# Patient Record
Sex: Male | Born: 1970 | Race: Black or African American | Hispanic: No | Marital: Single | Smoking: Never smoker
Health system: Southern US, Community
[De-identification: ages and names within clinical notes are randomized; demographics above are authoritative.]

## PROBLEM LIST (undated history)

## (undated) DIAGNOSIS — B1910 Unspecified viral hepatitis B without hepatic coma: Secondary | ICD-10-CM

## (undated) DIAGNOSIS — A539 Syphilis, unspecified: Secondary | ICD-10-CM

## (undated) DIAGNOSIS — E785 Hyperlipidemia, unspecified: Secondary | ICD-10-CM

## (undated) DIAGNOSIS — F329 Major depressive disorder, single episode, unspecified: Secondary | ICD-10-CM

## (undated) DIAGNOSIS — B20 Human immunodeficiency virus [HIV] disease: Secondary | ICD-10-CM

## (undated) DIAGNOSIS — F32A Depression, unspecified: Secondary | ICD-10-CM

## (undated) DIAGNOSIS — E119 Type 2 diabetes mellitus without complications: Secondary | ICD-10-CM

## (undated) DIAGNOSIS — I10 Essential (primary) hypertension: Secondary | ICD-10-CM

## (undated) DIAGNOSIS — Z21 Asymptomatic human immunodeficiency virus [HIV] infection status: Secondary | ICD-10-CM

## (undated) HISTORY — DX: Depression, unspecified: F32.A

## (undated) HISTORY — DX: Major depressive disorder, single episode, unspecified: F32.9

## (undated) HISTORY — DX: Human immunodeficiency virus (HIV) disease: B20

## (undated) HISTORY — DX: Unspecified viral hepatitis B without hepatic coma: B19.10

## (undated) HISTORY — DX: Essential (primary) hypertension: I10

## (undated) HISTORY — DX: Hyperlipidemia, unspecified: E78.5

## (undated) HISTORY — DX: Asymptomatic human immunodeficiency virus (hiv) infection status: Z21

## (undated) HISTORY — DX: Syphilis, unspecified: A53.9

---

## 1997-05-17 ENCOUNTER — Encounter (INDEPENDENT_AMBULATORY_CARE_PROVIDER_SITE_OTHER): Payer: Self-pay | Admitting: *Deleted

## 1997-05-17 LAB — CONVERTED CEMR LAB: CD4 Count: 520 microliters

## 1997-08-16 ENCOUNTER — Encounter: Admission: RE | Admit: 1997-08-16 | Discharge: 1997-08-16 | Payer: Self-pay | Admitting: Internal Medicine

## 1997-10-25 ENCOUNTER — Encounter: Admission: RE | Admit: 1997-10-25 | Discharge: 1997-10-25 | Payer: Self-pay | Admitting: Hematology and Oncology

## 1998-02-01 ENCOUNTER — Encounter: Admission: RE | Admit: 1998-02-01 | Discharge: 1998-02-01 | Payer: Self-pay | Admitting: Internal Medicine

## 1998-08-16 DIAGNOSIS — E1065 Type 1 diabetes mellitus with hyperglycemia: Secondary | ICD-10-CM

## 1998-08-16 DIAGNOSIS — E108 Type 1 diabetes mellitus with unspecified complications: Secondary | ICD-10-CM

## 1998-08-23 ENCOUNTER — Ambulatory Visit (HOSPITAL_COMMUNITY): Admission: RE | Admit: 1998-08-23 | Discharge: 1998-08-23 | Payer: Self-pay | Admitting: Internal Medicine

## 1998-08-23 ENCOUNTER — Encounter: Admission: RE | Admit: 1998-08-23 | Discharge: 1998-08-23 | Payer: Self-pay | Admitting: Internal Medicine

## 1998-10-01 ENCOUNTER — Emergency Department (HOSPITAL_COMMUNITY): Admission: EM | Admit: 1998-10-01 | Discharge: 1998-10-01 | Payer: Self-pay | Admitting: Emergency Medicine

## 2000-01-16 ENCOUNTER — Ambulatory Visit (HOSPITAL_COMMUNITY): Admission: RE | Admit: 2000-01-16 | Discharge: 2000-01-16 | Payer: Self-pay | Admitting: Hematology and Oncology

## 2000-01-16 ENCOUNTER — Encounter: Admission: RE | Admit: 2000-01-16 | Discharge: 2000-01-16 | Payer: Self-pay

## 2000-01-30 ENCOUNTER — Encounter: Admission: RE | Admit: 2000-01-30 | Discharge: 2000-01-30 | Payer: Self-pay | Admitting: Hematology and Oncology

## 2000-03-05 ENCOUNTER — Encounter: Admission: RE | Admit: 2000-03-05 | Discharge: 2000-03-05 | Payer: Self-pay | Admitting: Internal Medicine

## 2000-05-18 ENCOUNTER — Encounter: Admission: RE | Admit: 2000-05-18 | Discharge: 2000-05-18 | Payer: Self-pay | Admitting: Internal Medicine

## 2000-05-18 ENCOUNTER — Ambulatory Visit (HOSPITAL_COMMUNITY): Admission: RE | Admit: 2000-05-18 | Discharge: 2000-05-18 | Payer: Self-pay | Admitting: Internal Medicine

## 2000-10-30 ENCOUNTER — Encounter: Payer: Self-pay | Admitting: Infectious Diseases

## 2000-10-30 ENCOUNTER — Encounter: Admission: RE | Admit: 2000-10-30 | Discharge: 2000-10-30 | Payer: Self-pay | Admitting: Internal Medicine

## 2000-10-30 ENCOUNTER — Ambulatory Visit (HOSPITAL_COMMUNITY): Admission: RE | Admit: 2000-10-30 | Discharge: 2000-10-30 | Payer: Self-pay | Admitting: Internal Medicine

## 2001-03-31 ENCOUNTER — Encounter: Admission: RE | Admit: 2001-03-31 | Discharge: 2001-03-31 | Payer: Self-pay

## 2001-03-31 ENCOUNTER — Ambulatory Visit (HOSPITAL_COMMUNITY): Admission: RE | Admit: 2001-03-31 | Discharge: 2001-03-31 | Payer: Self-pay

## 2001-03-31 ENCOUNTER — Encounter: Payer: Self-pay | Admitting: Infectious Diseases

## 2001-12-03 ENCOUNTER — Encounter: Admission: RE | Admit: 2001-12-03 | Discharge: 2001-12-03 | Payer: Self-pay | Admitting: Internal Medicine

## 2001-12-15 ENCOUNTER — Encounter: Admission: RE | Admit: 2001-12-15 | Discharge: 2001-12-15 | Payer: Self-pay | Admitting: Internal Medicine

## 2001-12-15 ENCOUNTER — Ambulatory Visit (HOSPITAL_COMMUNITY): Admission: RE | Admit: 2001-12-15 | Discharge: 2001-12-15 | Payer: Self-pay | Admitting: Internal Medicine

## 2002-05-03 ENCOUNTER — Encounter: Admission: RE | Admit: 2002-05-03 | Discharge: 2002-05-03 | Payer: Self-pay | Admitting: Internal Medicine

## 2002-05-09 ENCOUNTER — Ambulatory Visit (HOSPITAL_COMMUNITY): Admission: RE | Admit: 2002-05-09 | Discharge: 2002-05-09 | Payer: Self-pay | Admitting: *Deleted

## 2002-05-09 ENCOUNTER — Encounter: Admission: RE | Admit: 2002-05-09 | Discharge: 2002-05-09 | Payer: Self-pay | Admitting: Internal Medicine

## 2002-05-09 ENCOUNTER — Encounter: Payer: Self-pay | Admitting: *Deleted

## 2002-06-28 ENCOUNTER — Encounter: Admission: RE | Admit: 2002-06-28 | Discharge: 2002-06-28 | Payer: Self-pay | Admitting: Internal Medicine

## 2002-07-04 ENCOUNTER — Emergency Department (HOSPITAL_COMMUNITY): Admission: EM | Admit: 2002-07-04 | Discharge: 2002-07-04 | Payer: Self-pay | Admitting: Emergency Medicine

## 2002-07-12 ENCOUNTER — Encounter: Admission: RE | Admit: 2002-07-12 | Discharge: 2002-07-12 | Payer: Self-pay | Admitting: Internal Medicine

## 2002-07-15 ENCOUNTER — Encounter: Payer: Self-pay | Admitting: Internal Medicine

## 2002-07-15 ENCOUNTER — Ambulatory Visit (HOSPITAL_COMMUNITY): Admission: RE | Admit: 2002-07-15 | Discharge: 2002-07-15 | Payer: Self-pay | Admitting: Internal Medicine

## 2002-07-19 ENCOUNTER — Encounter: Admission: RE | Admit: 2002-07-19 | Discharge: 2002-07-19 | Payer: Self-pay | Admitting: Internal Medicine

## 2002-07-19 ENCOUNTER — Encounter: Admission: RE | Admit: 2002-07-19 | Discharge: 2002-10-17 | Payer: Self-pay | Admitting: *Deleted

## 2002-08-03 ENCOUNTER — Encounter: Admission: RE | Admit: 2002-08-03 | Discharge: 2002-08-03 | Payer: Self-pay | Admitting: Internal Medicine

## 2002-09-07 ENCOUNTER — Encounter: Admission: RE | Admit: 2002-09-07 | Discharge: 2002-09-07 | Payer: Self-pay | Admitting: Infectious Diseases

## 2002-09-07 ENCOUNTER — Encounter: Payer: Self-pay | Admitting: Infectious Diseases

## 2002-10-05 ENCOUNTER — Ambulatory Visit (HOSPITAL_COMMUNITY): Admission: RE | Admit: 2002-10-05 | Discharge: 2002-10-05 | Payer: Self-pay | Admitting: *Deleted

## 2002-10-05 ENCOUNTER — Encounter (INDEPENDENT_AMBULATORY_CARE_PROVIDER_SITE_OTHER): Payer: Self-pay | Admitting: *Deleted

## 2003-01-04 ENCOUNTER — Encounter: Admission: RE | Admit: 2003-01-04 | Discharge: 2003-01-04 | Payer: Self-pay | Admitting: Infectious Diseases

## 2003-01-04 ENCOUNTER — Ambulatory Visit (HOSPITAL_COMMUNITY): Admission: RE | Admit: 2003-01-04 | Discharge: 2003-01-04 | Payer: Self-pay | Admitting: Infectious Diseases

## 2003-01-04 ENCOUNTER — Encounter: Payer: Self-pay | Admitting: Infectious Diseases

## 2003-03-03 ENCOUNTER — Encounter: Admission: RE | Admit: 2003-03-03 | Discharge: 2003-03-03 | Payer: Self-pay | Admitting: Internal Medicine

## 2003-06-07 ENCOUNTER — Encounter: Admission: RE | Admit: 2003-06-07 | Discharge: 2003-06-07 | Payer: Self-pay | Admitting: Internal Medicine

## 2003-07-17 ENCOUNTER — Encounter: Admission: RE | Admit: 2003-07-17 | Discharge: 2003-07-17 | Payer: Self-pay | Admitting: Infectious Diseases

## 2003-07-17 ENCOUNTER — Ambulatory Visit (HOSPITAL_COMMUNITY): Admission: RE | Admit: 2003-07-17 | Discharge: 2003-07-17 | Payer: Self-pay | Admitting: Infectious Diseases

## 2004-02-05 ENCOUNTER — Ambulatory Visit: Payer: Self-pay | Admitting: Infectious Diseases

## 2004-02-05 ENCOUNTER — Ambulatory Visit (HOSPITAL_COMMUNITY): Admission: RE | Admit: 2004-02-05 | Discharge: 2004-02-05 | Payer: Self-pay | Admitting: Infectious Diseases

## 2004-04-08 ENCOUNTER — Encounter (INDEPENDENT_AMBULATORY_CARE_PROVIDER_SITE_OTHER): Payer: Self-pay | Admitting: *Deleted

## 2004-04-08 LAB — CONVERTED CEMR LAB: HIV 1 RNA Quant: 399 copies/mL

## 2004-05-09 ENCOUNTER — Ambulatory Visit (HOSPITAL_COMMUNITY): Admission: RE | Admit: 2004-05-09 | Discharge: 2004-05-09 | Payer: Self-pay | Admitting: Internal Medicine

## 2004-05-09 ENCOUNTER — Encounter: Payer: Self-pay | Admitting: Infectious Diseases

## 2004-05-09 ENCOUNTER — Ambulatory Visit: Payer: Self-pay | Admitting: Internal Medicine

## 2004-09-15 ENCOUNTER — Emergency Department (HOSPITAL_COMMUNITY): Admission: EM | Admit: 2004-09-15 | Discharge: 2004-09-15 | Payer: Self-pay | Admitting: Emergency Medicine

## 2005-03-03 ENCOUNTER — Emergency Department (HOSPITAL_COMMUNITY): Admission: EM | Admit: 2005-03-03 | Discharge: 2005-03-04 | Payer: Self-pay | Admitting: Emergency Medicine

## 2005-10-06 ENCOUNTER — Encounter (INDEPENDENT_AMBULATORY_CARE_PROVIDER_SITE_OTHER): Payer: Self-pay | Admitting: *Deleted

## 2005-10-06 ENCOUNTER — Ambulatory Visit: Payer: Self-pay | Admitting: Infectious Diseases

## 2005-10-06 ENCOUNTER — Encounter: Admission: RE | Admit: 2005-10-06 | Discharge: 2005-10-06 | Payer: Self-pay | Admitting: Infectious Diseases

## 2005-10-06 LAB — CONVERTED CEMR LAB
CD4 Count: 500 microliters
HIV 1 RNA Quant: 399 copies/mL

## 2006-02-23 ENCOUNTER — Ambulatory Visit: Payer: Self-pay | Admitting: Infectious Diseases

## 2006-02-23 ENCOUNTER — Encounter (INDEPENDENT_AMBULATORY_CARE_PROVIDER_SITE_OTHER): Payer: Self-pay | Admitting: *Deleted

## 2006-02-23 ENCOUNTER — Encounter: Admission: RE | Admit: 2006-02-23 | Discharge: 2006-02-23 | Payer: Self-pay | Admitting: Infectious Diseases

## 2006-02-23 LAB — CONVERTED CEMR LAB
CD4 Count: 650 microliters
HIV 1 RNA Quant: 49 copies/mL

## 2006-02-24 DIAGNOSIS — Z8619 Personal history of other infectious and parasitic diseases: Secondary | ICD-10-CM

## 2006-02-24 DIAGNOSIS — I10 Essential (primary) hypertension: Secondary | ICD-10-CM | POA: Insufficient documentation

## 2006-02-24 DIAGNOSIS — E785 Hyperlipidemia, unspecified: Secondary | ICD-10-CM | POA: Insufficient documentation

## 2006-02-24 DIAGNOSIS — B20 Human immunodeficiency virus [HIV] disease: Secondary | ICD-10-CM | POA: Insufficient documentation

## 2006-05-11 ENCOUNTER — Encounter (INDEPENDENT_AMBULATORY_CARE_PROVIDER_SITE_OTHER): Payer: Self-pay | Admitting: *Deleted

## 2006-05-11 LAB — CONVERTED CEMR LAB

## 2006-05-24 ENCOUNTER — Encounter (INDEPENDENT_AMBULATORY_CARE_PROVIDER_SITE_OTHER): Payer: Self-pay | Admitting: *Deleted

## 2007-01-04 ENCOUNTER — Telehealth: Payer: Self-pay | Admitting: Infectious Diseases

## 2007-03-16 ENCOUNTER — Telehealth: Payer: Self-pay | Admitting: Infectious Diseases

## 2007-04-01 ENCOUNTER — Encounter: Admission: RE | Admit: 2007-04-01 | Discharge: 2007-04-01 | Payer: Self-pay | Admitting: Infectious Diseases

## 2007-04-01 ENCOUNTER — Ambulatory Visit: Payer: Self-pay | Admitting: Infectious Diseases

## 2007-04-01 LAB — CONVERTED CEMR LAB
AST: 49 units/L — ABNORMAL HIGH (ref 0–37)
BUN: 10 mg/dL (ref 6–23)
Basophils Absolute: 0 10*3/uL (ref 0.0–0.1)
Basophils Relative: 0 % (ref 0–1)
Calcium: 9 mg/dL (ref 8.4–10.5)
Chloride: 100 meq/L (ref 96–112)
Cholesterol: 162 mg/dL (ref 0–200)
Creatinine, Ser: 1.08 mg/dL (ref 0.40–1.50)
Eosinophils Relative: 2 % (ref 0–5)
HCT: 42.1 % (ref 39.0–52.0)
HDL: 37 mg/dL — ABNORMAL LOW (ref >39)
Hemoglobin: 13.3 g/dL (ref 13.0–17.0)
MCHC: 31.6 g/dL (ref 30.0–36.0)
MCV: 77.2 fL — ABNORMAL LOW (ref 78.0–100.0)
Monocytes Absolute: 0.4 10*3/uL (ref 0.1–1.0)
Monocytes Relative: 8 % (ref 3–12)
Neutro Abs: 2.3 10*3/uL (ref 1.7–7.7)
RBC: 5.45 M/uL (ref 4.22–5.81)
RDW: 14.4 % (ref 11.5–15.5)
Total Bilirubin: 0.3 mg/dL (ref 0.3–1.2)
Total CHOL/HDL Ratio: 4.4
VLDL: 78 mg/dL — ABNORMAL HIGH (ref 0–40)

## 2007-04-05 ENCOUNTER — Telehealth: Payer: Self-pay | Admitting: Infectious Diseases

## 2007-04-07 ENCOUNTER — Ambulatory Visit: Payer: Self-pay | Admitting: Internal Medicine

## 2007-04-07 DIAGNOSIS — B009 Herpesviral infection, unspecified: Secondary | ICD-10-CM | POA: Insufficient documentation

## 2007-05-03 ENCOUNTER — Encounter: Payer: Self-pay | Admitting: Infectious Diseases

## 2007-05-06 ENCOUNTER — Telehealth: Payer: Self-pay | Admitting: Internal Medicine

## 2007-07-20 ENCOUNTER — Telehealth: Payer: Self-pay | Admitting: Infectious Diseases

## 2007-09-24 ENCOUNTER — Encounter: Admission: RE | Admit: 2007-09-24 | Discharge: 2007-09-24 | Payer: Self-pay | Admitting: Infectious Diseases

## 2007-09-24 ENCOUNTER — Ambulatory Visit: Payer: Self-pay | Admitting: Infectious Diseases

## 2007-09-24 LAB — CONVERTED CEMR LAB
Albumin: 4.4 g/dL (ref 3.5–5.2)
Alkaline Phosphatase: 81 units/L (ref 39–117)
BUN: 12 mg/dL (ref 6–23)
Basophils Relative: 0 % (ref 0–1)
Creatinine, Ser: 0.83 mg/dL (ref 0.40–1.50)
Eosinophils Absolute: 0.1 10*3/uL (ref 0.0–0.7)
Eosinophils Relative: 2 % (ref 0–5)
Glucose, Bld: 265 mg/dL — ABNORMAL HIGH (ref 70–99)
HCT: 42.4 % (ref 39.0–52.0)
HIV-1 RNA Quant, Log: <1.7 (ref <1.70)
Lymphs Abs: 2.1 10*3/uL (ref 0.7–4.0)
MCHC: 31.8 g/dL (ref 30.0–36.0)
MCV: 77.2 fL — ABNORMAL LOW (ref 78.0–100.0)
Monocytes Absolute: 0.4 10*3/uL (ref 0.1–1.0)
Monocytes Relative: 8 % (ref 3–12)
RBC: 5.49 M/uL (ref 4.22–5.81)
Total Bilirubin: 0.4 mg/dL (ref 0.3–1.2)
WBC: 4.7 10*3/uL (ref 4.0–10.5)

## 2007-09-27 ENCOUNTER — Ambulatory Visit: Payer: Self-pay | Admitting: Infectious Diseases

## 2007-12-07 ENCOUNTER — Telehealth: Payer: Self-pay | Admitting: Infectious Diseases

## 2007-12-08 ENCOUNTER — Telehealth: Payer: Self-pay | Admitting: Infectious Diseases

## 2007-12-08 ENCOUNTER — Encounter: Payer: Self-pay | Admitting: Infectious Diseases

## 2008-02-09 ENCOUNTER — Ambulatory Visit: Payer: Self-pay | Admitting: Infectious Diseases

## 2008-02-09 LAB — CONVERTED CEMR LAB
Albumin: 4.3 g/dL (ref 3.5–5.2)
CO2: 23 meq/L (ref 19–32)
Cholesterol: 129 mg/dL (ref 0–200)
Eosinophils Relative: 1 % (ref 0–5)
Glucose, Bld: 227 mg/dL — ABNORMAL HIGH (ref 70–99)
HCT: 44.2 % (ref 39.0–52.0)
Hemoglobin: 14.2 g/dL (ref 13.0–17.0)
Lymphocytes Relative: 24 % (ref 12–46)
Lymphs Abs: 1.6 10*3/uL (ref 0.7–4.0)
Monocytes Relative: 5 % (ref 3–12)
Platelets: 198 10*3/uL (ref 150–400)
RBC: 5.57 M/uL (ref 4.22–5.81)
Sodium: 137 meq/L (ref 135–145)
Total Bilirubin: 0.5 mg/dL (ref 0.3–1.2)
Total Protein: 7.4 g/dL (ref 6.0–8.3)
Triglycerides: 74 mg/dL (ref <150)
VLDL: 15 mg/dL (ref 0–40)
WBC: 6.6 10*3/uL (ref 4.0–10.5)

## 2008-03-22 ENCOUNTER — Ambulatory Visit: Payer: Self-pay | Admitting: Infectious Diseases

## 2008-03-22 LAB — CONVERTED CEMR LAB
Bilirubin Urine: NEGATIVE
Blood Glucose, Fingerstick: 513
Blood in Urine, dipstick: NEGATIVE
Glucose, Urine, Semiquant: >=1000
Hgb A1c MFr Bld: 11.3 %
Protein, U semiquant: NEGATIVE

## 2008-06-14 ENCOUNTER — Telehealth: Payer: Self-pay | Admitting: Infectious Diseases

## 2008-08-29 ENCOUNTER — Ambulatory Visit: Payer: Self-pay | Admitting: Infectious Diseases

## 2008-08-29 ENCOUNTER — Encounter: Payer: Self-pay | Admitting: Internal Medicine

## 2008-08-29 LAB — CONVERTED CEMR LAB
ALT: 118 units/L — ABNORMAL HIGH (ref 0–53)
AST: 83 units/L — ABNORMAL HIGH (ref 0–37)
Albumin: 4.1 g/dL (ref 3.5–5.2)
Basophils Absolute: 0 10*3/uL (ref 0.0–0.1)
Calcium: 9.1 mg/dL (ref 8.4–10.5)
Chloride: 105 meq/L (ref 96–112)
HIV 1 RNA Quant: <48 copies/mL (ref <48)
HIV-1 RNA Quant, Log: <1.68 (ref <1.68)
Lymphocytes Relative: 39 % (ref 12–46)
Lymphs Abs: 1.7 10*3/uL (ref 0.7–4.0)
Monocytes Absolute: 0.2 10*3/uL (ref 0.1–1.0)
Neutro Abs: 2.2 10*3/uL (ref 1.7–7.7)
Potassium: 4 meq/L (ref 3.5–5.3)
RBC: 5.3 M/uL (ref 4.22–5.81)
RDW: 14.6 % (ref 11.5–15.5)

## 2008-09-12 ENCOUNTER — Ambulatory Visit: Payer: Self-pay | Admitting: Infectious Diseases

## 2008-10-12 ENCOUNTER — Ambulatory Visit: Payer: Self-pay | Admitting: Internal Medicine

## 2008-10-12 LAB — CONVERTED CEMR LAB: Hgb A1c MFr Bld: 10.1 %

## 2008-11-03 ENCOUNTER — Ambulatory Visit: Payer: Self-pay | Admitting: Internal Medicine

## 2008-11-03 LAB — CONVERTED CEMR LAB

## 2009-01-23 ENCOUNTER — Telehealth: Payer: Self-pay | Admitting: *Deleted

## 2009-01-29 ENCOUNTER — Ambulatory Visit: Payer: Self-pay | Admitting: Infectious Diseases

## 2009-01-29 LAB — CONVERTED CEMR LAB
Alkaline Phosphatase: 98 units/L (ref 39–117)
BUN: 8 mg/dL (ref 6–23)
Basophils Absolute: 0 10*3/uL (ref 0.0–0.1)
Basophils Relative: 0 % (ref 0–1)
Creatinine, Ser: 0.93 mg/dL (ref 0.40–1.50)
Eosinophils Relative: 2 % (ref 0–5)
Glucose, Bld: 329 mg/dL — ABNORMAL HIGH (ref 70–99)
HCT: 43.7 % (ref 39.0–52.0)
HIV 1 RNA Quant: <48 copies/mL (ref <48)
HIV-1 RNA Quant, Log: <1.68 (ref <1.68)
Hemoglobin: 14 g/dL (ref 13.0–17.0)
MCHC: 32 g/dL (ref 30.0–36.0)
Monocytes Absolute: 0.4 10*3/uL (ref 0.1–1.0)
Platelets: 198 10*3/uL (ref 150–400)
RDW: 14.4 % (ref 11.5–15.5)
Total Bilirubin: 0.4 mg/dL (ref 0.3–1.2)

## 2009-01-31 ENCOUNTER — Telehealth: Payer: Self-pay | Admitting: Infectious Diseases

## 2009-01-31 ENCOUNTER — Telehealth (INDEPENDENT_AMBULATORY_CARE_PROVIDER_SITE_OTHER): Payer: Self-pay | Admitting: *Deleted

## 2009-04-18 ENCOUNTER — Ambulatory Visit: Payer: Self-pay | Admitting: Internal Medicine

## 2009-04-18 ENCOUNTER — Telehealth: Payer: Self-pay | Admitting: *Deleted

## 2009-04-18 DIAGNOSIS — R209 Unspecified disturbances of skin sensation: Secondary | ICD-10-CM

## 2009-04-18 LAB — CONVERTED CEMR LAB
Albumin: 4.1 g/dL (ref 3.5–5.2)
Alkaline Phosphatase: 77 units/L (ref 39–117)
BUN: 10 mg/dL (ref 6–23)
Blood Glucose, Fingerstick: 302
Glucose, Bld: 391 mg/dL — ABNORMAL HIGH (ref 70–99)
Hgb A1c MFr Bld: 13.1 %
Microalb, Ur: 2.3 mg/dL — ABNORMAL HIGH (ref 0.00–1.89)
Potassium: 4.1 meq/L (ref 3.5–5.3)

## 2009-05-21 ENCOUNTER — Telehealth (INDEPENDENT_AMBULATORY_CARE_PROVIDER_SITE_OTHER): Payer: Self-pay | Admitting: *Deleted

## 2009-06-19 ENCOUNTER — Encounter: Payer: Self-pay | Admitting: Infectious Diseases

## 2009-06-23 ENCOUNTER — Encounter: Payer: Self-pay | Admitting: Internal Medicine

## 2009-07-04 ENCOUNTER — Encounter: Payer: Self-pay | Admitting: Internal Medicine

## 2009-07-04 ENCOUNTER — Ambulatory Visit: Payer: Self-pay | Admitting: Infectious Diseases

## 2009-07-04 LAB — CONVERTED CEMR LAB
ALT: 93 units/L — ABNORMAL HIGH (ref 0–53)
AST: 68 units/L — ABNORMAL HIGH (ref 0–37)
Albumin: 4.7 g/dL (ref 3.5–5.2)
BUN: 11 mg/dL (ref 6–23)
Basophils Absolute: 0 10*3/uL (ref 0.0–0.1)
Basophils Relative: 1 % (ref 0–1)
Calcium: 9.6 mg/dL (ref 8.4–10.5)
Chloride: 101 meq/L (ref 96–112)
Eosinophils Absolute: 0.1 10*3/uL (ref 0.0–0.7)
HIV-1 RNA Quant, Log: <1.68 (ref <1.68)
Hgb A1c MFr Bld: 13.5 % — ABNORMAL HIGH (ref <5.7)
MCHC: 31.9 g/dL (ref 30.0–36.0)
MCV: 80.3 fL (ref 78.0–100.0)
Neutrophils Relative %: 60 % (ref 43–77)
Platelets: 189 10*3/uL (ref 150–400)
Potassium: 4 meq/L (ref 3.5–5.3)
WBC: 5.3 10*3/uL (ref 4.0–10.5)

## 2009-12-28 ENCOUNTER — Encounter: Payer: Self-pay | Admitting: Infectious Diseases

## 2009-12-31 ENCOUNTER — Encounter: Payer: Self-pay | Admitting: Internal Medicine

## 2009-12-31 ENCOUNTER — Ambulatory Visit: Payer: Self-pay | Admitting: Infectious Diseases

## 2009-12-31 LAB — CONVERTED CEMR LAB: HIV 1 RNA Quant: 21 copies/mL (ref <20)

## 2010-01-08 ENCOUNTER — Telehealth: Payer: Self-pay | Admitting: *Deleted

## 2010-01-08 LAB — CONVERTED CEMR LAB
ALT: 30 units/L (ref 0–53)
Albumin: 4.6 g/dL (ref 3.5–5.2)
Alkaline Phosphatase: 67 units/L (ref 39–117)
Basophils Absolute: 0 10*3/uL (ref 0.0–0.1)
CO2: 23 meq/L (ref 19–32)
Eosinophils Absolute: 0.1 10*3/uL (ref 0.0–0.7)
Glucose, Bld: 365 mg/dL — ABNORMAL HIGH (ref 70–99)
Lymphocytes Relative: 39 % (ref 12–46)
Lymphs Abs: 2.7 10*3/uL (ref 0.7–4.0)
Neutrophils Relative %: 53 % (ref 43–77)
Platelets: 200 10*3/uL (ref 150–400)
Potassium: 3.8 meq/L (ref 3.5–5.3)
Sodium: 138 meq/L (ref 135–145)
Total Protein: 7.4 g/dL (ref 6.0–8.3)
WBC: 6.9 10*3/uL (ref 4.0–10.5)

## 2010-01-16 ENCOUNTER — Ambulatory Visit: Payer: Self-pay | Admitting: Infectious Diseases

## 2010-01-16 LAB — HM DIABETES FOOT EXAM

## 2010-03-05 ENCOUNTER — Telehealth: Payer: Self-pay | Admitting: Infectious Diseases

## 2010-04-11 ENCOUNTER — Encounter (INDEPENDENT_AMBULATORY_CARE_PROVIDER_SITE_OTHER): Payer: Self-pay | Admitting: *Deleted

## 2010-04-14 LAB — CONVERTED CEMR LAB
ALT: 25 units/L (ref 0–53)
AST: 20 units/L (ref 0–37)
Albumin: 4.8 g/dL (ref 3.5–5.2)
Alkaline Phosphatase: 53 units/L (ref 39–117)
BUN: 11 mg/dL (ref 6–23)
CO2: 24 meq/L (ref 19–32)
Calcium: 9.2 mg/dL (ref 8.4–10.5)
Chloride: 104 meq/L (ref 96–112)
Creatinine, Ser: 0.93 mg/dL (ref 0.40–1.50)
HCT: 42.2 % (ref 41.0–49.0)
HIV 1 RNA Quant: <50 copies/mL (ref <50)
HIV-1 RNA Quant, Log: <1.7 (ref <1.70)
Hemoglobin: 13.5 g/dL — ABNORMAL LOW (ref 13.9–16.8)
MCHC: 32 g/dL — ABNORMAL LOW (ref 33.1–35.4)
MCV: 77.4 fL — ABNORMAL LOW (ref 78.8–100.0)
Platelets: 324 10*3/uL (ref 152–374)
Potassium: 3.7 meq/L (ref 3.5–5.3)
RBC: 5.45 M/uL (ref 4.20–5.50)
RDW: 14.5 % (ref 11.5–15.3)
Total Protein: 8.2 g/dL (ref 6.0–8.3)
WBC: 6.9 10*3/uL (ref 3.7–10.0)

## 2010-04-16 NOTE — Letter (Signed)
Summary: [facility]  [facility]   Imported By: Margie Billet 07/02/2009 14:41:46  _____________________________________________________________________  External Attachment:    Type:   Image     Comment:   External Document

## 2010-04-16 NOTE — Assessment & Plan Note (Signed)
Summary: 9month f/u [mkj]   Primary Provider:  [name]  CC:  follow-up visit.  History of Present Illness: 40 y/o HIV+  M  with DM, HTN and HLD. CD4 820 and VL 21 (Oct 17,2011). on atripla. HgAIC 13.4%. Did not take any of his meds yet today. BP elavated now. FSGs have been 160s, states he has not been checking them as often as he should. Has otherwise been feeling well.   Preventive Screening-Counseling & Management  Alcohol-Tobacco     Alcohol drinks/day: 0     Smoking Status: never  Caffeine-Diet-Exercise     Caffeine use/day: sodas     Does Patient Exercise: yes     Type of exercise: push ups and sit ups     Exercise (avg: min/session): 30-60     Times/week: <3  Hep-HIV-STD-Contraception     HIV Risk: no risk noted     HIV Risk Counseling: not indicated-no HIV risk noted  Safety-Violence-Falls     Seat Belt Use: yes  Comments: declined condoms      Sexual History:  n/a.        Drug Use:  never.     Updated Prior Medication List: LIPITOR 10 MG TABS (ATORVASTATIN CALCIUM) take one tablet at bedtime LISINOPRIL 40 MG TABS (LISINOPRIL) take one tablet daily GLIPIZIDE 10 MG TABS (GLIPIZIDE) take one tablet daily ACTOS 15 MG TABS (PIOGLITAZONE HCL) Take 1 tablet by mouth once a day ATRIPLA 600-200-300 MG TABS (EFAVIRENZ-EMTRICITAB-TENOFOVIR) Take 1 tablet by mouth once a day NEURONTIN 100 MG CAPS (GABAPENTIN) Take 1 tablet by mouth three times a day METFORMIN HCL 500 MG TABS (METFORMIN HCL) Take 1 tablet once daily for one week. Increase it to 1 tablet twice a day if you are not having diarrhea or belly pain  Current Allergies (reviewed today): No known allergies  Past History:  Past medical, surgical, family and social histories (including risk factors) reviewed, and no changes noted (except as noted below).  Past Medical History: Reviewed history from 03/22/2008 and no changes required. Diabetes mellitus, type II 2006 Hepatitis B, hx of HIV  disease Hyperlipidemia Hypertension Syphillis  Family History: Reviewed history from 03/22/2008 and no changes required. Family History Diabetes 1st degree relative Family History Kidney disease grandfather with MI  Social History: Reviewed history from 03/22/2008 and no changes required. Single Never Smoked Alcohol use-no Drug use-no Sexual History:  n/a Drug Use:  never  Review of Systems       has only been taking metformin once daily due to GI upset. Has not had ophtho eval this year. has gaine 10#. doing exercise at home. walks alot at work.   Vital Signs:  Patient profile:   40 year old male Height:      72 inches (182.88 cm) Weight:      223 pounds (101.36 kg) BMI:     30.35 Temp:     98.0 degrees F (36.67 degrees C) 0 Pulse rate:   75 / minute BP sitting:   149 / 102  (left arm) Cuff size:   large  Vitals Entered By: Jennet Maduro RN (January 16, 2010 3:18 PM) CC: follow-up visit Is Patient Diabetic? Yes Did you bring your meter with you today? not checked this am Pain Assessment Patient in pain? no      Nutritional Status BMI of 25 - 29 = overweight Nutritional Status Detail appetite "good"  Have you ever been in a relationship where you felt threatened, hurt or  afraid?No   Does patient need assistance? Functional Status Self care Ambulation Normal Comments no missed doses   Physical Exam  General:  well-developed, well-nourished, and well-hydrated.   Eyes:  pupils equal, pupils round, and pupils reactive to light.   Mouth:  pharynx pink and moist and no exudates.   Neck:  no masses.   Lungs:  normal respiratory effort and normal breath sounds.   Heart:  normal rate, regular rhythm, and no murmur.   Abdomen:  soft, non-tender, and normal bowel sounds.   Pulses:  R and L dorsalis pedis normal.   Neurologic:  sensation intact to light touch.    Diabetes Management Exam:    Foot Exam (with socks and/or shoes not present):        Sensory-Pinprick/Light touch:          Left medial foot (L-4): normal          Left dorsal foot (L-5): normal          Left lateral foot (S-1): normal          Right medial foot (L-4): normal          Right dorsal foot (L-5): normal          Right lateral foot (S-1): normal       Inspection:          Left foot: normal          Right foot: normal        Medication Adherence: 01/16/2010   Adherence to medications reviewed with patient. Counseling to provide adequate adherence provided   Prevention For Positives: 01/16/2010   Safe sex practices discussed with patient. Condoms offered.                             Impression & Recommendations:  Problem # 1:  HIV INFECTION (ICD-042)  he is doing well with regards to his HV. he is offered condoms (refuses). Gets flu shot today. will see him back in 4-5 months.   Orders: Ophthalmology Referral (Ophthalmology) Est. Patient Level IV (99214)Future Orders: T-CD4SP ([facility]) (CD4SP) ... 04/16/2010 T-HIV Viral Load [phone]) ... 04/16/2010 T-Comprehensive Metabolic Panel [id number]) ... 04/16/2010 T-CBC w/Diff ([id number]) ... 04/16/2010 T-RPR (Syphilis) [phone]) ... 04/16/2010 T-Lipid Profile [phone]) ... 04/16/2010  Problem # 2:  HYPERTENSION (ICD-401.9) encouraged him to take his meds.  His updated medication list for this problem includes:    Lisinopril 40 Mg Tabs (Lisinopril) .Marland Kitchen... Take one tablet daily  Problem # 3:  DIABETES MELLITUS, TYPE II (ICD-250.00)  will refer him for eye exam today. he is aware that he needs to do a better job controlling his DM. will get him back into IM clinic. He asks about a PSA today- I suggested we defer this til he is 49 or 59 and explained that even then the data is unclear.  His updated medication list for this problem includes:    Lisinopril 40 Mg Tabs (Lisinopril) .Marland Kitchen... Take one tablet daily    Glipizide 10 Mg Tabs (Glipizide) .Marland Kitchen... Take one tablet daily    Actos 15  Mg Tabs (Pioglitazone hcl) .Marland Kitchen... Take 1 tablet by mouth once a day    Metformin Hcl 500 Mg Tabs (Metformin hcl) .Marland Kitchen... Take 1 tablet once daily for one week. increase it to 1 tablet twice a day if you are not having diarrhea or belly pain  Orders: Ophthalmology Referral (Ophthalmology)  Other Orders: Influenza Vaccine  NON MCR (62952)         Medication Adherence: 01/16/2010   Adherence to medications reviewed with patient. Counseling to provide adequate adherence provided    Prevention For Positives: 01/16/2010   Safe sex practices discussed with patient. Condoms offered.                            Process Orders Check Orders Results:     [facility]: ABN not required for this insurance Tests Sent for requisitioning (January 16, 2010 4:12 PM):     04/16/2010: [facility] -- T-HIV Viral Load [phone] (signed)     04/16/2010: [facility] -- T-Comprehensive Metabolic Panel [[id number]] (signed)     04/16/2010: [facility] -- T-CBC w/Diff [[id number]] (signed)     04/16/2010: [facility] -- T-RPR (Syphilis) [phone] (signed)     04/16/2010: [facility] -- T-Lipid Profile [phone] (signed)     Influenza Vaccine    Vaccine Type: Fluvax Non-MCR    Site: left deltoid    Mfr: novartis    Dose: 0.5 ml    Route: IM    Given by: Jennet Maduro RN    Exp. Date: 06/16/2010    Lot #: [id number]  Flu Vaccine Consent Questions    Do you have a history of severe allergic reactions to this vaccine? no    Any prior history of allergic reactions to egg and/or gelatin? no    Do you have a sensitivity to the preservative Thimersol? no    Do you have a past history of Guillan-Barre Syndrome? no    Do you currently have an acute febrile illness? no    Have you ever had a severe reaction to latex? no    Vaccine information given and explained to patient? yes

## 2010-04-16 NOTE — Assessment & Plan Note (Signed)
Summary: foot tingling/gg   Vital Signs:  Patient profile:   40 year old male Height:      72 inches Weight:      218.0 pounds (99.09 kg) Temp:     98.6 degrees F (37.00 degrees C) oral Pulse rate:   95 / minute BP sitting:   141 / 83  (right arm) Cuff size:   regular  Vitals Entered By: Krystal Eaton Duncan Dull) (April 18, 2009 3:35 PM) CC: tingling in left foot started 04/16/09 Is Patient Diabetic? Yes Did you bring your meter with you today? No Pain Assessment Patient in pain? no      Type: heaviness Nutritional Status BMI of 25 - 29 = overweight CBG Result 302  Does patient need assistance? Functional Status Self care Ambulation Normal   Diabetic Foot Exam Foot Inspection Is there a history of a foot ulcer?              No Is there a foot ulcer now?              No Can the patient see the bottom of their feet?          Yes Are the shoes appropriate in style and fit?          Yes Is there swelling or an abnormal foot shape?          No Are the toenails long?                No Are the toenails thick?                No Is there heavy callous build-up?              No  Diabetic Foot Care Education Patient educated on appropriate care of diabetic feet.   High Risk Feet? No   10-g (5.07) Semmes-Weinstein Monofilament Test Performed by: Toney Rakes          Right Foot          Left Foot Site 1         normal         normal Site 4         normal         normal Site 5         normal         normal Site 6         normal         normal  Impression      normal         normal   Primary Care Provider:  [name]  CC:  tingling in left foot started 04/16/09.  History of Present Illness: 40 y/o HIV positive man with DM, HTN and HLD comes to the clinic because he has been feeling pins and needle sensations in his feel since 4-5 days.  Felt in small part of 1 feet. No numbness. No pain. No change in color. No injury. Started gradually. Stable in severity  now. No problems in hands or other feet.   Current Medications (verified): 1)  Lipitor 10 Mg Tabs (Atorvastatin Calcium) .... Take One Tablet At Bedtime 2)  Lisinopril 40 Mg Tabs (Lisinopril) .... Take One Tablet Daily 3)  Glipizide 10 Mg Tabs (Glipizide) .... Take One Tablet Daily 4)  Actos 15 Mg Tabs (Pioglitazone Hcl) .... Take 1 Tablet By Mouth Once A Day 5)  Atripla 600-200-300 Mg Tabs (Efavirenz-Emtricitab-Tenofovir) .Marland KitchenMarland KitchenMarland Kitchen  Take 1 Tablet By Mouth At Bedtime 6)  Atripla 600-200-300 Mg Tabs (Efavirenz-Emtricitab-Tenofovir) .... Take 1 Tablet By Mouth Once A Day 7)  Atripla 600-200-300 Mg Tabs (Efavirenz-Emtricitab-Tenofovir) .... Take 1 Tablet By Mouth Once A Day  Allergies (verified): No Known Drug Allergies  Past History:  Past Medical History: Last updated: 03/22/2008 Diabetes mellitus, type II 2006 Hepatitis B, hx of HIV disease Hyperlipidemia Hypertension Syphillis  Family History: Last updated: 03/22/2008 Family History Diabetes 1st degree relative Family History Kidney disease grandfather with MI  Social History: Last updated: 03/22/2008 Single Never Smoked Alcohol use-no Drug use-no  Risk Factors: Alcohol Use: 0 (01/29/2009) Caffeine Use: 0 (01/29/2009) Exercise: yes (01/29/2009)  Risk Factors: Smoking Status: never (01/29/2009)  Review of Systems  The patient denies anorexia, fever, weight loss, weight gain, vision loss, decreased hearing, hoarseness, chest pain, syncope, dyspnea on exertion, peripheral edema, prolonged cough, headaches, hemoptysis, abdominal pain, melena, hematochezia, severe indigestion/heartburn, hematuria, incontinence, genital sores, muscle weakness, suspicious skin lesions, transient blindness, difficulty walking, depression, unusual weight change, abnormal bleeding, enlarged lymph nodes, angioedema, and testicular masses.    Physical Exam  General:  alert, well-developed, well-nourished, and well-hydrated.   Mouth:  good  dentition and no dental plaque.   Neck:  supple and full ROM.   Lungs:  normal breath sounds, no crackles, and no wheezes.   Heart:  normal rate, regular rhythm, no murmur, and no JVD.   Abdomen:  soft, non-tender, normal bowel sounds, no distention, and no masses.   Msk:  normal ROM, no joint tenderness, no joint swelling, and no joint warmth.   Pulses:  R radial normal, R popliteal normal, R posterior tibial normal, R dorsalis pedis normal, L radial normal, L femoral normal, L popliteal normal, L posterior tibial normal, and L dorsalis pedis normal.   Extremities:  no edema Neurologic:  no numbness or loss of sensatio. motor strength good. non focal neuro exam  Diabetes Management Exam:    Foot Exam (with socks and/or shoes not present):       Sensory-Monofilament:          Left foot: normal          Right foot: normal   Impression & Recommendations:  Problem # 1:  PARESTHESIA (ICD-782.0) his presentation looks like a paresthesia due to uncontrolled diabetes. Normal pulsations and normal motor/sensory exam makes any comlicated diagnosis less likely. Will try neurontin for now.   Problem # 2:  DIABETES MELLITUS, TYPE II (ICD-250.00) Poor control .PAtient complinat with medication. Will need insulin soon it seems. Was not on metformin yet. Will start metformin first. Will also refer to Jamison Neighbor. May need to lantus to control blood sugar better. Opthalmology and DM clinic referral.   His updated medication list for this problem includes:    Lisinopril 40 Mg Tabs (Lisinopril) .Marland Kitchen... Take one tablet daily    Glipizide 10 Mg Tabs (Glipizide) .Marland Kitchen... Take one tablet daily    Actos 15 Mg Tabs (Pioglitazone hcl) .Marland Kitchen... Take 1 tablet by mouth once a day    Metformin Hcl 500 Mg Tabs (Metformin hcl) .Marland Kitchen... Take 1 tablet by mouth once a day for a week. than increase it to  1 tablet by mouth two times a day if you are not having diarrheaor belly pain  Orders: T- Capillary Blood Glucose  (98119) T-Hgb A1C (in-house) (14782NF) T-Comprehensive Metabolic Panel ([id number]) T-Hgb A1C (in-house) (46962XB) T-Urine Microalbumin w/creat. ratio [phone]) Ophthalmology Referral (Ophthalmology) Diabetic Clinic Referral (Diabetic)  Labs Reviewed: Creat:  0.93 (01/29/2009)    Reviewed HgBA1c results: 13.1 (04/18/2009)  10.1 (10/12/2008)  Problem # 3:  HIV INFECTION (ICD-042) CD4 470 in 01/2009. Mx by Dr. Ninetta Lights. No opportunisitc infections. Compliant with atripla.   Problem # 4:  HYPERTENSION (ICD-401.9) A little above the goal. May add HCTZ if persists at this range. No intervention at this time.  His updated medication list for this problem includes:    Lisinopril 40 Mg Tabs (Lisinopril) .Marland Kitchen... Take one tablet daily  BP today: 141/83 Prior BP: 138/90 (01/29/2009)  Labs Reviewed: K+: 4.1 (01/29/2009) Creat: : 0.93 (01/29/2009)   Chol: 129 (02/09/2008)   HDL: 54 (02/09/2008)   LDL: 60 (02/09/2008)   TG: 74 (02/09/2008)  Problem # 5:  HYPERLIPIDEMIA (ICD-272.4) Patient is not fasting. Not checking lipids today. Will check LFT.   His updated medication list for this problem includes:    Lipitor 10 Mg Tabs (Atorvastatin calcium) .Marland Kitchen... Take one tablet at bedtime  Labs Reviewed: SGOT: 144 (01/29/2009)   SGPT: 177 (01/29/2009)   HDL:54 (02/09/2008), 37 (04/01/2007)  LDL:60 (02/09/2008), 47 (04/01/2007)  Chol:129 (02/09/2008), 162 (04/01/2007)  Trig:74 (02/09/2008), 390 (04/01/2007)  Complete Medication List: 1)  Lipitor 10 Mg Tabs (Atorvastatin calcium) .... Take one tablet at bedtime 2)  Lisinopril 40 Mg Tabs (Lisinopril) .... Take one tablet daily 3)  Glipizide 10 Mg Tabs (Glipizide) .... Take one tablet daily 4)  Actos 15 Mg Tabs (Pioglitazone hcl) .... Take 1 tablet by mouth once a day 5)  Atripla 600-200-300 Mg Tabs (Efavirenz-emtricitab-tenofovir) .... Take 1 tablet by mouth at bedtime 6)  Atripla 600-200-300 Mg Tabs (Efavirenz-emtricitab-tenofovir) .... Take  1 tablet by mouth once a day 7)  Atripla 600-200-300 Mg Tabs (Efavirenz-emtricitab-tenofovir) .... Take 1 tablet by mouth once a day 8)  Neurontin 100 Mg Caps (Gabapentin) .... Take 1 tablet by mouth three times a day 9)  Metformin Hcl 500 Mg Tabs (Metformin hcl) .... Take 1 tablet by mouth once a day for a week. than increase it to  1 tablet by mouth two times a day if you are not having diarrheaor belly pain  Patient Instructions: 1)  Please schedule a follow-up appointment in 3 months. Also shcedule an appointment with Jamison Neighbor. Come fasting at next appointment.  Prescriptions: METFORMIN HCL 500 MG TABS (METFORMIN HCL) Take 1 tablet by mouth once a day for a week. Than increase it to  1 tablet by mouth two times a day if you are not having diarrheaor belly pain  #60 x 3   Entered and Authorized by:   Bethel Born MD   Signed by:   Bethel Born MD on 04/18/2009   Method used:   Print then Give to Patient   RxID:   [id number] NEURONTIN 100 MG CAPS (GABAPENTIN) Take 1 tablet by mouth three times a day  #90 x 3   Entered and Authorized by:   Bethel Born MD   Signed by:   Bethel Born MD on 04/18/2009   Method used:   Print then Give to Patient   RxID:   [id number]   Prevention & Chronic Care Immunizations   Influenza vaccine: Historical  (01/12/2009)   Influenza vaccine due: 11/15/2009    Tetanus booster: Not documented   Td booster deferral: Deferred  (10/12/2008)   Tetanus booster due: 10/01/2017    Pneumococcal vaccine: Pneumovax  (01/29/2009)   Pneumococcal vaccine due: 01/29/2014  Other Screening   Smoking status: never  (01/29/2009)  Diabetes Mellitus   HgbA1C: 13.1  (  04/18/2009)    Eye exam: Not documented   Diabetic eye exam action/deferral: Ophthalmology referral  (04/18/2009)    Foot exam: yes  (04/18/2009)   Foot exam action/deferral: Do today   High risk foot: No  (04/18/2009)   Foot care education: Done  (04/18/2009)   Foot exam due:  09/12/2009    Urine microalbumin/creatinine ratio: Not documented   Urine microalbumin action/deferral: Ordered    Diabetes flowsheet reviewed?: Yes   Progress toward A1C goal: Deteriorated  Lipids   Total Cholesterol: 129  (02/09/2008)   Lipid panel action/deferral: Deferred   LDL: 60  (02/09/2008)   LDL Direct: Not documented   HDL: 54  (02/09/2008)   Triglycerides: 74  (02/09/2008)    SGOT (AST): 144  (01/29/2009)   SGPT (ALT): 177  (01/29/2009) CMP ordered    Alkaline phosphatase: 98  (01/29/2009)   Total bilirubin: 0.4  (01/29/2009)    Lipid flowsheet reviewed?: Yes   Progress toward LDL goal: At goal  Hypertension   Last Blood Pressure: 141 / 83  (04/18/2009)   Serum creatinine: 0.93  (01/29/2009)   Serum potassium 4.1  (01/29/2009) CMP ordered     Hypertension flowsheet reviewed?: Yes   Progress toward BP goal: Deteriorated  Self-Management Support :   Personal Goals (by the next clinic visit) :     Personal A1C goal: 7  (10/12/2008)     Personal blood pressure goal: 130/80  (10/12/2008)     Personal LDL goal: 100  (04/18/2009)    Patient will work on the following items until the next clinic visit to reach self-care goals:     Medications and monitoring: take my medicines every day, check my blood sugar  (04/18/2009)     Eating: drink diet soda or water instead of juice or soda, eat more vegetables, eat foods that are low in salt, eat baked foods instead of fried foods  (04/18/2009)     Activity: take a 30 minute walk every day  (04/18/2009)     Other: bring meter to visits  (04/18/2009)    Diabetes self-management support: Written self-care plan, Referred for medical nutrition therapy  (04/18/2009)   Diabetes care plan printed   Last diabetes self-management training by diabetes educator: 11/03/2008   Referred for diabetes self-mgmt training.    Hypertension self-management support: Written self-care plan, Referred for medical nutrition therapy   (04/18/2009)   Hypertension self-care plan printed.    Lipid self-management support: Written self-care plan, Referred for medical nutrition therapy  (04/18/2009)   Lipid self-care plan printed.   Nursing Instructions: Refer for screening diabetic eye exam (see order) Diabetic foot exam today Refer for medical nutrition therapy (see order)   Process Orders Check Orders Results:     [facility]: ABN not required for this insurance Tests Sent for requisitioning (April 18, 2009 9:43 PM):     04/18/2009: [facility] -- T-Comprehensive Metabolic Panel [[id number]] (signed)     04/18/2009: [facility] -- T-Urine Microalbumin w/creat. 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

## 2010-04-16 NOTE — Letter (Signed)
Summary: [facility]  [facility]   Imported By: Margie Billet 06/28/2009 14:22:04  _____________________________________________________________________  External Attachment:    Type:   Image     Comment:   External Document

## 2010-04-16 NOTE — Miscellaneous (Signed)
Summary: Lab Orders   Clinical Lists Changes  Orders: Added new Test order of T-Hgb A1C (in-house) [phone]) - Signed Added new Test order of T-CBC w/Diff ([id number]) - Signed Added new Test order of T-CD4SP [facility]) (CD4SP) - Signed Added new Test order of T-Comprehensive Metabolic Panel [phone]) - Signed Added new Test order of T-HIV Viral Load [phone]) - Signed     Process Orders Check Orders Results:     [facility]: ABN not required for this insurance Order queued for requisitioning for [facility]: December 28, 2009 10:03 AM  Tests Sent for requisitioning (December 28, 2009 10:03 AM):     12/31/2009: [facility] -- T-CBC w/Diff [[id number]] (signed)     12/31/2009: [facility] -- T-Comprehensive Metabolic Panel [[id number]] (signed)     12/31/2009: [facility] -- T-HIV Viral Load [phone] (signed)

## 2010-04-16 NOTE — Assessment & Plan Note (Signed)
Summary: 5 MONTH F/U VS   Primary Provider:  [name]  CC:  5 month follow up.  History of Present Illness: 40 y/o HIV+  M  with DM, HTN and HLD. CD4 470 and VL <48 (Nov 2010), AST 137 and ALT 152 (Feb 2011). on atripla. no probs with atripla. no missed or skipped.   Has not been following FSGs. Ran out of strips and has not been able to to get refilled. Neuropathy has improved. has run out of metformin.   Preventive Screening-Counseling & Management  Alcohol-Tobacco     Alcohol drinks/day: 0     Smoking Status: never  Caffeine-Diet-Exercise     Caffeine use/day: sodas     Does Patient Exercise: yes     Type of exercise: push ups and sit ups     Exercise (avg: min/session): 30-60     Times/week: <3  Safety-Violence-Falls     Seat Belt Use: yes   Updated Prior Medication List: LIPITOR 10 MG TABS (ATORVASTATIN CALCIUM) take one tablet at bedtime LISINOPRIL 40 MG TABS (LISINOPRIL) take one tablet daily GLIPIZIDE 10 MG TABS (GLIPIZIDE) take one tablet daily ACTOS 15 MG TABS (PIOGLITAZONE HCL) Take 1 tablet by mouth once a day ATRIPLA 600-200-300 MG TABS (EFAVIRENZ-EMTRICITAB-TENOFOVIR) Take 1 tablet by mouth once a day NEURONTIN 100 MG CAPS (GABAPENTIN) Take 1 tablet by mouth three times a day METFORMIN HCL 500 MG TABS (METFORMIN HCL) Take 1 tablet by mouth once a day for a week. Than increase it to  1 tablet by mouth two times a day if you are not having diarrheaor belly pain  Current Allergies (reviewed today): No known allergies  Allergies (verified): No Known Drug Allergies   Review of Systems       5# wt loss. c/o polyuria. has been seen by Ophtho and given "good" exam. offered condoms and he states "check me for everything sexual"  Vital Signs:  Patient profile:   40 year old male Height:      72 inches (182.88 cm) Weight:      213.8 pounds (97.18 kg) BMI:     29.10 Temp:     98.2 degrees F (36.78 degrees C) oral Pulse rate:   85 / minute BP sitting:    122 / 88  (left arm)  Vitals Entered By: Baxter Hire) (July 04, 2009 10:20 AM) CC: 5 month follow up Pain Assessment Patient in pain? no      Nutritional Status BMI of 25 - 29 = overweight Nutritional Status Detail appetite is pretty good per patient  Have you ever been in a relationship where you felt threatened, hurt or afraid?No   Does patient need assistance? Functional Status Self care Ambulation Normal        Medication Adherence: 07/04/2009   Adherence to medications reviewed with patient. Counseling to provide adequate adherence provided   Prevention For Positives: 07/04/2009   Safe sex practices discussed with patient. Condoms offered.                             Physical Exam  General:  well-developed, well-nourished, and well-hydrated.   Eyes:  pupils equal, pupils round, and pupils reactive to light.   Mouth:  pharynx pink and moist and no exudates.   Neck:  no masses.   Lungs:  normal respiratory effort and normal breath sounds.   Heart:  normal rate, regular rhythm, and no murmur.  Abdomen:  soft, non-tender, and normal bowel sounds.     Impression & Recommendations:  Problem # 1:  HIV INFECTION (ICD-042)  will check labs today, give condoms. doing well from HIV perspective, his DM seems to be his biggest problem. return to clinic 4-5 months.   Orders: T-CD4SP [facility]) (CD4SP) T-HIV Viral Load [phone]) T-Comprehensive Metabolic Panel [id number]) T-Lipid Profile [phone]) T-CBC w/Diff [id number]) T-RPR (Syphilis) [phone]) Est. Patient Level IV ([address]323) T-GC Probe, urine [phone]) T-Chlamydia  Probe, urine [phone])  Problem # 2:  DIABETES MELLITUS, TYPE II (ICD-250.00)  will refill his metformin, have him f/u with IM. needs testing material, would like an AIC today His updated medication list for this problem includes:    Lisinopril 40 Mg Tabs (Lisinopril) .Marland Kitchen... Take one tablet daily    Glipizide 10  Mg Tabs (Glipizide) .Marland Kitchen... Take one tablet daily    Actos 15 Mg Tabs (Pioglitazone hcl) .Marland Kitchen... Take 1 tablet by mouth once a day    Metformin Hcl 500 Mg Tabs (Metformin hcl) .Marland Kitchen... Take 1 tablet by mouth once a day for a week. than increase it to  1 tablet by mouth two times a day if you are not having diarrheaor belly pain  Orders: T- Hemoglobin A1C ([id number])  Prescriptions: METFORMIN HCL 500 MG TABS (METFORMIN HCL) Take 1 tablet by mouth once a day for a week. Than increase it to  1 tablet by mouth two times a day if you are not having diarrheaor belly pain  #60 x 3   Entered and Authorized by:   Johny Sax MD   Signed by:   Johny Sax MD on 07/04/2009   Method used:   Print then Give to Patient   RxID:   [id number]  Process Orders Check Orders Results:     [facility]: ABN not required for this insurance Tests Sent for requisitioning (July 04, 2009 10:50 AM):     07/04/2009: [facility] -- T-HIV Viral Load [phone] (signed)     07/04/2009: [facility] -- T-Comprehensive Metabolic Panel [[id number]] (signed)     07/04/2009: [facility] -- T-Lipid Profile [phone] (signed)     07/04/2009: [facility] -- T-CBC w/Diff [[id number]] (signed)     07/04/2009: [facility] -- T-RPR (Syphilis) [phone] (signed)     07/04/2009: [facility] -- T-GC Probe, urine [phone] (signed)     07/04/2009: [facility] -- T-Chlamydia  Probe, urine [phone] (signed)     07/04/2009: [facility] -- T- Hemoglobin A1C [[id number]] (signed)

## 2010-04-16 NOTE — Progress Notes (Signed)
----   Converted from flag ---- ---- 01/08/2010 11:10 AM, Chinita Pester RN wrote: Thanks  ---- 01/08/2010 9:04 AM, Shon Hough wrote: Patient has been given an appointment with Dr. Threasa Beards for 01/30/2010 at 10:00am.  ---- 01/02/2010 5:30 PM, Chinita Pester RN wrote: [name] [name], [name] needs a Nov appt. w/Dr. Threasa Beards per his request. Thanks ------------------------------

## 2010-04-16 NOTE — Miscellaneous (Signed)
Summary: Appointment No Show  Appointment status changed to no show by [employer] on 06/19/2009 4:53 PM.  No Show Comments ---------------- lab [mkj]  Appointment Information ----------------------- Appt Type:  LAB NO DOCUMENT      Date:  Tuesday, June 19, 2009      Time:  10:00 AM for 30 min   Urgency:  Routine   Made By:  Pearson Grippe  To Visit:  [id number]    Reason:  lab [mkj]  Appt Comments ------------- -- 06/19/09 16:53: ([facility]) NO SHOW -- lab [mkj] -- 06/18/09 14:40: ([facility]) BOOKED -- Routine LAB NO DOCUMENT at 06/19/2009 10:00 AM for 30 min lab [mkj] -- 06/18/09 14:39: ([facility]) BOOKED -- Routine LAB NO DOCUMENT at 06/19/2009 10:00 AM for 30 min lab [mkj]

## 2010-04-16 NOTE — Progress Notes (Signed)
Summary: missed appointment for diabetes self management training/dmr  Phone Note Outgoing Call   Call placed by: Jamison Neighbor RD,CDE,  May 21, 2009 8:51 AM Summary of Call: called patient due to uncontrolled diabetes and missed appointment. left message for return call if rescheduling desired.

## 2010-04-16 NOTE — Progress Notes (Signed)
Summary: phone/gg  Phone Note Call from Patient   Caller: Patient Summary of Call: Pt called with c/o left foot has tingling feeling for few days.  Hx DM   Wants to be seen. appointment given for today. Initial call taken by: Merrie Roof RN,  April 18, 2009 11:18 AM

## 2010-04-16 NOTE — Progress Notes (Signed)
----   Converted from flag ---- ---- 01/08/2010 11:10 AM, Chinita Pester RN wrote: Thanks  ---- 01/08/2010 9:04 AM, Shon Hough wrote: Patient has been given an appointment with Dr. Threasa Beards for 01/30/2010 at 10:00am  ---- 01/04/2010 10:09 AM, Chinita Pester RN wrote: [name] [name], Mr. Lieurance needs an appt. w/Dr. Threasa Beards per MD request.  Thanks ------------------------------

## 2010-04-18 NOTE — Miscellaneous (Signed)
  Clinical Lists Changes  Observations: Added new observation of INCOMESOURCE: UNKNOWN (04/11/2010 12:05) Added new observation of HOUSEINCOME: 0  (04/11/2010 12:05)

## 2010-04-18 NOTE — Progress Notes (Signed)
Summary: Medication refill request Kaletra   Phone Note Call from Patient   Summary of Call: Pt states he has been 2 weeks w/o his Kaletra. Please send to pharmacy [facility] RN  March 05, 2010 10:25 AM   Follow-up for Phone Call        Medication was not listed on med list.  He is on Atripla and the list does not show Kaletra / Tomasita Morrow RN  March 05, 2010 10:27 AM   I will send medication to pharmacy based on  previous list of refills.  Tomasita Morrow RN  March 05, 2010 10:29 AM  Follow-up by: Tomasita Morrow RN,  March 05, 2010 10:29 AM    New/Updated Medications: KALETRA 200-50 MG TABS (LOPINAVIR-RITONAVIR) take two tablets twice daily Prescriptions: KALETRA 200-50 MG TABS (LOPINAVIR-RITONAVIR) take two tablets twice daily  #120 x 3   Entered and Authorized by:   Johny Sax MD   Signed by:   Johny Sax MD on 03/06/2010   Method used:   Electronically to        [facility]  [address] [phone]* (retail)       [address]       [place], [place]  [address]       Ph: [phone] or [phone]       Fax: [phone]   RxID:   [id number] KALETRA 200-50 MG TABS (LOPINAVIR-RITONAVIR) take two tablets twice daily  #120 x 3   Entered by:   Tomasita Morrow RN   Authorized by:   Johny Sax MD   Signed by:   Tomasita Morrow RN on 03/05/2010   Method used:   [facility]        [facility]  [address] [phone]* (retail)       [address]       [place], [place]  [address]       Ph: [phone] or [phone]       Fax: [phone]   RxID:   [id number]   Appended Document: Orders Update Per Dr Ninetta Lights, pt should not be taking the Kaletra.  His regimen is Atripla.   Pt has called several times to request Kaletra.  Multiple messages left for him to call our office. I will send refills for Atripla   Clinical Lists Changes

## 2010-05-29 LAB — T-HELPER CELL (CD4) - (RCID CLINIC ONLY): CD4 T Cell Abs: 820 uL (ref 400–2700)

## 2010-06-23 LAB — GLUCOSE, CAPILLARY: Glucose-Capillary: 306 mg/dL — ABNORMAL HIGH (ref 70–99)

## 2010-07-01 LAB — GLUCOSE, CAPILLARY: Glucose-Capillary: 411 mg/dL — ABNORMAL HIGH (ref 70–99)

## 2010-07-24 ENCOUNTER — Other Ambulatory Visit: Payer: Self-pay | Admitting: *Deleted

## 2010-07-24 DIAGNOSIS — E785 Hyperlipidemia, unspecified: Secondary | ICD-10-CM

## 2010-07-24 MED ORDER — ATORVASTATIN CALCIUM 10 MG PO TABS: 10.0000 mg | ORAL_TABLET | Freq: Every day | ORAL | Status: DC

## 2010-08-01 ENCOUNTER — Encounter: Payer: Self-pay | Admitting: Internal Medicine

## 2010-08-20 ENCOUNTER — Other Ambulatory Visit: Payer: Self-pay | Admitting: Infectious Diseases

## 2010-08-20 ENCOUNTER — Other Ambulatory Visit: Payer: Self-pay

## 2010-08-20 DIAGNOSIS — B20 Human immunodeficiency virus [HIV] disease: Secondary | ICD-10-CM

## 2010-08-23 ENCOUNTER — Other Ambulatory Visit: Payer: Self-pay | Admitting: Infectious Diseases

## 2010-08-23 ENCOUNTER — Other Ambulatory Visit (INDEPENDENT_AMBULATORY_CARE_PROVIDER_SITE_OTHER): Payer: BC Managed Care – PPO

## 2010-08-23 DIAGNOSIS — B20 Human immunodeficiency virus [HIV] disease: Secondary | ICD-10-CM

## 2010-08-23 LAB — LIPID PANEL
HDL: 52 mg/dL (ref >39)
Triglycerides: 75 mg/dL (ref <150)

## 2010-08-24 LAB — CBC WITH DIFFERENTIAL/PLATELET
Basophils Absolute: 0 10*3/uL (ref 0.0–0.1)
Basophils Relative: 0 % (ref 0–1)
Lymphocytes Relative: 20 % (ref 12–46)
MCHC: 31.5 g/dL (ref 30.0–36.0)
Neutro Abs: 4.9 10*3/uL (ref 1.7–7.7)
Platelets: 156 10*3/uL (ref 150–400)
RDW: 13.4 % (ref 11.5–15.5)
WBC: 6.8 10*3/uL (ref 4.0–10.5)

## 2010-08-24 LAB — URINALYSIS, ROUTINE W REFLEX MICROSCOPIC
Hgb urine dipstick: NEGATIVE
Leukocytes, UA: NEGATIVE
Nitrite: NEGATIVE
Protein, ur: NEGATIVE mg/dL
Urobilinogen, UA: 1 mg/dL (ref 0.0–1.0)
pH: 5.5 (ref 5.0–8.0)

## 2010-08-24 LAB — URINALYSIS, MICROSCOPIC ONLY
Casts: NONE SEEN
Crystals: NONE SEEN
RBC / HPF: NONE SEEN RBC/hpf (ref <3)
WBC, UA: NONE SEEN WBC/hpf (ref <3)

## 2010-08-24 LAB — COMPLETE METABOLIC PANEL WITH GFR
Albumin: 4 g/dL (ref 3.5–5.2)
BUN: 9 mg/dL (ref 6–23)
CO2: 24 mEq/L (ref 19–32)
Calcium: 8.9 mg/dL (ref 8.4–10.5)
Chloride: 101 mEq/L (ref 96–112)
GFR, Est African American: >60 mL/min (ref >60)
GFR, Est Non African American: >60 mL/min (ref >60)
Glucose, Bld: 402 mg/dL — ABNORMAL HIGH (ref 70–99)
Potassium: 4.1 mEq/L (ref 3.5–5.3)

## 2010-08-24 LAB — RPR

## 2010-08-26 ENCOUNTER — Other Ambulatory Visit: Payer: Self-pay | Admitting: *Deleted

## 2010-08-26 DIAGNOSIS — I10 Essential (primary) hypertension: Secondary | ICD-10-CM

## 2010-08-26 MED ORDER — LISINOPRIL 40 MG PO TABS: 40.0000 mg | ORAL_TABLET | Freq: Every day | ORAL | Status: DC

## 2010-09-04 ENCOUNTER — Ambulatory Visit (INDEPENDENT_AMBULATORY_CARE_PROVIDER_SITE_OTHER): Payer: BC Managed Care – PPO | Admitting: Infectious Diseases

## 2010-09-04 ENCOUNTER — Encounter: Payer: Self-pay | Admitting: Infectious Diseases

## 2010-09-04 ENCOUNTER — Telehealth: Payer: Self-pay | Admitting: *Deleted

## 2010-09-04 DIAGNOSIS — E119 Type 2 diabetes mellitus without complications: Secondary | ICD-10-CM

## 2010-09-04 DIAGNOSIS — Z23 Encounter for immunization: Secondary | ICD-10-CM

## 2010-09-04 DIAGNOSIS — B20 Human immunodeficiency virus [HIV] disease: Secondary | ICD-10-CM

## 2010-09-04 NOTE — Assessment & Plan Note (Signed)
Will have him seen in IM clinic for f/u. He is aware that he is currently not at goal. He has seen ophtho in last year.

## 2010-09-04 NOTE — Assessment & Plan Note (Addendum)
He is offered condoms (refused). He is given samples of ART to use to bridge him for periods when he will have a gap. He is otherwise doing well. Adherence reinforced with him. Will start Hep A vax. See him back in 4-5 months.

## 2010-09-04 NOTE — Telephone Encounter (Signed)
Patient called and notified he has an appointment [facility] for Friday 09/06/10 at 10:00 AM and will see Jamison Neighbor at 11:15 AM. Wendall Mola CMA

## 2010-09-04 NOTE — Progress Notes (Signed)
  Subjective:    Patient ID: [name] [name], male    DOB: [birth year], 40 y.o.   MRN: [id number]  HPI 40 y/o HIV+ M with DM, HTN and HLD. CD4 820 -> 370 and VL und (08-23-10). on atripla. No longer taking neurontin, does not have tingling in his feet. No problems taking art. May 1-2 doses/month due to co-pay issues. Does not check FSG at home. Can't afford lancets.    Review of Systems  Constitutional: Positive for unexpected weight change.  Eyes: Positive for visual disturbance.  Gastrointestinal: Negative for diarrhea and constipation.  Genitourinary: Positive for frequency.       Objective:   Physical Exam  Constitutional: He appears well-developed and well-nourished.  Eyes: EOM are normal. Pupils are equal, round, and reactive to light.  Neck: Neck supple.  Cardiovascular: Normal rate, regular rhythm and normal heart sounds.   Pulmonary/Chest: Effort normal and breath sounds normal.  Abdominal: Soft. Bowel sounds are normal. He exhibits no distension.  Neurological: No sensory deficit.  Skin:             Assessment & Plan:

## 2010-09-04 NOTE — Telephone Encounter (Signed)
Jacque at [facility] calls to say dr hatcher would like pt seen asap, diab out of control, states he does not check cbg due to him not being able to afford supplies even though he is insured, will be seen in clinic 6/22 at 1015 then by donnar. At 1115

## 2010-09-06 ENCOUNTER — Encounter: Payer: Self-pay | Admitting: Internal Medicine

## 2010-09-06 ENCOUNTER — Ambulatory Visit (INDEPENDENT_AMBULATORY_CARE_PROVIDER_SITE_OTHER): Payer: BC Managed Care – PPO | Admitting: Internal Medicine

## 2010-09-06 ENCOUNTER — Other Ambulatory Visit: Payer: Self-pay | Admitting: Dietician

## 2010-09-06 ENCOUNTER — Ambulatory Visit (INDEPENDENT_AMBULATORY_CARE_PROVIDER_SITE_OTHER): Payer: BC Managed Care – PPO | Admitting: Dietician

## 2010-09-06 ENCOUNTER — Encounter: Payer: Self-pay | Admitting: Dietician

## 2010-09-06 VITALS — BP 127/89 | HR 76 | Temp 97.0°F | Ht 72.0 in | Wt 203.4 lb

## 2010-09-06 DIAGNOSIS — E119 Type 2 diabetes mellitus without complications: Secondary | ICD-10-CM

## 2010-09-06 DIAGNOSIS — I10 Essential (primary) hypertension: Secondary | ICD-10-CM

## 2010-09-06 DIAGNOSIS — R209 Unspecified disturbances of skin sensation: Secondary | ICD-10-CM

## 2010-09-06 DIAGNOSIS — E785 Hyperlipidemia, unspecified: Secondary | ICD-10-CM

## 2010-09-06 LAB — GLUCOSE, CAPILLARY: Glucose-Capillary: 304 mg/dL — ABNORMAL HIGH (ref 70–99)

## 2010-09-06 LAB — POCT GLYCOSYLATED HEMOGLOBIN (HGB A1C): Hemoglobin A1C: 13.7

## 2010-09-06 MED ORDER — ONETOUCH DELICA LANCETS MISC: Status: DC

## 2010-09-06 MED ORDER — INSULIN PEN NEEDLE 31G X 5 MM MISC: Status: DC

## 2010-09-06 MED ORDER — INSULIN GLARGINE 100 UNIT/ML ~~LOC~~ SOLN: 10.0000 [IU] | Freq: Every day | SUBCUTANEOUS | Status: DC

## 2010-09-06 MED ORDER — GLUCOSE BLOOD VI STRP: ORAL_STRIP | Status: DC

## 2010-09-06 MED ORDER — METFORMIN HCL 1000 MG PO TABS: 1000.0000 mg | ORAL_TABLET | Freq: Two times a day (BID) | ORAL | Status: DC

## 2010-09-06 NOTE — Patient Instructions (Addendum)
1. Only take the medication listed. 2. Start Lantus 10 units daily at bedtime. Check your blood glucose level every morning before breakfast. If the blood glucose level is above 150 for 2 days increase the Lantus to 1 unit.  3. If you have any questions please feel free to call the clinic. [phone]

## 2010-09-06 NOTE — Assessment & Plan Note (Deleted)
Dx date 08/16/2003

## 2010-09-06 NOTE — Progress Notes (Signed)
Diabetes Self-Management Training (DSMT)  Follow-Up 1 Visit- seen about 1 year ago  09/06/2010 [name], identified by name and date of birth, is a 40 y.o. male with noted patient with Type 2 diabetes, but question Latent autoimmune . Year of diabetes diagnosis: 2005 Other persons present: no  ASSESSMENT Patient concerns are Glycemic control.  There were no vitals taken for this visit. There is no height or weight on file to calculate BMI. Lab Results  Component Value Date   LDLCALC 95 08/23/2010   Lab Results  Component Value Date   HGBA1C 13.4* 12/31/2009   Medication Nutrition Monitor: One touch  Labs reviewed.  DIABETES BUNDLE: A1C in past 6 months? Yes.  Less than 7%? No LDL in past year? Yes.  Less than 100 mg/dL? Yes Microalbumin ratio in past year? No. Patient taking ACE or ARB? Yes. Blood pressure less than 130/80? No.  Sent note to MD. Foot exam in last year? Yes. Eye exam in past year? Yes. Tobacco use? No. Pneumovax? Yes Flu vaccine? Yes Asprin? No  Family history of diabetes: Yes  Support systems: need to assess at next visit  Special needs: None, possibly feeling associated with diabetes- grandmother with amputations and mother on dialysis  Prior DM Education: Yes   Medications See Medications list.  Is interested in learning more and Needs skills/knowledge review  Patients belief/attitude about diabetes: Diabetes can be controlled.  Self foot exams daily: Yes  Diabetes Complications: None   Exercise Plan Doing will assess at next visit for .   Self-Monitoring Frequency of testing: stopped testing Breakfast: 2456 today on his new meter- he reports that he is fasting  Hyperglycemia: Yes Daily Hypoglycemia: No   Meal Planning Some knowledge and reassess at next visit   Assessment comments: patient taking 3 oral meds for his diabetes and has lost 47# unintentionally in the past three months. Verbalizes the desire to live a  quality life as motivating him to take care of his diabetes.      INDIVIDUAL DIABETES EDUCATION PLAN:  Diabetes disease state Medication Monitoring Acute complication: _______________________________________________________________________  Intervention TOPICS COVERED TODAY:  Diabetes disease state Definition of diabetes, type 1 and 2, and the diagnosis of diabetes. Factors that contribute to the development of diabetes. Explored patient's options for treatment of their diabetes. Medication  Taught/evaluated insulin injection, site rotation, insulin storage and needle disposal. Reviewed patients medication for diabetes, action, purpose, timing of dose and side effects. Monitoring  Taught/evaluated SMBG with onetouch ultramini meter. Purpose and frequency of SMBG. Acute complication  Discussed and identified patients' treatment of hyperglycemia. Goal setting  Helped patient develop diabetes management plan for lowering his a1c and risk to compications  PATIENTS GOALS/PLAN (copy and paste in patient instructions so patient receives a copy): 1.  Learning Objective:       Use of insulin pen and knowledge of what numbers are desired, how to self titrate insulin 2.  Behavioral Objective:         Medications: To improve blood glucose levels, I will take my medication as prescribed Most of the time 75% Monitoring: To identify blood glucose trends, I will test my blood glucose once daily day Never 0%  Personalized Follow-Up Plan for Ongoing Self Management Support:  Doctor's Office, family and CDE visits ______________________________________________________________________   Outcomes Expected outcomes:Demonstrated interest in learning.Expect positive changes in lifestyle.  Self-care Barriers: Coping skills  Education material provided: New one touch ultramini meter, 9 strips, Delica lancets, insulin pen instructions  Patient to contact team via Phone if problems or questions.  Time  in: 0930     Time out: 1030  Future DSMT - 2 wks   RILEY,DONNA

## 2010-09-08 NOTE — Assessment & Plan Note (Signed)
Cholesterol well controlled. Continue current regimen. Lab Results  Component Value Date   CHOL 162 08/23/2010   CHOL 128 07/04/2009   CHOL 129 02/09/2008   Lab Results  Component Value Date   HDL 52 08/23/2010   HDL 59 07/04/2009   HDL 54 81/19/1478   Lab Results  Component Value Date   LDLCALC 95 08/23/2010   LDLCALC 50 07/04/2009   LDLCALC 60 02/09/2008   Lab Results  Component Value Date   TRIG 75 08/23/2010   TRIG 97 07/04/2009   TRIG 74 02/09/2008   Lab Results  Component Value Date   CHOLHDL 3.1 08/23/2010   CHOLHDL 2.2 Ratio 07/04/2009   CHOLHDL 2.4 Ratio 02/09/2008   No results found for this basename: LDLDIRECT

## 2010-09-08 NOTE — Assessment & Plan Note (Signed)
Patient noted that his paresthesia has resolved. He had been taking gabapentin in the past but stopped since he was not experiencing any pain.

## 2010-09-08 NOTE — Progress Notes (Signed)
  Subjective:    Patient ID: [name] [name], male    DOB: 1970-03-25, 40 y.o.   MRN: [id number]  HPI This is with a possibly history is significant for HIV, diabetes mellitus, hypertension who presented to the clinic for diabetes control. Patient was referred to the clinic since blood sugars were uncontrolled and hemoglobin A1c was 13.6. Patient was currently on 3 oral medication but not on maximum dose. Patient noted that he was experiencing significant increase of thirst and urination. Further mentioned significant weight loss in the past couple of months unintentionally. He is very anxious since his mother has diabetes and is currently on hemodialysis and his grandmother had diabetes who had amputation due to complication. He noted he does not want to end up like this and would like to control his blood sugars.   Review of Systems  Constitutional: Positive for fatigue and unexpected weight change. Negative for fever and chills.  Eyes: Positive for visual disturbance.  Respiratory: Negative for cough and chest tightness.   Cardiovascular: Negative for chest pain, palpitations and leg swelling.  Gastrointestinal: Negative for nausea, abdominal pain, diarrhea, constipation, abdominal distention and rectal pain.  Genitourinary: Negative for difficulty urinating.  Musculoskeletal: Negative for arthralgias.  Neurological: Negative for dizziness, weakness and headaches.  Hematological: Negative for adenopathy.       Objective:   Physical Exam  Constitutional: He is oriented to person, place, and time. He appears well-developed.  HENT:  Head: Normocephalic.  Neck: Neck supple.  Cardiovascular: Normal rate, regular rhythm and normal heart sounds.   Pulmonary/Chest: Effort normal and breath sounds normal. No respiratory distress.  Abdominal: Soft. Bowel sounds are normal. He exhibits no distension. There is no tenderness.  Musculoskeletal: Normal range of motion.  Neurological: He is alert  and oriented to person, place, and time.  Skin: Skin is warm. No rash noted.          Assessment & Plan:

## 2010-09-08 NOTE — Assessment & Plan Note (Signed)
Patient met with [name] [name] discussed extensively about therapy management. I'll discontinue it at this point glipizide and Actos. I will increase metformin 1000 mg BID and start the patient on Lantus 10 units daily. I advised the patient to check his blood sugars daily before breakfast. If his blood sugars are about 150 for 2 days he needs to increase his Lantus dose to one unit every time his blood sugars is about 150. He was informed that he needs to bring in her meter at the next office visit which would be in 2 weeks. At that time reassess patient's Lantus dose and possible increase if needed. Patient had evaluation for diabetic retinopathy 2 months ago which did not show any acute process. He needs reevaluation in one year.

## 2010-09-08 NOTE — Assessment & Plan Note (Signed)
Blood pressure well controlled. Continue current regimen.  BP Readings from Last 3 Encounters:  09/06/10 127/89  09/04/10 148/92  01/16/10 149/102

## 2010-09-09 ENCOUNTER — Telehealth: Payer: Self-pay | Admitting: Dietician

## 2010-09-09 NOTE — Telephone Encounter (Signed)
Left message for patient to return call as needed.

## 2010-09-20 ENCOUNTER — Encounter: Payer: BC Managed Care – PPO | Admitting: Internal Medicine

## 2010-09-20 ENCOUNTER — Ambulatory Visit: Payer: BC Managed Care – PPO | Admitting: Internal Medicine

## 2010-09-30 ENCOUNTER — Other Ambulatory Visit: Payer: Self-pay | Admitting: *Deleted

## 2010-09-30 DIAGNOSIS — E119 Type 2 diabetes mellitus without complications: Secondary | ICD-10-CM

## 2010-09-30 DIAGNOSIS — B20 Human immunodeficiency virus [HIV] disease: Secondary | ICD-10-CM

## 2010-09-30 MED ORDER — METFORMIN HCL 1000 MG PO TABS: 1000.0000 mg | ORAL_TABLET | Freq: Two times a day (BID) | ORAL | Status: DC

## 2010-09-30 MED ORDER — LOPINAVIR-RITONAVIR 200-50 MG PO TABS: 2.0000 | ORAL_TABLET | Freq: Two times a day (BID) | ORAL | Status: DC

## 2010-10-10 ENCOUNTER — Other Ambulatory Visit: Payer: Self-pay | Admitting: *Deleted

## 2010-10-10 DIAGNOSIS — E119 Type 2 diabetes mellitus without complications: Secondary | ICD-10-CM

## 2010-10-10 MED ORDER — METFORMIN HCL 1000 MG PO TABS: 500.0000 mg | ORAL_TABLET | Freq: Two times a day (BID) | ORAL | Status: DC

## 2010-12-05 LAB — T-HELPER CELL (CD4) - (RCID CLINIC ONLY)
CD4 % Helper T Cell: 30 — ABNORMAL LOW
CD4 T Cell Abs: 470

## 2010-12-12 LAB — T-HELPER CELL (CD4) - (RCID CLINIC ONLY)
CD4 % Helper T Cell: 30 — ABNORMAL LOW
CD4 T Cell Abs: 600

## 2010-12-17 LAB — T-HELPER CELL (CD4) - (RCID CLINIC ONLY): CD4 % Helper T Cell: 30 — ABNORMAL LOW

## 2011-01-29 ENCOUNTER — Encounter: Payer: Self-pay | Admitting: Dietician

## 2011-02-13 ENCOUNTER — Encounter: Payer: Self-pay | Admitting: *Deleted

## 2011-04-03 ENCOUNTER — Other Ambulatory Visit: Payer: Self-pay | Admitting: Ophthalmology

## 2011-04-14 ENCOUNTER — Other Ambulatory Visit: Payer: Self-pay | Admitting: *Deleted

## 2011-04-14 DIAGNOSIS — B20 Human immunodeficiency virus [HIV] disease: Secondary | ICD-10-CM

## 2011-04-14 MED ORDER — EFAVIRENZ-EMTRICITAB-TENOFOVIR 600-200-300 MG PO TABS: 1.0000 | ORAL_TABLET | Freq: Every day | ORAL | Status: DC

## 2011-04-14 MED ORDER — LOPINAVIR-RITONAVIR 200-50 MG PO TABS: 2.0000 | ORAL_TABLET | Freq: Two times a day (BID) | ORAL | Status: DC

## 2011-04-14 NOTE — Telephone Encounter (Signed)
Pt needs f/u appts w/ Dr. Ninetta Lights and for lab work.

## 2011-05-05 ENCOUNTER — Ambulatory Visit (INDEPENDENT_AMBULATORY_CARE_PROVIDER_SITE_OTHER): Payer: BC Managed Care – PPO | Admitting: Internal Medicine

## 2011-05-05 ENCOUNTER — Encounter: Payer: Self-pay | Admitting: Internal Medicine

## 2011-05-05 VITALS — BP 141/94 | HR 83 | Temp 97.3°F | Ht 72.0 in | Wt 210.5 lb

## 2011-05-05 DIAGNOSIS — E119 Type 2 diabetes mellitus without complications: Secondary | ICD-10-CM

## 2011-05-05 DIAGNOSIS — E785 Hyperlipidemia, unspecified: Secondary | ICD-10-CM

## 2011-05-05 DIAGNOSIS — Z79899 Other long term (current) drug therapy: Secondary | ICD-10-CM

## 2011-05-05 DIAGNOSIS — B20 Human immunodeficiency virus [HIV] disease: Secondary | ICD-10-CM

## 2011-05-05 DIAGNOSIS — I10 Essential (primary) hypertension: Secondary | ICD-10-CM

## 2011-05-05 LAB — POCT GLYCOSYLATED HEMOGLOBIN (HGB A1C): Hemoglobin A1C: 10.4

## 2011-05-05 LAB — GLUCOSE, CAPILLARY: Glucose-Capillary: 267 mg/dL — ABNORMAL HIGH (ref 70–99)

## 2011-05-05 MED ORDER — INSULIN GLARGINE 100 UNIT/ML ~~LOC~~ SOLN: 20.0000 [IU] | Freq: Every day | SUBCUTANEOUS | Status: DC

## 2011-05-05 MED ORDER — LISINOPRIL-HYDROCHLOROTHIAZIDE 20-12.5 MG PO TABS: 1.0000 | ORAL_TABLET | Freq: Every day | ORAL | Status: DC

## 2011-05-05 MED ORDER — ATORVASTATIN CALCIUM 10 MG PO TABS: 10.0000 mg | ORAL_TABLET | Freq: Every day | ORAL | Status: DC

## 2011-05-05 NOTE — Patient Instructions (Signed)
1. I restarted your Lipitor for your high blood cholesterol and increased the dose of your Lantus to 20 Units daily. I changed your blood medication to Prinzide. Please stop your previous Lisinopril from now. 2. Please take all medications as prescribed.  3. If you have worsening of your symptoms or new symptoms arise, please call the clinic [phone]), or go to the ER immediately if symptoms are severe.

## 2011-05-05 NOTE — Progress Notes (Signed)
Subjective:   Patient ID: [name] [name] male   DOB: 03/29/1970 41 y.o.   MRN: [id number]  HPI: [name] [name] is a 41 y.o. with PMH of HIV, diabetes mellitus, hypertension who presented to the clinic for a follow up visit.   His medications were adjusted on 09/06/11 due to poorly controlled DM-II. He is currently using 15 U of Lantus and 1000 mg of metformin Bid daily. He feels good today without any new complaints. He reports that he does not have polyuria and polydipsia. He measures his blood glucose 2 or 3 times a week. He did no bring in his Log meter record today. But he said that his CBG is around 190 to 200 most of the time. He run off  His Zocor 3 months ago and has not taken his Zocor since then. He is taking his HIV medications regularly.   Denies fever, chills, fatigue, headaches,  cough, chest pain, SOB,  abdominal pain,diarrhea, constipation, dysuria, urgency, frequency, hematuria, joint pain or leg swelling.    Past Medical History  Diagnosis Date  . Diabetes mellitus     type II 2006  . Hepatitis B infection     hx of hepatitis infection  . HIV infection   . Hyperlipidemia   . Hypertension   . Syphilis    Current Outpatient Prescriptions  Medication Sig Dispense Refill  . atorvastatin (LIPITOR) 10 MG tablet Take 1 tablet (10 mg total) by mouth daily.  30 tablet  5  . efavirenz-emtrictabine-tenofovir (ATRIPLA) 600-200-300 MG per tablet Take 1 tablet by mouth daily.  30 tablet  2  . glucose blood (ONE TOUCH ULTRA TEST) test strip Use to check blood sugar up to 3 times daily Dx code: 250.00   100 each  12  . insulin glargine (LANTUS SOLOSTAR) 100 UNIT/ML injection Inject 10 Units into the skin at bedtime.  10 mL  3  . Insulin Pen Needle (B-D UF III MINI PEN NEEDLES) 31G X 5 MM MISC Use to inject lantus insulin once daily  100 each  5  . lisinopril (PRINIVIL,ZESTRIL) 40 MG tablet TAKE 1 TABLET BY MOUTH EVERY DAY  90 tablet  1  . lopinavir-ritonavir (KALETRA)  200-50 MG per tablet Take 2 tablets by mouth 2 (two) times daily.  120 tablet  3  . metFORMIN (GLUCOPHAGE) 1000 MG tablet Take 0.5 tablets (500 mg total) by mouth 2 (two) times daily.  62 tablet  3  . ONETOUCH DELICA LANCETS MISC Use to check blood sugar up to 3 times daily Dx code: 250.00  100 each  12   Family History  Problem Relation Age of Onset  . Kidney disease Mother   . Diabetes Mother   . Heart disease Mother   . Kidney disease Father   . Diabetes Maternal Aunt   . Diabetes Maternal Uncle   . Coronary artery disease Maternal Grandfather   . Diabetes Maternal Grandmother    History   Social History  . Marital Status: Single    Spouse Name: N/A    Number of Children: N/A  . Years of Education: N/A   Social History Main Topics  . Smoking status: Never Smoker   . Smokeless tobacco: None  . Alcohol Use: No  . Drug Use: No  . Sexually Active: No     pt. declined condoms   Other Topics Concern  . None   Social History Narrative   Single   General: no fevers, chills, no changes in  body weight, no changes in appetite Skin: no rash HEENT: no blurry vision, hearing changes or sore throat Pulm: no dyspnea, coughing, wheezing CV: no chest pain, palpitations, shortness of breath Abd: no nausea/vomiting, abdominal pain, diarrhea/constipation GU: no dysuria, hematuria, polyuria Ext: no arthralgias, myalgias Neuro: no weakness, numbness, or tingling   Objective:  Physical Exam: Filed Vitals:   05/05/11 0851  BP: 150/100  Pulse: 80  Temp: 97.3 F (36.3 C)  TempSrc: Oral  Height: 6' (1.829 m)  Weight: 210 lb 8 oz (95.482 kg)   General: resting in bed, not in acute distress HEENT: PERRL, EOMI, no scleral icterus Cardiac: S1/S2, RRR, No murmurs, gallops or rubs Pulm: Good air movement bilaterally, Clear to auscultation bilaterally, No rales, wheezing, rhonchi or rubs. Abd: Soft,  nondistended, nontender, no rebound pain, no organomegaly, BS present Ext: No  rashes or edema, 2+DP/PT pulse bilaterally Neuro: alert and oriented X3, cranial nerves II-XII grossly intact, muscle strength 5/5 in all extremeties,  sensation to light touch intact.    Assessment & Plan:   #. DM-II: his A1c is 10.4 today. He measures his blood glucose 2 or 3 times a week. He did no bring in his Log meter record today. But he said that his CBG is around 190 to 200 most of the time. Will increase his Lantus dose to 20 U daily. Will check his Microalbumin/cre ratio today.  # HTN: his blood pressure is not at goal. His bp is 150/100 when he came in. The repeated Bp is 141/94. Will change his Lisinopril to Prinzide from now.  #. HIV: his CD4 was 370 and VL 95 on 08/23/10. He is followed up regularly by Dr. Ninetta Lights. He is taking all his medications regularly. Will continue current regimen and follow up.  # HLD:  His LD was 95 on 08/23/10. He run off  His Zocor 3 months ago and has not taken his Zocor since then. Will restart his Lipitor and check his response in 6 weeks.  # HM: He had flu shot in 11/12 in [employer] where he is working. His eye exam, pneumococcal vaccine and A1c are updated.   [name] [name]

## 2011-05-06 NOTE — Progress Notes (Signed)
agree

## 2011-05-08 ENCOUNTER — Other Ambulatory Visit: Payer: Self-pay | Admitting: Internal Medicine

## 2011-06-10 ENCOUNTER — Encounter: Payer: BC Managed Care – PPO | Admitting: Internal Medicine

## 2011-08-18 ENCOUNTER — Other Ambulatory Visit: Payer: Self-pay | Admitting: Licensed Clinical Social Worker

## 2011-08-18 DIAGNOSIS — B20 Human immunodeficiency virus [HIV] disease: Secondary | ICD-10-CM

## 2011-08-18 MED ORDER — EFAVIRENZ-EMTRICITAB-TENOFOVIR 600-200-300 MG PO TABS: 1.0000 | ORAL_TABLET | Freq: Every day | ORAL | Status: DC

## 2011-09-15 ENCOUNTER — Other Ambulatory Visit: Payer: Self-pay | Admitting: *Deleted

## 2011-09-15 DIAGNOSIS — B20 Human immunodeficiency virus [HIV] disease: Secondary | ICD-10-CM

## 2011-09-15 MED ORDER — EFAVIRENZ-EMTRICITAB-TENOFOVIR 600-200-300 MG PO TABS: 1.0000 | ORAL_TABLET | Freq: Every day | ORAL | Status: DC

## 2011-09-24 ENCOUNTER — Other Ambulatory Visit: Payer: BC Managed Care – PPO

## 2011-10-01 ENCOUNTER — Encounter: Payer: Self-pay | Admitting: Internal Medicine

## 2011-10-01 ENCOUNTER — Ambulatory Visit (INDEPENDENT_AMBULATORY_CARE_PROVIDER_SITE_OTHER): Payer: BC Managed Care – PPO | Admitting: Internal Medicine

## 2011-10-01 ENCOUNTER — Other Ambulatory Visit: Payer: Self-pay | Admitting: Infectious Diseases

## 2011-10-01 ENCOUNTER — Other Ambulatory Visit (INDEPENDENT_AMBULATORY_CARE_PROVIDER_SITE_OTHER): Payer: BC Managed Care – PPO

## 2011-10-01 ENCOUNTER — Ambulatory Visit (INDEPENDENT_AMBULATORY_CARE_PROVIDER_SITE_OTHER): Payer: BC Managed Care – PPO | Admitting: Dietician

## 2011-10-01 VITALS — BP 140/99 | HR 80 | Temp 96.8°F | Ht 72.0 in | Wt 200.2 lb

## 2011-10-01 DIAGNOSIS — E785 Hyperlipidemia, unspecified: Secondary | ICD-10-CM

## 2011-10-01 DIAGNOSIS — Z113 Encounter for screening for infections with a predominantly sexual mode of transmission: Secondary | ICD-10-CM

## 2011-10-01 DIAGNOSIS — Z0000 Encounter for general adult medical examination without abnormal findings: Secondary | ICD-10-CM

## 2011-10-01 DIAGNOSIS — Z79899 Other long term (current) drug therapy: Secondary | ICD-10-CM

## 2011-10-01 DIAGNOSIS — B20 Human immunodeficiency virus [HIV] disease: Secondary | ICD-10-CM

## 2011-10-01 DIAGNOSIS — E119 Type 2 diabetes mellitus without complications: Secondary | ICD-10-CM

## 2011-10-01 DIAGNOSIS — I10 Essential (primary) hypertension: Secondary | ICD-10-CM

## 2011-10-01 LAB — CBC WITH DIFFERENTIAL/PLATELET
Basophils Absolute: 0 10*3/uL (ref 0.0–0.1)
Basophils Relative: 0 % (ref 0–1)
Lymphocytes Relative: 47 % — ABNORMAL HIGH (ref 12–46)
MCHC: 31.6 g/dL (ref 30.0–36.0)
Monocytes Absolute: 0.4 10*3/uL (ref 0.1–1.0)
Neutro Abs: 2.5 10*3/uL (ref 1.7–7.7)
Neutrophils Relative %: 46 % (ref 43–77)
Platelets: 163 10*3/uL (ref 150–400)
RDW: 14.4 % (ref 11.5–15.5)
WBC: 5.5 10*3/uL (ref 4.0–10.5)

## 2011-10-01 LAB — COMPREHENSIVE METABOLIC PANEL
ALT: 230 U/L — ABNORMAL HIGH (ref 0–53)
AST: 190 U/L — ABNORMAL HIGH (ref 0–37)
Albumin: 4.1 g/dL (ref 3.5–5.2)
Alkaline Phosphatase: 120 U/L — ABNORMAL HIGH (ref 39–117)
BUN: 12 mg/dL (ref 6–23)
Chloride: 99 mEq/L (ref 96–112)
Potassium: 4.5 mEq/L (ref 3.5–5.3)

## 2011-10-01 LAB — LIPID PANEL
HDL: 46 mg/dL (ref >39)
LDL Cholesterol: 33 mg/dL (ref 0–99)
Total CHOL/HDL Ratio: 2.2 Ratio
Triglycerides: 122 mg/dL (ref <150)
VLDL: 24 mg/dL (ref 0–40)

## 2011-10-01 LAB — GLUCOSE, CAPILLARY

## 2011-10-01 MED ORDER — METFORMIN HCL 1000 MG PO TABS: 1000.0000 mg | ORAL_TABLET | Freq: Two times a day (BID) | ORAL | Status: DC

## 2011-10-01 MED ORDER — INSULIN GLARGINE 100 UNIT/ML ~~LOC~~ SOLN: 30.0000 [IU] | Freq: Every day | SUBCUTANEOUS | Status: DC

## 2011-10-01 MED ORDER — AMLODIPINE BESYLATE 5 MG PO TABS: 5.0000 mg | ORAL_TABLET | Freq: Every day | ORAL | Status: DC

## 2011-10-01 NOTE — Patient Instructions (Addendum)
1. I increased to your Lantus dose from 20 units to 30 units daily today. Please take 30 units Lantus daily from now. I also increased your metformin dose from 500 mg twice a day to 1000 mg twice a day. Please take 1000 mg of metformin twice a day from now. Please measure your blood sugar at least for 5 days before your next visit.  2. I added a new prescription for your high blood pressure today. Please take 5 mg of amlodipine daily from now. 3. If you have worsening of your symptoms or new symptoms arise, please call the clinic ([phone]), or go to the ER immediately if symptoms are severe.

## 2011-10-01 NOTE — Progress Notes (Signed)
Diabetes Self-Management Training (DSMT)  Follow-Up 2 Visit- seen about 1 year ago  10/01/2011 [name], identified by name and date of birth, is a 41 y.o. male with noted patient with Type 2 diabetes. Year of diabetes diagnosis: 2005  ASSESSMENT Patient concerns are Glycemic control.  There were no vitals taken for this visit. There is no height or weight on file to calculate BMI. Lab Results  Component Value Date   LDLCALC 95 08/23/2010   Lab Results  Component Value Date   HGBA1C >14.0 10/01/2011   Labs reviewed.  Family history of diabetes: Yes- mother is on dialysis and Intensive insulin therapy has problem with hypoglycemia Support systems:friends Special needs: None, possibly feeling associated with diabetes- grandmother with amputations and mother on dialysis Prior DM Education: Yes Patients belief/attitude about diabetes: Very frustrated that no matter what he does, Diabetes can be controlled. Self foot exams daily: Yes Diabetes Complications: None    Medications See Medications list.  Is interested in learning more and Needs skills/knowledge review   Exercise Plan Doing -walks track at school in evenings with friends.   Self-Monitoring Monitor: One touch-reports he has plenty of supplies Frequency of testing: stopped testing Breakfast: unknown per patient, but never < 200-300 Hyperglycemia: Yes Daily Hypoglycemia: No   Meal Planning Some knowledge and reassess at next visit   Assessment comments: patient reports that taking injections was an adjustment that he has been able to handle. He wants to self manage his diabetes and verbalizes frustration that despite taking his medications, exercising and eating healthier and lower carbs that he has been unable to lower his blood sugar to his desired target of 100s. [name] [name] the desire to live a quality life as motivating him to take care of his diabetes.      INDIVIDUAL DIABETES EDUCATION PLAN:    Diabetes disease state Medication Nutrition Monitoring Acute complication: _______________________________________________________________________  Intervention TOPICS COVERED TODAY:  Diabetes disease state: type 1 vs. Type 2, similarities and differences. Medication  Reviewed patients medication for diabetes, action, purpose, timing of dose and side effects. Nutrition: introduced carb countiing /consistency, concept of insulin needing to match carbs whether endogenous or exogenous Monitoring : reviewed purpose of self monitoring along with frequency of SMBG. Acute complication  Discussed and identified what can increase and lower blood sugar. Goal setting  Helped patient develop diabetes management plan for lowering his a1c and risk to compications  PATIENTS GOALS/PLAN (copy and paste in patient instructions so patient receives a copy): 1.  Learning Objective:        Knowledge of how to self titrate insulin 2.  Behavioral Objective:         Medications: To improve blood glucose levels, I will take my medication as prescribed Most of the time 100% Monitoring: To identify blood glucose trends, I will test my blood glucose once daily day Never 0%  Personalized Follow-Up Plan for Ongoing Self Management Support:  Doctor's Office, family and CDE visits ______________________________________________________________________   Outcomes Expected outcomes:Demonstrated interest in learning.Expect positive changes in lifestyle. Self-care Barriers: Coping skills, adjustment to having diabetes Education material provided: Patient to contact team via Phone if problems or questions. Time in: 0950     Time out: 1050 Future DSMT - 4 wks   [name] [name]   Discussed self titration plan with Dr. Clyde Lundborg who agrees to plan.

## 2011-10-01 NOTE — Progress Notes (Signed)
Patient ID: [name] [name], male   DOB: [birth year], 41 y.o.   MRN: [id number]  Subjective:   Patient ID: [name] [name] male   DOB: 05-01-1970 41 y.o.   MRN: [id number]  HPI: [name] is a 41 y.o. with a past medical history as outlined below, who presents for a regular checkup.  He reports that he is taking Lantus 20 units daily. He occasionally measues his blood sugar which has been between 250-300 in the morning at most of the times. He has some symptoms for hyperglycemia, including polydipsia and polyuria. He does not have dysuria or burning sensation on urination.  Regarding his HIV, he has been followed up by Dr. Ninetta [name]. His CD4 was 370 and viral load was less than 20 on 09/02/11. He has an appointment at 10/08/11 with Dr. Ninetta [name].  Regarding his hyperlipidemia, he is currently taking Lipitor 10 mg daily.  His last LDL was 95 at 08/23/10. His ALT and AST were normal at the same time. He does not have any side effects, such as muscle pain.  Regarding his hypertension, his currently taking Prinzide. His blood pressure is 144/95 when he came in to the clinic. Repeated blood pressure is 140/99.     Past Medical History  Diagnosis Date  . Diabetes mellitus     type II 2006  . Hepatitis B infection     hx of hepatitis infection  . HIV infection   . Hyperlipidemia   . Hypertension   . Syphilis    Current Outpatient Prescriptions  Medication Sig Dispense Refill  . lopinavir-ritonavir (KALETRA) 200-50 MG per tablet Take 2 tablets by mouth 2 (two) times daily.      Marland Kitchen atorvastatin (LIPITOR) 10 MG tablet Take 1 tablet (10 mg total) by mouth daily.  30 tablet  5  . efavirenz-emtrictabine-tenofovir (ATRIPLA) 600-200-300 MG per tablet Take 1 tablet by mouth daily.  30 tablet  0  . glucose blood (ONE TOUCH ULTRA TEST) test strip Use to check blood sugar up to 3 times daily Dx code: 250.00   100 each  12  . insulin glargine (LANTUS SOLOSTAR) 100 UNIT/ML injection Inject 20  Units into the skin at bedtime.  10 mL  3  . Insulin Pen Needle (B-D UF III MINI PEN NEEDLES) 31G X 5 MM MISC Use to inject lantus insulin once daily  100 each  5  . lisinopril-hydrochlorothiazide (PRINZIDE) 20-12.5 MG per tablet Take 1 tablet by mouth daily.  30 tablet  11  . metFORMIN (GLUCOPHAGE) 1000 MG tablet Take 0.5 tablets (500 mg total) by mouth 2 (two) times daily.  62 tablet  3  . ONETOUCH DELICA LANCETS MISC Use to check blood sugar up to 3 times daily Dx code: 250.00  100 each  12   Family History  Problem Relation Age of Onset  . Kidney disease Mother   . Diabetes Mother   . Heart disease Mother   . Kidney disease Father   . Diabetes Maternal Aunt   . Diabetes Maternal Uncle   . Coronary artery disease Maternal Grandfather   . Diabetes Maternal Grandmother    History   Social History  . Marital Status: Single    Spouse Name: N/A    Number of Children: N/A  . Years of Education: N/A   Social History Main Topics  . Smoking status: Never Smoker   . Smokeless tobacco: None  . Alcohol Use: No  . Drug Use: No  . Sexually Active:  No     pt. declined condoms   Other Topics Concern  . None   Social History Narrative   Single   Review of Systems: General: no fevers, chills, no changes in body weight, no changes in appetite Skin: no rash HEENT: no blurry vision, hearing changes or sore throat Pulm: no dyspnea, coughing, wheezing CV: no chest pain, palpitations, shortness of breath Abd: no nausea/vomiting, abdominal pain, diarrhea/constipation GU: no dysuria, hematuria, has polyuria Ext: no arthralgias, myalgias Neuro: no weakness, numbness, or tingling   Objective:  Physical Exam: Filed Vitals:   10/01/11 0906  BP: 144/95  Pulse: 78  Temp: 96.8 F (36 C)  TempSrc: Oral  Height: 6' (1.829 m)  Weight: 200 lb 3.2 oz (90.81 kg)   General: resting in bed, not in acute distress HEENT: PERRL, EOMI, no scleral icterus Cardiac: S1/S2, RRR, No murmurs,  gallops or rubs Pulm: Good air movement bilaterally, Clear to auscultation bilaterally, No rales, wheezing, rhonchi or rubs. Abd: Soft,  nondistended, nontender, no rebound pain, no organomegaly, BS present Ext: No rashes or edema, 2+DP/PT pulse bilaterally Musculoskeletal: No joint deformities, erythema, or stiffness, ROM full and no nontender Skin: no rashes. No skin bruise. Neuro: alert and oriented X3, cranial nerves II-XII grossly intact, muscle strength 5/5 in all extremeties,  sensation to light touch intact.  Psych.: patient is not psychotic, no suicidal or hemocidal ideation.   Assessment & Plan:

## 2011-10-01 NOTE — Assessment & Plan Note (Signed)
Patient would like to postpone eye examination today.

## 2011-10-01 NOTE — Patient Instructions (Addendum)
Try to spread carbs (starches, fruits)  out by eating 4-6 servings at each meal and about 1-2 serving at snacks.   Increase insulin to 30 for 1 week, then if blood sugar before breakfast is > 150 mg/dl. Begin increasing the Lantus 1-2 units each day until morning blood sugar is < 150 mg/dl or it's time to return to office in 1 month.   Please bring worksheet back with your showing dates, blood sugar and how much Lantus you are taking.   Lupita [name] [phone]

## 2011-10-01 NOTE — Assessment & Plan Note (Signed)
He is currently taking Lipitor 10 mg daily.  His last LDL was 95 at 08/23/10. His ALT and AST were normal at the same time. He does not have any side effects, such as muscle pain. Will check his lipid panel today.

## 2011-10-01 NOTE — Assessment & Plan Note (Signed)
His blood pressure is still elevated. His blood pressure is 144/95. We'll add low dose of amlodipine, 5 mg daily.

## 2011-10-01 NOTE — Assessment & Plan Note (Signed)
His diabetes is very poorly controlled. He's currently taking Lantus 20 units daily. He occasionally measures his blood sugar which has been between 250-300 in the morning at most of the times. He has some symptoms for hyperglycemia, including polydipsia and polyuria. His A1c is hight than 14.0 today.  - Will increase his Lantus to 30 units daily. Patient was asked to measure his blood sugar at least once a day in the morning and bring back his meter in next visit. Will readjust his insulin dosage in next visit. - Will increase his metformin dose from 500 mg to 1000 mg twice a day. - Our diabetic educator, Lupita [name] educated the patient to increase his Lantus dose by 1 to 2 units if his blood sugar is higher than 150 in AM.

## 2011-10-02 ENCOUNTER — Telehealth: Payer: Self-pay | Admitting: Dietician

## 2011-10-02 LAB — URINALYSIS, ROUTINE W REFLEX MICROSCOPIC
Bilirubin Urine: NEGATIVE
Hgb urine dipstick: NEGATIVE
Ketones, ur: NEGATIVE mg/dL
Specific Gravity, Urine: 1.037 — ABNORMAL HIGH (ref 1.005–1.030)
Urobilinogen, UA: 0.2 mg/dL (ref 0.0–1.0)
pH: 5.5 (ref 5.0–8.0)

## 2011-10-02 LAB — URINALYSIS, MICROSCOPIC ONLY
Crystals: NONE SEEN
Squamous Epithelial / LPF: NONE SEEN

## 2011-10-02 LAB — T-HELPER CELL (CD4) - (RCID CLINIC ONLY): CD4 % Helper T Cell: 33 % (ref 33–55)

## 2011-10-02 NOTE — Telephone Encounter (Signed)
Tried calling patient to tell him that Dr. Clyde Lundborg said it was fine for him to self increase his lantus insulin to goal of fasting blood sugar < 150 mg/dl.   Also want to discuss concern about him not taking lantus as prescribed. Per [facility] his refill history indicates ~300 days of insulin dose of 20 units/day refilled over past year.

## 2011-10-03 LAB — HIV-1 RNA QUANT-NO REFLEX-BLD
HIV 1 RNA Quant: <20 copies/mL (ref <20)
HIV-1 RNA Quant, Log: <1.3 {Log} (ref <1.30)

## 2011-10-06 NOTE — Telephone Encounter (Signed)
Have not heard back from patient. Will mail letter

## 2011-10-08 ENCOUNTER — Ambulatory Visit: Payer: BC Managed Care – PPO | Admitting: Infectious Diseases

## 2011-10-08 ENCOUNTER — Telehealth: Payer: Self-pay | Admitting: *Deleted

## 2011-10-08 NOTE — Telephone Encounter (Signed)
Message left to call [facility] to make a new MD app.

## 2011-10-31 ENCOUNTER — Other Ambulatory Visit: Payer: Self-pay | Admitting: Internal Medicine

## 2011-11-03 ENCOUNTER — Other Ambulatory Visit: Payer: Self-pay | Admitting: Licensed Clinical Social Worker

## 2011-11-03 DIAGNOSIS — B20 Human immunodeficiency virus [HIV] disease: Secondary | ICD-10-CM

## 2011-11-03 DIAGNOSIS — E119 Type 2 diabetes mellitus without complications: Secondary | ICD-10-CM

## 2011-11-03 MED ORDER — METFORMIN HCL 1000 MG PO TABS: 1000.0000 mg | ORAL_TABLET | Freq: Two times a day (BID) | ORAL | Status: DC

## 2011-11-03 MED ORDER — EFAVIRENZ-EMTRICITAB-TENOFOVIR 600-200-300 MG PO TABS: 1.0000 | ORAL_TABLET | Freq: Every day | ORAL | Status: DC

## 2011-11-04 ENCOUNTER — Encounter: Payer: BC Managed Care – PPO | Admitting: Internal Medicine

## 2011-11-04 ENCOUNTER — Other Ambulatory Visit: Payer: Self-pay | Admitting: Licensed Clinical Social Worker

## 2011-11-04 DIAGNOSIS — B20 Human immunodeficiency virus [HIV] disease: Secondary | ICD-10-CM

## 2011-11-04 MED ORDER — EFAVIRENZ-EMTRICITAB-TENOFOVIR 600-200-300 MG PO TABS: 1.0000 | ORAL_TABLET | Freq: Every day | ORAL | Status: DC

## 2011-11-05 ENCOUNTER — Other Ambulatory Visit: Payer: Self-pay | Admitting: *Deleted

## 2011-11-05 DIAGNOSIS — E119 Type 2 diabetes mellitus without complications: Secondary | ICD-10-CM

## 2011-11-05 MED ORDER — METFORMIN HCL 1000 MG PO TABS: 1000.0000 mg | ORAL_TABLET | Freq: Two times a day (BID) | ORAL | Status: DC

## 2011-12-29 ENCOUNTER — Other Ambulatory Visit: Payer: Self-pay | Admitting: Internal Medicine

## 2011-12-29 DIAGNOSIS — E119 Type 2 diabetes mellitus without complications: Secondary | ICD-10-CM

## 2012-03-30 ENCOUNTER — Encounter: Payer: Self-pay | Admitting: Internal Medicine

## 2012-06-23 ENCOUNTER — Encounter: Payer: Self-pay | Admitting: *Deleted

## 2012-06-26 ENCOUNTER — Other Ambulatory Visit: Payer: Self-pay | Admitting: Internal Medicine

## 2012-06-26 DIAGNOSIS — I10 Essential (primary) hypertension: Secondary | ICD-10-CM

## 2012-07-17 ENCOUNTER — Other Ambulatory Visit: Payer: Self-pay | Admitting: Internal Medicine

## 2012-07-17 DIAGNOSIS — E785 Hyperlipidemia, unspecified: Secondary | ICD-10-CM

## 2012-07-20 ENCOUNTER — Encounter: Payer: Self-pay | Admitting: Internal Medicine

## 2012-09-23 ENCOUNTER — Other Ambulatory Visit: Payer: Self-pay

## 2012-10-11 ENCOUNTER — Other Ambulatory Visit: Payer: Self-pay | Admitting: Internal Medicine

## 2012-11-19 ENCOUNTER — Encounter: Payer: Self-pay | Admitting: Internal Medicine

## 2012-12-10 ENCOUNTER — Other Ambulatory Visit: Payer: Self-pay | Admitting: Internal Medicine

## 2013-01-02 ENCOUNTER — Other Ambulatory Visit: Payer: Self-pay | Admitting: Internal Medicine

## 2013-01-02 DIAGNOSIS — E119 Type 2 diabetes mellitus without complications: Secondary | ICD-10-CM

## 2013-02-27 ENCOUNTER — Other Ambulatory Visit: Payer: Self-pay | Admitting: Internal Medicine

## 2013-02-27 DIAGNOSIS — E119 Type 2 diabetes mellitus without complications: Secondary | ICD-10-CM

## 2013-04-19 ENCOUNTER — Encounter (INDEPENDENT_AMBULATORY_CARE_PROVIDER_SITE_OTHER): Payer: BC Managed Care – PPO | Admitting: Ophthalmology

## 2013-04-19 ENCOUNTER — Encounter: Payer: Self-pay | Admitting: Internal Medicine

## 2013-04-19 ENCOUNTER — Ambulatory Visit (INDEPENDENT_AMBULATORY_CARE_PROVIDER_SITE_OTHER): Payer: BC Managed Care – PPO | Admitting: Internal Medicine

## 2013-04-19 VITALS — BP 169/98 | HR 89 | Temp 98.5°F | Ht 72.0 in | Wt 193.6 lb

## 2013-04-19 DIAGNOSIS — E11319 Type 2 diabetes mellitus with unspecified diabetic retinopathy without macular edema: Secondary | ICD-10-CM

## 2013-04-19 DIAGNOSIS — H35039 Hypertensive retinopathy, unspecified eye: Secondary | ICD-10-CM

## 2013-04-19 DIAGNOSIS — H2510 Age-related nuclear cataract, unspecified eye: Secondary | ICD-10-CM

## 2013-04-19 DIAGNOSIS — I10 Essential (primary) hypertension: Secondary | ICD-10-CM

## 2013-04-19 DIAGNOSIS — Z23 Encounter for immunization: Secondary | ICD-10-CM

## 2013-04-19 DIAGNOSIS — Z0000 Encounter for general adult medical examination without abnormal findings: Secondary | ICD-10-CM

## 2013-04-19 DIAGNOSIS — H43819 Vitreous degeneration, unspecified eye: Secondary | ICD-10-CM

## 2013-04-19 DIAGNOSIS — E785 Hyperlipidemia, unspecified: Secondary | ICD-10-CM

## 2013-04-19 DIAGNOSIS — E11311 Type 2 diabetes mellitus with unspecified diabetic retinopathy with macular edema: Secondary | ICD-10-CM

## 2013-04-19 DIAGNOSIS — E1165 Type 2 diabetes mellitus with hyperglycemia: Secondary | ICD-10-CM

## 2013-04-19 DIAGNOSIS — B20 Human immunodeficiency virus [HIV] disease: Secondary | ICD-10-CM

## 2013-04-19 DIAGNOSIS — E1139 Type 2 diabetes mellitus with other diabetic ophthalmic complication: Secondary | ICD-10-CM

## 2013-04-19 DIAGNOSIS — E119 Type 2 diabetes mellitus without complications: Secondary | ICD-10-CM

## 2013-04-19 LAB — COMPLETE METABOLIC PANEL WITH GFR
ALK PHOS: 54 U/L (ref 39–117)
ALT: 59 U/L — AB (ref 0–53)
AST: 46 U/L — ABNORMAL HIGH (ref 0–37)
Albumin: 3.2 g/dL — ABNORMAL LOW (ref 3.5–5.2)
BILIRUBIN TOTAL: 0.3 mg/dL (ref 0.2–1.2)
BUN: 7 mg/dL (ref 6–23)
CO2: 31 meq/L (ref 19–32)
CREATININE: 0.76 mg/dL (ref 0.50–1.35)
Calcium: 8.5 mg/dL (ref 8.4–10.5)
Chloride: 104 mEq/L (ref 96–112)
Glucose, Bld: 155 mg/dL — ABNORMAL HIGH (ref 70–99)
Potassium: 3.8 mEq/L (ref 3.5–5.3)
Sodium: 137 mEq/L (ref 135–145)
Total Protein: 8 g/dL (ref 6.0–8.3)

## 2013-04-19 LAB — LIPID PANEL
CHOL/HDL RATIO: 3.4 ratio
CHOLESTEROL: 120 mg/dL (ref 0–200)
HDL: 35 mg/dL — ABNORMAL LOW (ref >39)
LDL Cholesterol: 67 mg/dL (ref 0–99)
TRIGLYCERIDES: 89 mg/dL (ref <150)
VLDL: 18 mg/dL (ref 0–40)

## 2013-04-19 LAB — HEMOGLOBIN A1C
Hgb A1c MFr Bld: 9 % — ABNORMAL HIGH (ref <5.7)
Mean Plasma Glucose: 212 mg/dL — ABNORMAL HIGH (ref <117)

## 2013-04-19 LAB — GLUCOSE, CAPILLARY: GLUCOSE-CAPILLARY: 175 mg/dL — AB (ref 70–99)

## 2013-04-19 NOTE — Assessment & Plan Note (Signed)
HIV use to be well controlled. He lost followup with Dr. [name]. He stopped taking his HIV medications since 10/2011. -will check his CD4 and VL today. -He is to go back to see Dr. [name].

## 2013-04-19 NOTE — Assessment & Plan Note (Signed)
LDL was 33 on 10/01/11 when he was on lipitor. He stopped taking Lipitor since 10/2011. -will check his lipid profile.

## 2013-04-19 NOTE — Assessment & Plan Note (Signed)
-  will give flu shot today -will address other HM issue in next visit in 2 weeks.

## 2013-04-19 NOTE — Assessment & Plan Note (Signed)
    Lab Results  Component Value Date   HGBA1C >14.0 10/01/2011   HGBA1C 10.4 05/05/2011   HGBA1C 13.7 09/06/2010     Assessment: Diabetes control: poor control (HgbA1C >9%) Progress toward A1C goal:  unable to assess Comments:   Plan: Medications:  continue current medications Home glucose monitoring: Frequency:   Timing:   Instruction/counseling given: reminded to bring blood glucose meter & log to each visit Educational resources provided:   Self management tools provided:   Other plans: Patient's previous A1c was >14 on 10/01/11. He reports that he is taking metformin and Lantus. Not very sure whether he has been doing so. Will check A1C and continue current regimen (metformin 1000 mg twice a day and Lantus 30 units daily). followup in 2 weeks .

## 2013-04-19 NOTE — Progress Notes (Signed)
Patient ID: [name] [name], male   DOB: 11/25/1970, 43 y.o.   MRN: [id number] Subjective:   Patient ID: [name] [name] male   DOB: 1970-04-14 43 y.o.   MRN: [id number]  CC:   Follow up visit.  HPI:  [name] is a 43 y.o. man with past medical history as outlined below, who presents for a followup visit today  Patient was last seen in clinic on 10/01/11. He did not show up in the followup appoitment until today.  1. DM-II: The patient had very poorly controled DM-II with A1C >14 on 10/01/11. Patient reports that he has been taking Lantus 30 units at bedtime daily, and metformin 1000 mg twice a day. He does not have symptoms for hyper- or hypoglycemia. He reports that he started having blurry vision since 02/27/13. He does not have chest pain, shortness of breath, weakness, numbness or decreased sensation in his extremities. He saw an ophthalmologist,  Dr. Rodman Key in this AM, he was told to have "retinal edema". He was started with Besivance (Besifloxacine) eye drop today. He has a follow up appointment with Dr. Marquis Lunch 05/17/13.  2. HIV: He used to follow up with Dr. [name]. Last seen was on 08/2011. He states that he stopped taking his HIV medications (Atripla and Kaletra) since 10/2011 because that the HIV medication mess up his stomach. Today he does not have fever, chills, sore throat, difficult in swallowing or swollen glands. His last CD4 was 800 and VL<20 on 10/01/11.   3.  HLD:  LDL was 10/01/11 when he was on Lipitor 10 mg daily. He stopped taking Lipitor since 10/2011.  4. HTN: Patient reports that he is taking amlodipine 5 mg daily. Today blood pressure is 169/98 mmHg. He does not have chest pain, shortness of breath, or leg edema.   ROS:  Denies fever, chills, headaches, cough, chest pain, SOB,  abdominal pain, diarrhea, constipation, dysuria, urgency, frequency, hematuria, joint pain or leg swelling. Has blurry vision bilaterally. No eye pain or discharge.   Past Medical  History  Diagnosis Date  . Diabetes mellitus     type II 2006  . Hepatitis B infection     hx of hepatitis infection  . HIV infection   . Hyperlipidemia   . Hypertension   . Syphilis    Current Outpatient Prescriptions  Medication Sig Dispense Refill  . amLODipine (NORVASC) 5 MG tablet TAKE 1 TABLET BY MOUTH EVERY DAY  30 tablet  3  . Insulin Pen Needle (B-D UF III MINI PEN NEEDLES) 31G X 5 MM MISC Use to inject lantus insulin once daily  100 each  5  . LANTUS SOLOSTAR 100 UNIT/ML SOPN INJECT 30 UNITS SUB-Q AT BEDTIME  3 mL  15  . metFORMIN (GLUCOPHAGE) 1000 MG tablet Take 1 tablet (1,000 mg total) by mouth 2 (two) times daily.  62 tablet  11  . atorvastatin (LIPITOR) 10 MG tablet TAKE 1 TABLET BY MOUTH EVERY DAY  30 tablet  5  . B-D UF III MINI PEN NEEDLES 31G X 5 MM MISC USE TO INJECT LANTUS ONCE A DAY  100 each  11  . efavirenz-emtricitabine-tenofovir (ATRIPLA) 600-200-300 MG per tablet Take 1 tablet by mouth daily.  30 tablet  1  . glucose blood (ONE TOUCH ULTRA TEST) test strip Use to check blood sugar up to 3 times daily Dx code: 250.00   100 each  12  . insulin glargine (LANTUS SOLOSTAR) 100 UNIT/ML injection Inject 30 Units into  the skin at bedtime.  10 mL  3  . lisinopril-hydrochlorothiazide (PRINZIDE) 20-12.5 MG per tablet Take 1 tablet by mouth daily.  30 tablet  11  . lopinavir-ritonavir (KALETRA) 200-50 MG per tablet Take 2 tablets by mouth 2 (two) times daily.      Glory Rosebush DELICA LANCETS MISC Use to check blood sugar up to 3 times daily Dx code: 250.00  100 each  12   No current facility-administered medications for this visit.   Family History  Problem Relation Age of Onset  . Kidney disease Mother   . Diabetes Mother   . Heart disease Mother   . Kidney disease Father   . Diabetes Maternal Aunt   . Diabetes Maternal Uncle   . Coronary artery disease Maternal Grandfather   . Diabetes Maternal Grandmother    History   Social History  . Marital Status:  Single    Spouse Name: N/A    Number of Children: N/A  . Years of Education: N/A   Social History Main Topics  . Smoking status: Never Smoker   . Smokeless tobacco: None  . Alcohol Use: No  . Drug Use: No  . Sexual Activity: No     Comment: pt. declined condoms   Other Topics Concern  . None   Social History Narrative   Single    Review of Systems: Full 14-point review of systems otherwise negative. See HPI.   Objective:  Physical Exam: Filed Vitals:   04/19/13 1609  BP: 169/98  Pulse: 89  Temp: 98.5 F (36.9 C)  TempSrc: Oral  Height: 6' (1.829 m)  Weight: 193 lb 9.6 oz (87.816 kg)  SpO2: 100%   Constitutional: Vital signs reviewed.  Patient is a well-developed and well-nourished, in no acute distress and cooperative with exam.   HEENT:  Head: Normocephalic and atraumatic Mouth: no erythema or exudates, MMM Eyes: PERRL, EOMI, conjunctivae normal, No scleral icterus.  Neck: Supple, Trachea midline normal ROM, No JVD  Cardiovascular: RRR, S1 normal, S2 normal, no MRG, pulses symmetric and intact bilaterally Pulmonary/Chest: CTAB, no wheezes, rales, or rhonchi Abdominal: Soft. Non-tender, non-distended, bowel sounds are normal, no masses, organomegaly, or guarding present.  GU: no CVA tenderness Musculoskeletal: No joint deformities, erythema, or stiffness, ROM full and non-tender Extremities: No leg edema Hematology: no cervical, inginal, or axillary adenopathy.  Neurological: A&O x3, Strength is normal and symmetric bilaterally, cranial nerve II-XII are grossly intact, no focal motor deficit, sensory intact to light touch bilaterally.  Skin: Warm, dry and intact. No rash, cyanosis, or clubbing.  Psychiatric: Normal mood and affect. No suicidal or homicidal ideation.  Assessment & Plan:

## 2013-04-19 NOTE — Assessment & Plan Note (Signed)
BP Readings from Last 3 Encounters:  04/19/13 169/98  10/01/11 140/99  05/05/11 141/94    Lab Results  Component Value Date   NA 134* 10/01/2011   K 4.5 10/01/2011   CREATININE 0.93 10/01/2011    Assessment: Blood pressure control: moderately elevated Progress toward BP goal:  deteriorated Comments:   Plan: Medications:  continue current medications Educational resources provided:   Self management tools provided:   Other plans: Patient is currently taking amlodipine 5 mg daily. His blood pressure is elevated at 169/98. Since he lost followup since 10/2011, will get his basic lab tests today. Patient should be benefited from adding an ACE inhibitor given his poorly controled DM-II. Will make medication adjustment in next visit in 2 weeks.

## 2013-04-19 NOTE — Patient Instructions (Addendum)
1. Please come back in 2 weeks when we will have all your lab tests done, so we can make next plan for you.   2. If you have worsening of your symptoms or new symptoms arise, please call the clinic [phone]), or go to the ER immediately if symptoms are severe.  Please bring in all your medication bottles with you in next visit.

## 2013-04-20 ENCOUNTER — Telehealth: Payer: Self-pay | Admitting: Internal Medicine

## 2013-04-20 LAB — MICROALBUMIN / CREATININE URINE RATIO
CREATININE, URINE: 87.1 mg/dL
Microalb Creat Ratio: 246.2 mg/g — ABNORMAL HIGH (ref 0.0–30.0)
Microalb, Ur: 21.44 mg/dL — ABNORMAL HIGH (ref 0.00–1.89)

## 2013-04-20 LAB — T-HELPER CELLS (CD4) COUNT (NOT AT ARMC)
CD4 T CELL HELPER: 12 % — AB (ref 33–55)
CD4 T Cell Abs: 140 /uL — ABNORMAL LOW (ref 400–2700)

## 2013-04-20 NOTE — Progress Notes (Signed)
Case discussed with Dr. Blaine Hamper at the time of the visit.  We reviewed the resident's history and exam and pertinent patient test results.  I agree with the assessment, diagnosis, and plan of care documented in the resident's note.

## 2013-04-20 NOTE — Telephone Encounter (Signed)
Called Dr. Tempie Hoist office.  Office is faxing office records.

## 2013-04-21 ENCOUNTER — Telehealth: Payer: Self-pay | Admitting: *Deleted

## 2013-04-21 LAB — HIV-1 RNA QUANT-NO REFLEX-BLD
HIV 1 RNA Quant: 85328 copies/mL — ABNORMAL HIGH (ref <20)
HIV-1 RNA QUANT, LOG: 4.93 {Log} — AB (ref <1.30)

## 2013-04-21 NOTE — Telephone Encounter (Signed)
Patient called for appt to get back in care and scheduled for 05/11/13. He has been off his HIV meds since 09/2011 stating it was giving him headaches, but his glucose had not been in control. His glucose is good now and wanted to know if he should restart his HIV meds. Patient advised to wait until his appointment with Dr. [name]. He had his labs done at PCP 04/19/13. Myrtis Hopping

## 2013-05-05 ENCOUNTER — Other Ambulatory Visit: Payer: Self-pay | Admitting: Internal Medicine

## 2013-05-05 DIAGNOSIS — I10 Essential (primary) hypertension: Secondary | ICD-10-CM

## 2013-05-11 ENCOUNTER — Other Ambulatory Visit: Payer: Self-pay | Admitting: Internal Medicine

## 2013-05-11 ENCOUNTER — Other Ambulatory Visit: Payer: Self-pay | Admitting: Infectious Diseases

## 2013-05-11 ENCOUNTER — Ambulatory Visit (INDEPENDENT_AMBULATORY_CARE_PROVIDER_SITE_OTHER): Payer: BC Managed Care – PPO | Admitting: Internal Medicine

## 2013-05-11 ENCOUNTER — Ambulatory Visit (INDEPENDENT_AMBULATORY_CARE_PROVIDER_SITE_OTHER): Payer: BC Managed Care – PPO | Admitting: Infectious Diseases

## 2013-05-11 ENCOUNTER — Encounter: Payer: Self-pay | Admitting: Internal Medicine

## 2013-05-11 ENCOUNTER — Encounter: Payer: Self-pay | Admitting: Infectious Diseases

## 2013-05-11 ENCOUNTER — Other Ambulatory Visit: Payer: Self-pay | Admitting: Licensed Clinical Social Worker

## 2013-05-11 VITALS — BP 185/100 | HR 86 | Temp 98.4°F | Ht 72.0 in | Wt 192.0 lb

## 2013-05-11 VITALS — BP 164/96 | HR 79 | Temp 98.0°F | Ht 72.0 in | Wt 192.1 lb

## 2013-05-11 DIAGNOSIS — E785 Hyperlipidemia, unspecified: Secondary | ICD-10-CM

## 2013-05-11 DIAGNOSIS — Z113 Encounter for screening for infections with a predominantly sexual mode of transmission: Secondary | ICD-10-CM

## 2013-05-11 DIAGNOSIS — E119 Type 2 diabetes mellitus without complications: Secondary | ICD-10-CM

## 2013-05-11 DIAGNOSIS — B20 Human immunodeficiency virus [HIV] disease: Secondary | ICD-10-CM

## 2013-05-11 DIAGNOSIS — I10 Essential (primary) hypertension: Secondary | ICD-10-CM

## 2013-05-11 LAB — GLUCOSE, CAPILLARY: Glucose-Capillary: 217 mg/dL — ABNORMAL HIGH (ref 70–99)

## 2013-05-11 LAB — RPR: RPR: REACTIVE — AB

## 2013-05-11 LAB — RPR TITER: RPR Titer: 1:512 {titer} — AB

## 2013-05-11 MED ORDER — METFORMIN HCL 1000 MG PO TABS: 1000.0000 mg | ORAL_TABLET | Freq: Two times a day (BID) | ORAL | Status: DC

## 2013-05-11 MED ORDER — SULFAMETHOXAZOLE-TMP DS 800-160 MG PO TABS: 1.0000 | ORAL_TABLET | ORAL | Status: DC

## 2013-05-11 MED ORDER — LISINOPRIL 10 MG PO TABS: 10.0000 mg | ORAL_TABLET | Freq: Every day | ORAL | Status: DC

## 2013-05-11 NOTE — Assessment & Plan Note (Addendum)
Will check his HLA and genotype. If these are (-) will change him to triumeq. Some concern about increased CV risk with ABC but is better choice than stribild (may increase his Cr and he is already at risk for renal disease). Will start him on bactrim til we can be assured this CD4 has improved. He will call back in 2 weeks for results and will change meds then.  Stress to pt importance of taking ART and that he may be seen here anytime, and we can help him with med assistance.  Spoke with lab and there is no hx of genotypes.... Not sure why he was on KLT/Atripla.Marland Kitchen...will get his paper chart as well.

## 2013-05-11 NOTE — Patient Instructions (Addendum)
1. Please switch your amlodipine to Lisinopril 10 mg daily (stop taking amlodipine) 2. Please take Metformin 1000 mg in twice a day. Please increase your lantus to 42 units daily.  3. Please measure your blood sugar level ideally 3 times a day, if it is too difficult to do it, please measure your blood sugar level once a day, and 3 times a day for at least 3 days before coming back for next visit. 4. If you have worsening of your symptoms or new symptoms arise, please call the clinic [phone]), or go to the ER immediately if symptoms are severe.  Please bring in all your medication bottles with you in next visit.

## 2013-05-11 NOTE — Assessment & Plan Note (Signed)
Appears to have improved.

## 2013-05-11 NOTE — Assessment & Plan Note (Addendum)
BP Readings from Last 3 Encounters:  05/11/13 164/96  05/11/13 185/100  04/19/13 169/98    Lab Results  Component Value Date   NA 137 04/19/2013   K 3.8 04/19/2013   CREATININE 0.76 04/19/2013    Assessment: Blood pressure control: mildly elevated Progress toward BP goal:  deteriorated Comments:   Plan: Medications:  will switch his amlodipine to lisinopril 10 mg daily and reevaluate in one month. Educational resources provided: brochure Self management tools provided: home blood pressure logbook Other plans: given his poorly controled DM-II and elevated Microalbumin/Cre ratio, will switch amlodipine to lisinopril 10 mg daily and reevaluate in one month.

## 2013-05-11 NOTE — Progress Notes (Signed)
   Subjective:    Patient ID: [name] [name], male    DOB: [birth year], 43 y.o.   MRN: [id number]  HPI 43 yo M with hx of DM2, HTN and HIV+. He was previously on atripla (and KLT?). He quit taking these in 2013 due to N/V.  Has had change in job, transportation issues. Started having vision problems and was seen by ophtho, IM. Has been getting vitreal injecitons.  Last IM visit was told he no longer needed statin.   HIV 1 RNA Quant (copies/mL)  Date Value  04/19/2013 85328*  10/01/2011 <20   08/23/2010 <20      CD4 T Cell Abs (/uL)  Date Value  04/19/2013 140*  10/01/2011 800   08/23/2010 370*   Lab Results  Component Value Date   CHOL 120 04/19/2013   HDL 35* 04/19/2013   LDLCALC 67 04/19/2013   TRIG 89 04/19/2013   CHOLHDL 3.4 04/19/2013     Review of Systems  Constitutional: Negative for appetite change and unexpected weight change.  Eyes: Positive for visual disturbance.  Gastrointestinal: Negative for diarrhea and constipation.  Endocrine: Negative for polyuria.  Genitourinary: Negative for difficulty urinating.  Neurological: Negative for numbness.       Objective:   Physical Exam  Constitutional: He appears well-developed and well-nourished.  HENT:  Mouth/Throat: No oropharyngeal exudate.  Eyes: EOM are normal. Pupils are equal, round, and reactive to light.  Neck: Neck supple.  Cardiovascular: Normal rate, regular rhythm and normal heart sounds.   Pulmonary/Chest: Effort normal and breath sounds normal.  Abdominal: Soft. Bowel sounds are normal. He exhibits no distension. There is no tenderness.  Musculoskeletal: He exhibits no edema.  No diabetic foot lesions.   Lymphadenopathy:    He has no cervical adenopathy.  Neurological: No sensory deficit.          Assessment & Plan:

## 2013-05-11 NOTE — Assessment & Plan Note (Signed)
LDL was 67 on 04/19/13. Not on medications now. Will observe without starting med.

## 2013-05-11 NOTE — Progress Notes (Signed)
Patient ID: [name] [name], male   DOB: [birth year], 43 y.o.   MRN: [id number] Subjective:   Patient ID: [name] [name] male   DOB: 04-02-1970 43 y.o.   MRN: [id number]  CC:   Follow up visit.  HPI:  [name] is a 43 y.o. man  with past medical history as outlined below, who presents for a followup visit today.   Patient was seen in clinic on 04/19/13. He comes back for followup of his DM-II and HTN.   1. DM-II: A1c >14 on 10/01/11-->9.0 on 04/19/13. Patient is currently taking Lantus 40 units daily at bedtime. He was supposed to take metformin 1000 mg twice a day, but he is only taking it once a day. He does not have symptoms for hyper- or hypoglycemia today.   2. HIV: He used to follow up with Dr. Johnnye [name]. He did not follow up with ID since 08/2011. He states that he stopped taking his HIV medications (Atripla and Kaletra) since 10/2011 because that the HIV medication mess up his stomach. HIs CD4 was 140 and VL was 85328 on 04/19/13. He just visited Dr. Algis [name] office this AM. Per Dr. Algis [name] note, he is to start Bactrim prophylaxis and will check his HLA and the genotype. Today he does not have fever, chills, sore throat, difficult in swallowing or swollen glands.   3.  HLD:  LDL was 10/01/11 when he was on Lipitor 10 mg daily. He stopped taking Lipitor since 10/2011. Repeated LDL was 67 on 04/19/13, he dose not need to resume Lipitor at this moment.  4. HTN: Patient reports that he is taking amlodipine 5 mg daily. Today blood pressure is 161/100 mmHg. He does not have chest pain, shortness of breath, or leg edema.   ROS:  Denies fever, chills, headaches, cough, chest pain, SOB,  abdominal pain, diarrhea, constipation, dysuria, urgency, frequency, hematuria, joint pain or leg swelling. Has blurry vision bilaterally. No eye pain or discharge.   Past Medical History  Diagnosis Date  . Diabetes mellitus     type II 2006  . Hepatitis B infection     hx of hepatitis infection  . HIV  infection   . Hyperlipidemia   . Hypertension   . Syphilis    Current Outpatient Prescriptions  Medication Sig Dispense Refill  . B-D UF III MINI PEN NEEDLES 31G X 5 MM MISC USE TO INJECT LANTUS ONCE A DAY  100 each  11  . glucose blood (ONE TOUCH ULTRA TEST) test strip Use to check blood sugar up to 3 times daily Dx code: 250.00   100 each  12  . Insulin Glargine (LANTUS SOLOSTAR) 100 UNIT/ML Solostar Pen INJECT 42 UNITS SUB-Q AT BEDTIME      . Insulin Pen Needle (B-D UF III MINI PEN NEEDLES) 31G X 5 MM MISC Use to inject lantus insulin once daily  100 each  5  . ONETOUCH DELICA LANCETS MISC Use to check blood sugar up to 3 times daily Dx code: 250.00  100 each  12  . sulfamethoxazole-trimethoprim (BACTRIM DS) 800-160 MG per tablet Take 1 tablet by mouth 3 (three) times a week.  30 tablet  11  . lisinopril (PRINIVIL,ZESTRIL) 10 MG tablet Take 1 tablet (10 mg total) by mouth daily.  30 tablet  3  . metFORMIN (GLUCOPHAGE) 1000 MG tablet Take 1 tablet (1,000 mg total) by mouth 2 (two) times daily.  62 tablet  11   No current facility-administered medications for this  visit.   Family History  Problem Relation Age of Onset  . Kidney disease Mother   . Diabetes Mother   . Heart disease Mother   . Kidney disease Father   . Diabetes Maternal Aunt   . Diabetes Maternal Uncle   . Coronary artery disease Maternal Grandfather   . Diabetes Maternal Grandmother    History   Social History  . Marital Status: Single    Spouse Name: N/A    Number of Children: N/A  . Years of Education: N/A   Social History Main Topics  . Smoking status: Never Smoker   . Smokeless tobacco: None  . Alcohol Use: No  . Drug Use: No  . Sexual Activity: No     Comment: pt. declined condoms   Other Topics Concern  . None   Social History Narrative   Single    Review of Systems: Full 14-point review of systems otherwise negative. See HPI.   Objective:  Physical Exam: Filed Vitals:   05/11/13 0956  05/11/13 1043 05/11/13 1135  BP: 161/100 158/82 164/96  Pulse: 79    Temp: 98 F (36.7 C)    TempSrc: Oral    Height: 6' (1.829 m)    Weight: 192 lb 1.6 oz (87.136 kg)    SpO2: 100%     Constitutional: Vital signs reviewed.  Patient is a well-developed and well-nourished, in no acute distress and cooperative with exam.   HEENT:  Head: Normocephalic and atraumatic Mouth: no erythema or exudates, MMM Eyes: PERRL, EOMI, conjunctivae normal, No scleral icterus.  Neck: Supple, Trachea midline normal ROM, No JVD  Cardiovascular: RRR, S1 normal, S2 normal, no MRG, pulses symmetric and intact bilaterally Pulmonary/Chest: CTAB, no wheezes, rales, or rhonchi Abdominal: Soft. Non-tender, non-distended, bowel sounds are normal, no masses, organomegaly, or guarding present.  GU: no CVA tenderness Musculoskeletal: No joint deformities, erythema, or stiffness, ROM full and non-tender Extremities: No leg edema Hematology: no cervical, inginal, or axillary adenopathy.  Neurological: A&O x3, Strength is normal and symmetric bilaterally, cranial nerve II-XII are grossly intact, no focal motor deficit, sensory intact to light touch bilaterally.  Skin: Warm, dry and intact. No rash, cyanosis, or clubbing.  Psychiatric: Normal mood and affect. No suicidal or homicidal ideation.  Assessment & Plan:

## 2013-05-11 NOTE — Assessment & Plan Note (Signed)
CD4 was 140 and VL was 85328 on 04/19/13 which has been worse due to medication noncompliance. He just visited Dr. Algis Downs office this AM. Per Dr. Algis Downs note, he is to start Bactrim prophylaxis and will check his HLA and the genotype. Will follow up recommendations.

## 2013-05-11 NOTE — Assessment & Plan Note (Addendum)
Lab Results  Component Value Date   HGBA1C 9.0* 04/19/2013   HGBA1C >14.0 10/01/2011   HGBA1C 10.4 05/05/2011     Assessment: Diabetes control: poor control (HgbA1C >9%) Progress toward A1C goal:  improved Comments:   Plan: Medications:  will increas lanuts to 42 units daily and let patient take metformin 1000 mg twice a day. Home glucose monitoring: Frequency:   Timing:   Instruction/counseling given: reminded to bring blood glucose meter & log to each visit Educational resources provided: brochure Self management tools provided:   Other plans: patient is instructed to measure blood sugar level ideally 3 times a day, if it is too difficult, measure blood sugar level once a day, and 3 times a day for at least 3 days before next visit. Will reevaluate in one month.

## 2013-05-11 NOTE — Assessment & Plan Note (Signed)
Greatly appreciate IM partnering with [place]. Will have him seen there this AM.

## 2013-05-12 LAB — URINE CYTOLOGY ANCILLARY ONLY
Chlamydia: NEGATIVE
Neisseria Gonorrhea: NEGATIVE

## 2013-05-12 LAB — T.PALLIDUM AB, TOTAL: T pallidum Antibodies (TP-PA): >8 S/CO — ABNORMAL HIGH (ref <0.90)

## 2013-05-13 LAB — HIV-1 RNA ULTRAQUANT REFLEX TO GENTYP+
HIV 1 RNA QUANT: 121006 {copies}/mL — AB (ref <20)
HIV-1 RNA Quant, Log: 5.08 {Log} — ABNORMAL HIGH (ref <1.30)

## 2013-05-17 ENCOUNTER — Encounter (INDEPENDENT_AMBULATORY_CARE_PROVIDER_SITE_OTHER): Payer: BC Managed Care – PPO | Admitting: Ophthalmology

## 2013-05-17 DIAGNOSIS — E11319 Type 2 diabetes mellitus with unspecified diabetic retinopathy without macular edema: Secondary | ICD-10-CM

## 2013-05-17 DIAGNOSIS — H43819 Vitreous degeneration, unspecified eye: Secondary | ICD-10-CM

## 2013-05-17 DIAGNOSIS — E1165 Type 2 diabetes mellitus with hyperglycemia: Secondary | ICD-10-CM

## 2013-05-17 DIAGNOSIS — E1139 Type 2 diabetes mellitus with other diabetic ophthalmic complication: Secondary | ICD-10-CM

## 2013-05-17 DIAGNOSIS — H35039 Hypertensive retinopathy, unspecified eye: Secondary | ICD-10-CM

## 2013-05-17 DIAGNOSIS — I10 Essential (primary) hypertension: Secondary | ICD-10-CM

## 2013-05-17 DIAGNOSIS — E11311 Type 2 diabetes mellitus with unspecified diabetic retinopathy with macular edema: Secondary | ICD-10-CM

## 2013-05-17 NOTE — Progress Notes (Signed)
INTERNAL MEDICINE TEACHING ATTENDING ADDENDUM - Jayron Maqueda, DO: I reviewed and discussed at the time of visit with the resident Dr. Niu, the patient's medical history, physical examination, diagnosis and results of tests and treatment and I agree with the patient's care as documented. 

## 2013-05-18 ENCOUNTER — Telehealth: Payer: Self-pay | Admitting: *Deleted

## 2013-05-18 ENCOUNTER — Other Ambulatory Visit: Payer: Self-pay | Admitting: Infectious Diseases

## 2013-05-18 MED ORDER — PENICILLIN G BENZATHINE 2400000 UNIT/4ML IM SUSP: 2.4000 10*6.[IU] | Freq: Once | INTRAMUSCULAR | Status: DC

## 2013-05-18 NOTE — Telephone Encounter (Signed)
Called and left patient to call the clinic for a sooner appointment for detectable virus. Appointments available 06/08/13. Per Dr. [name] he also needs Bicillin x 1 for positive RPR. He will need a nurse visit ASAP. Myrtis Hopping

## 2013-05-18 NOTE — Progress Notes (Signed)
Pt with primary syphilis. needs appt and needs IM Pen G 2.4 million units x1.

## 2013-05-18 NOTE — Telephone Encounter (Signed)
Message copied by Georgena Spurling on Wed May 18, 2013 11:22 AM ------      Message from: HATCHER, JEFFREY C      Created: Wed May 18, 2013  9:20 AM       Pt needs appt sooner ------

## 2013-05-19 NOTE — Telephone Encounter (Signed)
Patient notified and he will come for nurse visit on 05/23/13 and MD appt 06/08/13. Myrtis Hopping

## 2013-05-21 LAB — HLA B*5701: HLA-B 5701 W/RFLX HLA-B HIGH: NEGATIVE

## 2013-05-23 ENCOUNTER — Ambulatory Visit (INDEPENDENT_AMBULATORY_CARE_PROVIDER_SITE_OTHER): Payer: BC Managed Care – PPO | Admitting: *Deleted

## 2013-05-23 DIAGNOSIS — A539 Syphilis, unspecified: Secondary | ICD-10-CM

## 2013-05-23 MED ORDER — PENICILLIN G BENZATHINE 1200000 UNIT/2ML IM SUSP
1.2000 10*6.[IU] | Freq: Once | INTRAMUSCULAR | Status: AC
  Administered 2013-05-23: 1.2 10*6.[IU] via INTRAMUSCULAR

## 2013-05-23 NOTE — Patient Instructions (Signed)
Pt will return next Monday for 2nd and final injections.  Appointment made.

## 2013-05-23 NOTE — Progress Notes (Signed)
Spoke with Dr. [name].  Order for Bicillin LA 2.4 Million units today and repeat in one week.

## 2013-05-24 ENCOUNTER — Telehealth: Payer: Self-pay | Admitting: *Deleted

## 2013-05-24 LAB — HIV-1 GENOTYPR PLUS

## 2013-05-24 NOTE — Telephone Encounter (Signed)
Patient has appt with Dr. [name] on 06/08/13. Myrtis Hopping

## 2013-05-24 NOTE — Telephone Encounter (Signed)
Message copied by Georgena Spurling on Tue May 24, 2013  2:48 PM ------      Message from: Orland Mustard K      Created: Mon May 23, 2013  4:40 PM                   ----- Message -----         From: Campbell Riches, MD         Sent: 05/18/2013   9:20 AM           To: Orland Mustard, RN, Myrtis Hopping, CMA, #            Pt needs appt sooner ------

## 2013-06-01 ENCOUNTER — Ambulatory Visit (INDEPENDENT_AMBULATORY_CARE_PROVIDER_SITE_OTHER): Payer: BC Managed Care – PPO | Admitting: *Deleted

## 2013-06-01 DIAGNOSIS — A539 Syphilis, unspecified: Secondary | ICD-10-CM

## 2013-06-01 MED ORDER — PENICILLIN G BENZATHINE 1200000 UNIT/2ML IM SUSP
1.2000 10*6.[IU] | Freq: Once | INTRAMUSCULAR | Status: AC
  Administered 2013-06-01: 1.2 10*6.[IU] via INTRAMUSCULAR

## 2013-06-02 ENCOUNTER — Ambulatory Visit: Payer: BC Managed Care – PPO

## 2013-06-02 ENCOUNTER — Encounter: Payer: BC Managed Care – PPO | Admitting: Dietician

## 2013-06-07 ENCOUNTER — Encounter: Payer: Self-pay | Admitting: Internal Medicine

## 2013-06-07 ENCOUNTER — Encounter: Payer: BC Managed Care – PPO | Admitting: Internal Medicine

## 2013-06-07 NOTE — Addendum Note (Signed)
Addended by: Hulan Fray on: 06/07/2013 06:17 PM   Modules accepted: Orders

## 2013-06-08 ENCOUNTER — Encounter: Payer: Self-pay | Admitting: Infectious Diseases

## 2013-06-08 ENCOUNTER — Ambulatory Visit (INDEPENDENT_AMBULATORY_CARE_PROVIDER_SITE_OTHER): Payer: BC Managed Care – PPO | Admitting: Dietician

## 2013-06-08 ENCOUNTER — Ambulatory Visit (INDEPENDENT_AMBULATORY_CARE_PROVIDER_SITE_OTHER): Payer: BC Managed Care – PPO | Admitting: Infectious Diseases

## 2013-06-08 ENCOUNTER — Ambulatory Visit: Payer: BC Managed Care – PPO | Admitting: Internal Medicine

## 2013-06-08 ENCOUNTER — Encounter: Payer: Self-pay | Admitting: Dietician

## 2013-06-08 VITALS — BP 154/104 | HR 80

## 2013-06-08 VITALS — BP 173/119 | HR 84 | Temp 98.3°F | Ht 72.0 in | Wt 188.0 lb

## 2013-06-08 DIAGNOSIS — E119 Type 2 diabetes mellitus without complications: Secondary | ICD-10-CM

## 2013-06-08 DIAGNOSIS — I10 Essential (primary) hypertension: Secondary | ICD-10-CM

## 2013-06-08 DIAGNOSIS — B20 Human immunodeficiency virus [HIV] disease: Secondary | ICD-10-CM

## 2013-06-08 DIAGNOSIS — A539 Syphilis, unspecified: Secondary | ICD-10-CM | POA: Insufficient documentation

## 2013-06-08 DIAGNOSIS — E785 Hyperlipidemia, unspecified: Secondary | ICD-10-CM

## 2013-06-08 MED ORDER — ABACAVIR-DOLUTEGRAVIR-LAMIVUD 600-50-300 MG PO TABS: 1.0000 | ORAL_TABLET | Freq: Every day | ORAL | Status: DC

## 2013-06-08 NOTE — Progress Notes (Signed)
   Subjective:    Patient ID: [name] [name], male    DOB: 12-04-1970, 43 y.o.   MRN: [id number]  HPI 43 yo M with hx of DM2, HTN and HIV+. He was previously on atripla (and KLT?). He quit taking these in 2013 due to N/V.  He was seen 04-2013 and found to have HgBA1C 9%, RPR 1:512. He was treated for syphilllis.  Today he feels well. His BP is markedly elevated. Vision has improved (due to DM and HTN).    Review of Systems  Constitutional: Negative for appetite change and unexpected weight change.  Respiratory: Negative for shortness of breath.   Cardiovascular: Negative for chest pain.  Gastrointestinal: Negative for diarrhea and constipation.  Genitourinary: Negative for difficulty urinating.  Neurological: Negative for numbness.       Objective:   Physical Exam  Constitutional: He appears well-developed and well-nourished.  HENT:  Mouth/Throat: No oropharyngeal exudate.  Eyes: EOM are normal. Pupils are equal, round, and reactive to light.  Neck: Neck supple.  Cardiovascular: Normal rate, regular rhythm and normal heart sounds.   Pulmonary/Chest: Effort normal and breath sounds normal.  Abdominal: Soft. Bowel sounds are normal. There is no tenderness. There is no rebound.  Lymphadenopathy:    He has no cervical adenopathy.          Assessment & Plan:

## 2013-06-08 NOTE — Assessment & Plan Note (Signed)
He is now on insulin, metformin. Appreciate IM f/u.

## 2013-06-08 NOTE — Assessment & Plan Note (Signed)
Will get him into IM today. His BP is markedly elevated.

## 2013-06-08 NOTE — Assessment & Plan Note (Signed)
Will start him on triumeq. Will see him back in 6 weeks to recheck his CD4 and RNA. Offered/refused condoms. Needs PNVX #2 later this year.

## 2013-06-08 NOTE — Progress Notes (Signed)
Diabetes Self-Management Education  Visit Number: Other (Comment)  06/08/2013 [name], identified by name and date of birth, is a 43 y.o. male with Type 2 diabetes  .    ASSESSMENT  Patient Concerns: High blood pressure- came from ID clinic today due to elevated blood pressure, has doctor appointment at 1:15 PM    Blood pressure 154/104, pulse 80. Weight is in ID visit.   Lab Results: LDL Cholesterol  Date Value Ref Range Status  04/19/2013 67  0 - 99 mg/dL Final           Hemoglobin A1C  Date Value Ref Range Status  04/19/2013 9.0* <5.7 % Final                                                                                   10/01/2011 >14.0   Final     Family History  Problem Relation Age of Onset  . Kidney disease Mother   . Diabetes Mother   . Heart disease Mother   . Kidney disease Father   . Diabetes Maternal Aunt   . Diabetes Maternal Uncle   . Coronary artery disease Maternal Grandfather   . Diabetes Maternal Grandmother    History  Substance Use Topics  . Smoking status: Never Smoker   . Smokeless tobacco: Never Used  . Alcohol Use: No    Support Systems:   unsure at this time Special Needs:    none noted Prior DM Education:  yes- twice here Daily Foot Exams:  yes Patient Belief / Attitude about Diabetes:   has a good attitude about caring for his diabetes, seems stressed today  Assessment comments: patient reports good knowledge of diabetes self care. Bought new meter last week but has not started using it yet. Checks once a day fasting. He was vague about carb content of foods, but has been cutting down on portions, eating more fruit and vegetables, no soda and less fried foods which he reports has been working with increased medicine. CBG 3 hours after breakfast today was 164 and he reports they were 160-170 last week when he last testing them. Needs rx for accu check smartview and fastclix lancets for 1x/day testing sent to [facility]. 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 Suggested he consider tracking steps for a week or so on his smartphone as well as consider calming activities such  as stretching, yoga or meditation.    Diet Recall: 2 deviled eggs and gatorade, often skips lunch, but when he eats it is grilled chicken sandwich, says he often has grilled chicken for dinner as well and vegetables. Weight is approaching/at goal for his height with BMI slightly more than 25. Encouraged DASH Diet.    Individualized Plan for Diabetes Self-Management Training:  Meter, review, address concern with blood pressure  Education Topics Reviewed with Patient Today:  Topic Points Discussed  Disease State    Nutrition Management    Physical Activity and Exercise    Medications    Monitoring Taught/evaluated SMBG with accu check Nano meter.Purpose and frequency of SMBG.;Identified appropriate SMBG and A1C goals.  Acute Complications  Chronic Complications    Psychosocial Adjustment    Goal Setting    Preconception Care (if applicable)      PATIENTS GOALS   Learning Objective(s):     Goal The patient agrees to:  Nutrition    Physical Activity    Medications    Monitoring test my blood glucose as discussed (1x per day);  Problem Solving    Reducing Risk    Health Coping     Patient Self-Evaluation of Goals (Subsequent Visits)  Goal The patient rates self as meeting goals (% of time)  Nutrition    Physical Activity    Medications    Monitoring    Problem Solving    Reducing Risk    Health Coping       PERSONALIZED PLAN / SUPPORT  Self-Management Support:  Doctor's office;CDE visits ______________________________________________________________________  Outcomes  Expected Outcomes:  Demonstrated interest in learning. Expect positive outcomes Self-Care Barriers:  Unable to determine Education material provided: yes on dash diet and meter If problems or  questions, patient to contact team via:  Phone and Email Time in: 1030     Time out: 1100 Future DSME appointment: - Yearly   [name] [name] 06/08/2013 12:37 PM

## 2013-06-08 NOTE — Assessment & Plan Note (Signed)
Lab Results  Component Value Date   CHOL 120 04/19/2013   HDL 35* 04/19/2013   LDLCALC 67 04/19/2013   TRIG 89 04/19/2013   CHOLHDL 3.4 04/19/2013    Doing well.

## 2013-06-08 NOTE — Assessment & Plan Note (Signed)
Will recheck his RPR at f/u.

## 2013-06-15 ENCOUNTER — Ambulatory Visit: Payer: BC Managed Care – PPO | Admitting: Internal Medicine

## 2013-07-14 ENCOUNTER — Other Ambulatory Visit: Payer: BC Managed Care – PPO

## 2013-07-27 ENCOUNTER — Ambulatory Visit: Payer: BC Managed Care – PPO | Admitting: Infectious Diseases

## 2013-08-17 ENCOUNTER — Encounter (INDEPENDENT_AMBULATORY_CARE_PROVIDER_SITE_OTHER): Payer: BC Managed Care – PPO | Admitting: Ophthalmology

## 2013-08-22 ENCOUNTER — Telehealth: Payer: Self-pay | Admitting: *Deleted

## 2013-08-22 ENCOUNTER — Ambulatory Visit: Payer: BC Managed Care – PPO | Admitting: Infectious Diseases

## 2013-08-22 NOTE — Telephone Encounter (Signed)
Called patient and left message for patient to call the clinic to reschedule his lab and MD appt. He no showed both of these. Myrtis Hopping

## 2013-10-18 ENCOUNTER — Other Ambulatory Visit: Payer: BC Managed Care – PPO

## 2013-10-26 ENCOUNTER — Other Ambulatory Visit: Payer: Self-pay | Admitting: Internal Medicine

## 2013-10-27 NOTE — Telephone Encounter (Signed)
Patient needs to have an appointment in next month since was never followed up after starting Lisinopril.

## 2013-11-01 ENCOUNTER — Encounter: Payer: Self-pay | Admitting: Internal Medicine

## 2013-11-02 ENCOUNTER — Other Ambulatory Visit: Payer: BC Managed Care – PPO

## 2013-11-02 DIAGNOSIS — B20 Human immunodeficiency virus [HIV] disease: Secondary | ICD-10-CM

## 2013-11-02 DIAGNOSIS — A539 Syphilis, unspecified: Secondary | ICD-10-CM

## 2013-11-02 LAB — COMPREHENSIVE METABOLIC PANEL
ALT: 203 U/L — AB (ref 0–53)
AST: 155 U/L — ABNORMAL HIGH (ref 0–37)
Albumin: 3.5 g/dL (ref 3.5–5.2)
Alkaline Phosphatase: 82 U/L (ref 39–117)
BUN: 12 mg/dL (ref 6–23)
CALCIUM: 8.8 mg/dL (ref 8.4–10.5)
CHLORIDE: 102 meq/L (ref 96–112)
CO2: 25 mEq/L (ref 19–32)
Creat: 1.09 mg/dL (ref 0.50–1.35)
Glucose, Bld: 155 mg/dL — ABNORMAL HIGH (ref 70–99)
Potassium: 4.4 mEq/L (ref 3.5–5.3)
Sodium: 138 mEq/L (ref 135–145)
TOTAL PROTEIN: 8.6 g/dL — AB (ref 6.0–8.3)
Total Bilirubin: 0.3 mg/dL (ref 0.2–1.2)

## 2013-11-02 LAB — CBC
HCT: 38.8 % — ABNORMAL LOW (ref 39.0–52.0)
HEMOGLOBIN: 12.8 g/dL — AB (ref 13.0–17.0)
MCH: 25.9 pg — ABNORMAL LOW (ref 26.0–34.0)
MCHC: 33 g/dL (ref 30.0–36.0)
MCV: 78.5 fL (ref 78.0–100.0)
Platelets: 208 10*3/uL (ref 150–400)
RBC: 4.94 MIL/uL (ref 4.22–5.81)
RDW: 15.1 % (ref 11.5–15.5)
WBC: 5.7 10*3/uL (ref 4.0–10.5)

## 2013-11-02 LAB — RPR TITER: RPR Titer: 1:32 {titer} — AB

## 2013-11-02 LAB — RPR: RPR Ser Ql: REACTIVE — AB

## 2013-11-03 LAB — T-HELPER CELL (CD4) - (RCID CLINIC ONLY)
CD4 T CELL ABS: 270 /uL — AB (ref 400–2700)
CD4 T CELL HELPER: 16 % — AB (ref 33–55)

## 2013-11-03 LAB — FLUORESCENT TREPONEMAL AB(FTA)-IGG-BLD: Fluorescent Treponemal ABS: REACTIVE — AB

## 2013-11-04 LAB — HIV-1 RNA QUANT-NO REFLEX-BLD
HIV 1 RNA Quant: <20 copies/mL (ref <20)
HIV-1 RNA Quant, Log: <1.3 {Log} (ref <1.30)

## 2013-11-14 ENCOUNTER — Ambulatory Visit (INDEPENDENT_AMBULATORY_CARE_PROVIDER_SITE_OTHER): Payer: BC Managed Care – PPO | Admitting: Infectious Diseases

## 2013-11-14 ENCOUNTER — Encounter: Payer: Self-pay | Admitting: Infectious Diseases

## 2013-11-14 VITALS — BP 149/103 | HR 90 | Temp 98.9°F | Ht 72.0 in | Wt 196.0 lb

## 2013-11-14 DIAGNOSIS — B20 Human immunodeficiency virus [HIV] disease: Secondary | ICD-10-CM | POA: Diagnosis not present

## 2013-11-14 DIAGNOSIS — Z23 Encounter for immunization: Secondary | ICD-10-CM

## 2013-11-14 DIAGNOSIS — I10 Essential (primary) hypertension: Secondary | ICD-10-CM

## 2013-11-14 DIAGNOSIS — Z113 Encounter for screening for infections with a predominantly sexual mode of transmission: Secondary | ICD-10-CM | POA: Diagnosis not present

## 2013-11-14 DIAGNOSIS — Z79899 Other long term (current) drug therapy: Secondary | ICD-10-CM

## 2013-11-14 DIAGNOSIS — Z111 Encounter for screening for respiratory tuberculosis: Secondary | ICD-10-CM | POA: Diagnosis not present

## 2013-11-14 DIAGNOSIS — E119 Type 2 diabetes mellitus without complications: Secondary | ICD-10-CM

## 2013-11-14 DIAGNOSIS — E785 Hyperlipidemia, unspecified: Secondary | ICD-10-CM | POA: Diagnosis not present

## 2013-11-14 NOTE — Assessment & Plan Note (Signed)
Will have him f/u with IM. Greatly appreciate their partnering with [place].

## 2013-11-14 NOTE — Assessment & Plan Note (Addendum)
Will update his Tet and PNVX. check his serologies for MMR.  He is given condoms, flu shot. Will continue his current ART, stop his bactrim,  he is doing well.  Will see him back in 6 months.

## 2013-11-14 NOTE — Addendum Note (Signed)
Addended by: Landis Gandy on: 11/14/2013 02:06 PM   Modules accepted: Orders

## 2013-11-14 NOTE — Progress Notes (Signed)
   Subjective:    Patient ID: [name] [name], male    DOB: [birth year], 43 y.o.   MRN: [id number]  HPI 43 yo M with hx of DM2, HTN, syphilis, and HIV+. He was previously on atripla (and KLT?). He quit taking these in 2013 due to N/V.  He was previously started on triumeq.  Has been feeling well, busy with work and school. Needs work forms filled out.   HIV 1 RNA Quant (copies/mL)  Date Value  11/02/2013 <20   05/11/2013 121006*  04/19/2013 56861*     CD4 T Cell Abs (/uL)  Date Value  11/02/2013 270*  04/19/2013 140*  10/01/2011 800    Had vision this year (4 mos ago).  5 days ago had single episode of n/v, diarrhea x1. attributes to his diet.   Review of Systems  Constitutional: Negative for appetite change and unexpected weight change.  Eyes: Negative for visual disturbance.  Gastrointestinal: Negative for diarrhea and constipation.  Genitourinary: Negative for difficulty urinating.  Neurological: Negative for numbness.       Objective:   Physical Exam  Constitutional: He appears well-developed and well-nourished.  HENT:  Mouth/Throat: No oropharyngeal exudate.  Eyes: EOM are normal. Pupils are equal, round, and reactive to light.  Neck: Neck supple.  Cardiovascular: Normal rate, regular rhythm and normal heart sounds.   Pulmonary/Chest: Effort normal and breath sounds normal.  Abdominal: Soft. Bowel sounds are normal.  Musculoskeletal: He exhibits no edema.  Lymphadenopathy:    He has no cervical adenopathy.          Assessment & Plan:

## 2013-11-14 NOTE — Assessment & Plan Note (Signed)
encourage him to get back in with IM clinic.

## 2013-11-14 NOTE — Assessment & Plan Note (Signed)
Greatly appreciate IM seeing him.

## 2013-11-15 LAB — MEASLES/MUMPS/RUBELLA IMMUNITY
Mumps IgG: <5 AU/mL (ref <9.00)
RUBELLA: 1.05 {index} — AB (ref <0.90)
RUBEOLA IGG: 65.7 [AU]/ml — AB (ref <25.00)

## 2013-11-16 LAB — QUANTIFERON TB GOLD ASSAY (BLOOD)
INTERFERON GAMMA RELEASE ASSAY: NEGATIVE
Mitogen value: 7.92 IU/mL
Quantiferon Nil Value: 0.28 IU/mL
Quantiferon Tb Ag Minus Nil Value: 0.02 IU/mL
TB AG VALUE: 0.3 [IU]/mL

## 2013-11-16 LAB — TB SKIN TEST
INDURATION: 0 mm
TB SKIN TEST: NEGATIVE

## 2014-01-01 ENCOUNTER — Other Ambulatory Visit: Payer: Self-pay | Admitting: Internal Medicine

## 2014-01-02 NOTE — Telephone Encounter (Signed)
Message sent to front office to schedule pt an appt. 

## 2014-01-02 NOTE — Telephone Encounter (Signed)
Patient needs a f/u appt

## 2014-01-11 ENCOUNTER — Encounter: Payer: Self-pay | Admitting: Internal Medicine

## 2014-01-16 ENCOUNTER — Encounter (HOSPITAL_COMMUNITY): Payer: Self-pay | Admitting: *Deleted

## 2014-01-16 ENCOUNTER — Emergency Department (HOSPITAL_COMMUNITY)
Admission: EM | Admit: 2014-01-16 | Discharge: 2014-01-16 | Disposition: A | Discharge status: Home / Self Care | Payer: BC Managed Care – PPO | Attending: Emergency Medicine | Admitting: Emergency Medicine

## 2014-01-16 ENCOUNTER — Emergency Department (HOSPITAL_COMMUNITY): Payer: BC Managed Care – PPO

## 2014-01-16 DIAGNOSIS — E119 Type 2 diabetes mellitus without complications: Secondary | ICD-10-CM | POA: Insufficient documentation

## 2014-01-16 DIAGNOSIS — R51 Headache: Secondary | ICD-10-CM | POA: Insufficient documentation

## 2014-01-16 DIAGNOSIS — Z79899 Other long term (current) drug therapy: Secondary | ICD-10-CM | POA: Insufficient documentation

## 2014-01-16 DIAGNOSIS — Z21 Asymptomatic human immunodeficiency virus [HIV] infection status: Secondary | ICD-10-CM | POA: Diagnosis not present

## 2014-01-16 DIAGNOSIS — Z794 Long term (current) use of insulin: Secondary | ICD-10-CM | POA: Diagnosis not present

## 2014-01-16 DIAGNOSIS — Z8619 Personal history of other infectious and parasitic diseases: Secondary | ICD-10-CM | POA: Diagnosis not present

## 2014-01-16 DIAGNOSIS — I10 Essential (primary) hypertension: Secondary | ICD-10-CM | POA: Diagnosis not present

## 2014-01-16 DIAGNOSIS — R062 Wheezing: Secondary | ICD-10-CM

## 2014-01-16 DIAGNOSIS — J069 Acute upper respiratory infection, unspecified: Secondary | ICD-10-CM | POA: Insufficient documentation

## 2014-01-16 DIAGNOSIS — R05 Cough: Secondary | ICD-10-CM | POA: Diagnosis present

## 2014-01-16 DIAGNOSIS — R030 Elevated blood-pressure reading, without diagnosis of hypertension: Secondary | ICD-10-CM

## 2014-01-16 MED ORDER — HYDROCOD POLST-CHLORPHEN POLST 10-8 MG/5ML PO LQCR
5.0000 mL | Freq: Every evening | ORAL | Status: DC | PRN
Start: 2014-01-16 — End: 2014-03-14

## 2014-01-16 NOTE — Discharge Instructions (Signed)
Call for a follow up appointment with a Family or Primary Care Provider, for further evaluation of your upper respiratory symptoms and elevated blood pressure reading. Return if Symptoms worsen.   Over the counter cough medications as needed. Take medication as prescribed.   Drink plenty of fluids. Saltwater gargles 3-4 times a day as needed. Tussionex contains narcotics, do not operate heavy machinery or drink alcohol while taking narcotics.

## 2014-01-16 NOTE — ED Notes (Signed)
Pt states that he has had cough, congestion, headache, nasal congestion, and sore throat.

## 2014-01-16 NOTE — ED Provider Notes (Signed)
CSN: [id number]     Arrival date & time 01/16/14  1518 History  This chart was scribed for a non-physician practitioner, Harvie Heck, PA-C working with Artis Delay, MD by Martinique Peace, ED Scribe. The patient was seen in TR05C/TR05C. The patient's care was started at 5:23 PM.      Chief Complaint  Patient presents with  . URI      HPI Comments: [name] [name] is a 43 y.o. male with a past medical history of HIV, who presents to the Emergency Department complaining of URI onset 3 days ago. Pt complains of associated cough, congestion, headache, nasal congestion, and sore throat.  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  He denies fever.  [name] is his ID physician. He reports compliance with anti-HIV medication.                                                                             11/02/2013 CD4 Cells: 270  CD 4 %: 16    Patient is a 43 y.o. male presenting with URI. The history is provided by the patient. No language interpreter was used.  URI Presenting symptoms: congestion, cough and sore throat   Presenting symptoms: no fever   Associated symptoms: headaches        Past Medical History  Diagnosis Date  . Diabetes mellitus     type II 2006  . Hepatitis B infection     hx of hepatitis infection  . HIV infection   . Hyperlipidemia   . Hypertension   . Syphilis    History reviewed. No pertinent past surgical history. Family History  Problem Relation Age of Onset  . Kidney disease Mother   . Diabetes Mother   . Heart disease Mother   . Kidney disease Father   . Diabetes Maternal Aunt   . Diabetes Maternal Uncle   . Coronary artery disease Maternal Grandfather   . Diabetes Maternal Grandmother    History  Substance Use Topics  . Smoking status: Never Smoker   . Smokeless tobacco: Never Used  . Alcohol Use: No    Review of Systems  Constitutional: Negative for fever.  HENT: Positive for  congestion, sinus pressure and sore throat.   Respiratory: Positive for cough.   Neurological: Positive for headaches.      Allergies  Review of patient's allergies indicates no known allergies.  Home Medications   Prior to Admission medications   Medication Sig Start Date End Date Taking? Authorizing Provider  Abacavir-Dolutegravir-Lamivud 195-09-326 MG TABS Take 1 tablet by mouth daily. 06/08/13  Yes Campbell Riches, MD  amLODipine (NORVASC) 5 MG tablet Take 5 mg by mouth daily.   Yes Historical Provider, MD  Insulin Glargine (LANTUS SOLOSTAR) 100 UNIT/ML Solostar Pen INJECT 42 UNITS SUB-Q AT BEDTIME 02/27/13  Yes Ivor Costa, MD  lisinopril (PRINIVIL,ZESTRIL) 10 MG tablet TAKE 1 TABLET (10 MG TOTAL) BY MOUTH DAILY. 01/02/14  Yes Albin Felling, MD  metFORMIN (GLUCOPHAGE) 1000 MG tablet Take 1 tablet (1,000 mg total) by mouth 2 (two) times daily. 05/11/13  Yes Campbell Riches, MD  B-D UF III MINI PEN NEEDLES  31G X 5 MM MISC USE TO INJECT LANTUS ONCE A DAY 01/02/13   Ivor Costa, MD  glucose blood (ONE TOUCH ULTRA TEST) test strip Use to check blood sugar up to 3 times daily Dx code: 250.00  09/06/10   Rosalia Hammers, MD  Insulin Pen Needle (B-D UF III MINI PEN NEEDLES) 31G X 5 MM MISC Use to inject lantus insulin once daily 09/06/10   Rosalia Hammers, MD  [facility] DELICA LANCETS MISC Use to check blood sugar up to 3 times daily Dx code: 250.00 09/06/10   Rosalia Hammers, MD   BP 177/111 mmHg  Pulse 94  Temp(Src) 98 F (36.7 C) (Oral)  Resp 16  Ht 6' (1.829 m)  Wt 188 lb (85.276 kg)  BMI 25.49 kg/m2  SpO2 98% Physical Exam  Constitutional: He is oriented to person, place, and time. He appears well-developed and well-nourished. No distress.  HENT:  Head: Normocephalic and atraumatic.  Nose: Rhinorrhea present. Right sinus exhibits maxillary sinus tenderness and frontal sinus tenderness. Left sinus exhibits maxillary sinus tenderness and frontal sinus tenderness.  Mouth/Throat: Uvula is  midline. No oropharyngeal exudate.  Bilateral cerumen impaction, Unable to visualize TM due to cerumen.  Eyes: Conjunctivae and EOM are normal.  Neck: Neck supple.  Cardiovascular: Normal rate and regular rhythm.   Pulmonary/Chest: Effort normal. He has wheezes. He has no rales. He exhibits no tenderness.  Mild expiratory wheezing, right lung fields greater than left. Patient able to speak in complete sentences.  Musculoskeletal: Normal range of motion.  Lymphadenopathy:       Head (right side): No tonsillar adenopathy present.       Head (left side): No tonsillar adenopathy present.       Right cervical: No superficial cervical adenopathy present.      Left cervical: No superficial cervical adenopathy present.  Neurological: He is alert and oriented to person, place, and time.  Skin: Skin is warm and dry.  Psychiatric: He has a normal mood and affect. His behavior is normal.  Nursing note and vitals reviewed.   ED Course  Procedures (including critical care time) Labs Review Labs Reviewed - No data to display   No results found.  Imaging Review Dg Chest 2 View  01/16/2014   CLINICAL DATA:  Patient complaining of coughing and wheezing. Headache and runny nose. Symptoms for 3 days. History of hypertension and diabetes.  EXAM: CHEST  2 VIEW  COMPARISON:  None.  FINDINGS: The heart size and mediastinal contours are within normal limits. Both lungs are clear. The visualized skeletal structures are unremarkable.  IMPRESSION: No active cardiopulmonary disease.   Electronically Signed   By: Lajean Manes M.D.   On: 01/16/2014 18:11     EKG Interpretation None     Medications - No data to display  5:27 PM- Treatment plan was discussed with patient who verbalizes understanding and agrees.   MDM   Final diagnoses:  Wheezing  URI, acute  Elevated blood pressure reading   Pt CXR negative for acute infiltrate, Patient is afebrile will not treat for pneumonia. Patients symptoms are  consistent with URI, likely viral etiology. Discussed that antibiotics are not indicated for viral infections. Pt will be discharged with symptomatic treatment.  Verbalizes understanding and is agreeable with plan. Pt is hemodynamically stable & in NAD prior to dc. Patient is to follow-up with infectious disease doctor as needed. Patient reports compliance with HIV medication.  Meds given in ED:  Medications - No data to display  Discharge Medication List as of 01/16/2014  6:36 PM    START taking these medications   Details  chlorpheniramine-HYDROcodone (TUSSIONEX PENNKINETIC ER) 10-8 MG/5ML LQCR Take 5 mLs by mouth at bedtime as needed for cough., Starting 01/16/2014, Until Discontinued, Print       I personally performed the services described in this documentation, which was scribed in my presence. The recorded information has been reviewed and is accurate.   Harvie Heck, PA-C 01/16/14 1909

## 2014-03-13 ENCOUNTER — Other Ambulatory Visit: Payer: Self-pay | Admitting: Internal Medicine

## 2014-03-14 ENCOUNTER — Ambulatory Visit (INDEPENDENT_AMBULATORY_CARE_PROVIDER_SITE_OTHER): Payer: BC Managed Care – PPO | Admitting: Internal Medicine

## 2014-03-14 ENCOUNTER — Encounter: Payer: Self-pay | Admitting: Internal Medicine

## 2014-03-14 VITALS — BP 174/105 | HR 94 | Temp 98.2°F | Ht 72.0 in | Wt 199.9 lb

## 2014-03-14 DIAGNOSIS — Z794 Long term (current) use of insulin: Secondary | ICD-10-CM

## 2014-03-14 DIAGNOSIS — I10 Essential (primary) hypertension: Secondary | ICD-10-CM | POA: Diagnosis not present

## 2014-03-14 DIAGNOSIS — E1165 Type 2 diabetes mellitus with hyperglycemia: Secondary | ICD-10-CM | POA: Diagnosis not present

## 2014-03-14 DIAGNOSIS — E119 Type 2 diabetes mellitus without complications: Secondary | ICD-10-CM

## 2014-03-14 LAB — POCT GLYCOSYLATED HEMOGLOBIN (HGB A1C): Hemoglobin A1C: 12.1

## 2014-03-14 LAB — GLUCOSE, CAPILLARY: GLUCOSE-CAPILLARY: 256 mg/dL — AB (ref 70–99)

## 2014-03-14 MED ORDER — GLUCOSE BLOOD VI STRP: ORAL_STRIP | Status: DC

## 2014-03-14 MED ORDER — AMLODIPINE BESYLATE 5 MG PO TABS: 5.0000 mg | ORAL_TABLET | Freq: Every day | ORAL | Status: DC

## 2014-03-14 MED ORDER — INSULIN GLARGINE 100 UNIT/ML SOLOSTAR PEN: 45.0000 [IU] | PEN_INJECTOR | Freq: Every day | SUBCUTANEOUS | Status: DC

## 2014-03-14 MED ORDER — INSULIN PEN NEEDLE 31G X 5 MM MISC: Status: DC

## 2014-03-14 NOTE — Patient Instructions (Signed)
General Instructions: -Start using Lantus 45 units at bedtime everyday.  -Start checking your blood sugar at least 3 times per day: before breakfast, before dinner, at bedtime.  -You have been referred to Endocrinology for your diabetes care. They will call you with the appointment.  -Follow up with Dr. Arcelia Jew in 2-3 months.    Happy New Year!   Thank you for bringing your medicines today. This helps [place] keep you safe from mistakes.   Progress Toward Treatment Goals:  Treatment Goal 05/11/2013  Hemoglobin A1C improved  Blood pressure deteriorated    Self Care Goals & Plans:  Self Care Goal 03/14/2014  Manage my medications take my medicines as prescribed; bring my medications to every visit; refill my medications on time  Monitor my health keep track of my blood glucose; bring my glucose meter and log to each visit  Eat healthy foods drink diet soda or water instead of juice or soda; eat more vegetables; eat foods that are low in salt; eat baked foods instead of fried foods; eat fruit for snacks and desserts    No flowsheet data found.   Care Management & Community Referrals:  No flowsheet data found.

## 2014-03-15 ENCOUNTER — Other Ambulatory Visit: Payer: Self-pay | Admitting: Internal Medicine

## 2014-03-15 NOTE — Assessment & Plan Note (Signed)
BP Readings from Last 3 Encounters:  03/14/14 174/105  01/16/14 173/108  11/14/13 149/103    Lab Results  Component Value Date   NA 138 11/02/2013   K 4.4 11/02/2013   CREATININE 1.09 11/02/2013    Assessment: Blood pressure control:  not controlled Progress toward BP goal:   not at goal Comments: He had not taken his medications prior to this visit as he had run out of them  Plan: Medications:  Continue lisinopril 10mg  daily and Norvasc 5 mg daily Educational resources provided: brochure (has information) Self management tools provided:   Other plans: Follow up in 2-3 months with his PCP, Dr. Arcelia Jew

## 2014-03-15 NOTE — Progress Notes (Signed)
   Subjective:    Patient ID: [name] [name], male    DOB: [birth year], 43 y.o.   MRN: [id number]  HPI [name] [name] is a 43 yr old man with PMH of HIV, DM2, HTN, presenting for follow up visit for his diabetes and hypertension. He is on Lantus and has been adjusting its dose from 30 units to 45 units depending on his food intake. He has been checking his blood sugar once per week.  He is about out of the Lantus. He had not followed up with the [facility] in >6 months do to his demanding work schedule in [employer]. He denies symptoms of hypoglycemia. He has had occasional burning and numbness of his bilateral feet.    Review of Systems  Constitutional: Negative for fever, chills, diaphoresis, appetite change and fatigue.  Eyes: Negative for visual disturbance.  Respiratory: Negative for cough and wheezing.   Cardiovascular: Negative for chest pain, palpitations and leg swelling.  Gastrointestinal: Negative for abdominal pain.  Endocrine: Negative for polydipsia and polyuria.  Genitourinary: Negative for dysuria.  Musculoskeletal: Negative for arthralgias.  Skin: Negative for rash.  Neurological: Negative for dizziness, weakness and light-headedness.  Psychiatric/Behavioral: Negative for agitation.       Objective:   Physical Exam  Constitutional: He is oriented to person, place, and time. He appears well-developed and well-nourished. No distress.  Cardiovascular: Normal rate.   Pulmonary/Chest: Effort normal. No respiratory distress.  Abdominal: Soft.  Musculoskeletal: He exhibits no edema or tenderness.  Neurological: He is alert and oriented to person, place, and time. Coordination normal.  Skin: Skin is warm and dry. No rash noted. He is not diaphoretic.  Psychiatric: He has a normal mood and affect.  Nursing note and vitals reviewed.         Assessment & Plan:

## 2014-03-15 NOTE — Assessment & Plan Note (Signed)
Lab Results  Component Value Date   HGBA1C 12.1 03/14/2014   HGBA1C 9.0* 04/19/2013   HGBA1C >14.0 10/01/2011     Assessment: Diabetes control:  no controlled Progress toward A1C goal:   Not at goal Comments: [name] [name] has been adjusting Lantus from 30 units to 45 units depending on the foods [name] [name] eats, without checking his blood sugar. [name] [name] brought his GBG meter with readings in 2014 but only one read in 2015 so it is unclear if his BS have been persistently elevated. [name] [name] denies s/s of hypoglycemia. [name] [name] has no blurry vision, occasional numbness of his soles.   Plan: Medications:  advised pt to inject Lantus 45 u every night and check his blood glucose at least 3 times daily, before bkfst, before dinner, and at bedtime. Advised him to also carry his meter with him to work and always have a plan for treatment low blood sugar.  Home glucose monitoring: Frequency:   Timing:   Instruction/counseling given: reminded to get eye exam, reminded to bring blood glucose meter & log to each visit and reminded to bring medications to each visit Educational resources provided: brochure (has information) Self management tools provided: copy of home glucose meter download, home glucose logbook Other plans: Referred him to Endocrinology per his request. Foot exam done during this visit.

## 2014-03-16 NOTE — Progress Notes (Signed)
Medicine attending: Medical history, presenting problems, physical findings, and medications, reviewed with Dr Kennerly on the day of the patient encounter and I concur with her evaluation and management plan. 

## 2014-03-29 ENCOUNTER — Ambulatory Visit: Payer: BC Managed Care – PPO | Admitting: Endocrinology

## 2014-05-02 NOTE — Addendum Note (Signed)
Addended by: Hulan Fray on: 05/02/2014 09:39 AM   Modules accepted: Orders

## 2014-05-05 ENCOUNTER — Other Ambulatory Visit: Payer: BC Managed Care – PPO

## 2014-05-05 DIAGNOSIS — B20 Human immunodeficiency virus [HIV] disease: Secondary | ICD-10-CM

## 2014-05-05 DIAGNOSIS — Z79899 Other long term (current) drug therapy: Secondary | ICD-10-CM

## 2014-05-05 DIAGNOSIS — Z113 Encounter for screening for infections with a predominantly sexual mode of transmission: Secondary | ICD-10-CM

## 2014-05-05 LAB — CBC
HEMATOCRIT: 40.6 % (ref 39.0–52.0)
Hemoglobin: 13.1 g/dL (ref 13.0–17.0)
MCH: 26.5 pg (ref 26.0–34.0)
MCHC: 32.3 g/dL (ref 30.0–36.0)
MCV: 82 fL (ref 78.0–100.0)
MPV: 9.5 fL (ref 8.6–12.4)
Platelets: 236 10*3/uL (ref 150–400)
RBC: 4.95 MIL/uL (ref 4.22–5.81)
RDW: 15.1 % (ref 11.5–15.5)
WBC: 5 10*3/uL (ref 4.0–10.5)

## 2014-05-05 LAB — T-HELPER CELL (CD4) - (RCID CLINIC ONLY)
CD4 T CELL ABS: 280 /uL — AB (ref 400–2700)
CD4 T CELL HELPER: 20 % — AB (ref 33–55)

## 2014-05-06 LAB — RPR

## 2014-05-06 LAB — COMPREHENSIVE METABOLIC PANEL
ALT: 94 U/L — AB (ref 0–53)
AST: 71 U/L — ABNORMAL HIGH (ref 0–37)
Albumin: 3.3 g/dL — ABNORMAL LOW (ref 3.5–5.2)
Alkaline Phosphatase: 99 U/L (ref 39–117)
BUN: 11 mg/dL (ref 6–23)
CO2: 28 mEq/L (ref 19–32)
Calcium: 9.3 mg/dL (ref 8.4–10.5)
Chloride: 103 mEq/L (ref 96–112)
Creat: 1.22 mg/dL (ref 0.50–1.35)
Glucose, Bld: 376 mg/dL — ABNORMAL HIGH (ref 70–99)
Potassium: 4.3 mEq/L (ref 3.5–5.3)
SODIUM: 137 meq/L (ref 135–145)
TOTAL PROTEIN: 7.7 g/dL (ref 6.0–8.3)
Total Bilirubin: 0.3 mg/dL (ref 0.2–1.2)

## 2014-05-06 LAB — LIPID PANEL
CHOL/HDL RATIO: 2.4 ratio
Cholesterol: 125 mg/dL (ref 0–200)
HDL: 52 mg/dL (ref >39)
LDL CALC: 60 mg/dL (ref 0–99)
Triglycerides: 65 mg/dL (ref <150)
VLDL: 13 mg/dL (ref 0–40)

## 2014-05-08 LAB — HIV-1 RNA QUANT-NO REFLEX-BLD
HIV 1 RNA QUANT: 29 {copies}/mL — AB (ref <20)
HIV-1 RNA QUANT, LOG: 1.46 {Log} — AB (ref <1.30)

## 2014-05-21 ENCOUNTER — Other Ambulatory Visit: Payer: Self-pay | Admitting: Internal Medicine

## 2014-05-21 DIAGNOSIS — E1165 Type 2 diabetes mellitus with hyperglycemia: Secondary | ICD-10-CM

## 2014-05-22 ENCOUNTER — Encounter: Payer: Self-pay | Admitting: Infectious Diseases

## 2014-05-22 ENCOUNTER — Ambulatory Visit (INDEPENDENT_AMBULATORY_CARE_PROVIDER_SITE_OTHER): Payer: BC Managed Care – PPO | Admitting: Infectious Diseases

## 2014-05-22 ENCOUNTER — Other Ambulatory Visit: Payer: Self-pay | Admitting: Pharmacist Clinician (PhC)/ Clinical Pharmacy Specialist

## 2014-05-22 VITALS — BP 185/112 | HR 85 | Temp 98.1°F | Ht 72.0 in | Wt 202.0 lb

## 2014-05-22 DIAGNOSIS — E785 Hyperlipidemia, unspecified: Secondary | ICD-10-CM | POA: Diagnosis not present

## 2014-05-22 DIAGNOSIS — B20 Human immunodeficiency virus [HIV] disease: Secondary | ICD-10-CM

## 2014-05-22 DIAGNOSIS — Z23 Encounter for immunization: Secondary | ICD-10-CM | POA: Diagnosis not present

## 2014-05-22 DIAGNOSIS — I10 Essential (primary) hypertension: Secondary | ICD-10-CM

## 2014-05-22 DIAGNOSIS — E1165 Type 2 diabetes mellitus with hyperglycemia: Secondary | ICD-10-CM | POA: Diagnosis not present

## 2014-05-22 MED ORDER — ABACAVIR-DOLUTEGRAVIR-LAMIVUD 600-50-300 MG PO TABS: 1.0000 | ORAL_TABLET | Freq: Every day | ORAL | Status: DC

## 2014-05-22 MED ORDER — METFORMIN HCL 1000 MG PO TABS: 1000.0000 mg | ORAL_TABLET | Freq: Every day | ORAL | Status: DC

## 2014-05-22 NOTE — Assessment & Plan Note (Signed)
He is doing well. Given condoms.  Gets MMR booster.  Will see him back in 6 months.

## 2014-05-22 NOTE — Assessment & Plan Note (Addendum)
Needs better control. Greatly appreciate IM f/u.  Needs ophtho

## 2014-05-22 NOTE — Assessment & Plan Note (Signed)
Improved

## 2014-05-22 NOTE — Progress Notes (Signed)
   Subjective:    Patient ID: [name] [name], male    DOB: June 15, 1970, 44 y.o.   MRN: [id number]  HPI 44 yo M with hx of DM2, HTN, syphilis, and HIV+. He was previously on atripla (and KLT?). He quit taking these in 2013 due to N/V.  He was previously started on triumeq.   He comes in for f/u. 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  Has had f/u at IM clinic- states his FSG have been up and down.  Has been taking his anti-htn rx. Denies sx from this.   HIV 1 RNA QUANT (copies/mL)  Date Value  05/05/2014 29*  11/02/2013 <20  05/11/2013 121006*   CD4 T CELL ABS (/uL)  Date Value  05/05/2014 280*  11/02/2013 270*  04/19/2013 140*   Has occas "sharp tingling every now and then" in his feet.   Review of Systems  Constitutional: Negative for appetite change and unexpected weight change.  Eyes: Positive for visual disturbance.  Respiratory: Negative for shortness of breath.   Cardiovascular: Negative for chest pain.  Gastrointestinal: Negative for diarrhea and constipation.  Genitourinary: Negative for difficulty urinating.  Neurological: Negative for dizziness, numbness and headaches.       Objective:   Physical Exam  Constitutional: He appears well-developed and well-nourished.  HENT:  Mouth/Throat: No oropharyngeal exudate.  Eyes: EOM are normal. Pupils are equal, round, and reactive to light.  Neck: Neck supple.  Cardiovascular: Normal rate, regular rhythm and normal heart sounds.   Pulmonary/Chest: Effort normal and breath sounds normal.  Abdominal: Soft. Bowel sounds are normal. He exhibits no distension. There is no tenderness.  Musculoskeletal: He exhibits no edema.  No diabetic foot lesions. Normal light touch BLE.  3+ DP pulses BLE.  Lymphadenopathy:    He has no cervical adenopathy.          Assessment & Plan:

## 2014-05-22 NOTE — Assessment & Plan Note (Signed)
Has not taken meds yet this AM. asx Will get him into IM, greatly appreciate their f/u

## 2014-05-22 NOTE — Progress Notes (Signed)
HPI: [name] [name] is a 44 y.o. male who is here for his HIV f/u.   Allergies: No Known Allergies  Vitals: Temp: 98.1 F (36.7 C) (03/07 0855) Temp Source: Oral (03/07 0855) BP: 185/112 mmHg (03/07 0855) Pulse Rate: 85 (03/07 0855)  Past Medical History: Past Medical History  Diagnosis Date  . Diabetes mellitus     type II 2006  . Hepatitis B infection     hx of hepatitis infection  . HIV infection   . Hyperlipidemia   . Hypertension   . Syphilis     Social History: History   Social History  . Marital Status: Single    Spouse Name: N/A  . Number of Children: N/A  . Years of Education: N/A   Social History Main Topics  . Smoking status: Never Smoker   . Smokeless tobacco: Never Used  . Alcohol Use: No  . Drug Use: No  . Sexual Activity: No     Comment: pt. declined condoms   Other Topics Concern  . Not on file   Social History Narrative   Single    Previous Regimen: ATP, KLT  Current Regimen: Triumeq  Labs: HIV 1 RNA QUANT (copies/mL)  Date Value  05/05/2014 29*  11/02/2013 <20  05/11/2013 121006*   CD4 T CELL ABS (/uL)  Date Value  05/05/2014 280*  11/02/2013 270*  04/19/2013 140*   HEP B S AB (no units)  Date Value  05/11/2006 NO   HEPATITIS B SURFACE AG (no units)  Date Value  05/11/2006 YES   HCV AB (no units)  Date Value  05/11/2006 NO    CrCl: Estimated Creatinine Clearance: 84.8 mL/min (by C-G formula based on Cr of 1.22).  Lipids:    Component Value Date/Time   CHOL 125 05/05/2014 0933   TRIG 65 05/05/2014 0933   HDL 52 05/05/2014 0933   CHOLHDL 2.4 05/05/2014 0933   VLDL 13 05/05/2014 0933   LDLCALC 60 05/05/2014 0933    Assessment: He is doing well on his ART. He is also on metformin with could be an issue with DTG in Triumeq. D/w Dr. [name] and pt about limiting metformin to 1000mg /day. After talking to him, that's what he has been doing anyway. Going to change is in the comp to reflect this.    Recommendations:  Change metformin to 1g PO qday  Wilfred Lacy, PharmD Clinical Infectious [place] [facility] 05/22/2014, 10:52 AM

## 2014-05-23 MED ORDER — METFORMIN HCL 1000 MG PO TABS: 1000.0000 mg | ORAL_TABLET | Freq: Two times a day (BID) | ORAL | Status: DC

## 2014-05-23 NOTE — Telephone Encounter (Signed)
Needs appt PCP ASAP. [place] if not able to get into PCP 90 days.

## 2014-05-23 NOTE — Telephone Encounter (Signed)
Reviewed Metformin Rx and verified should be 1000 BID

## 2014-06-22 ENCOUNTER — Ambulatory Visit (INDEPENDENT_AMBULATORY_CARE_PROVIDER_SITE_OTHER): Payer: BC Managed Care – PPO | Admitting: Endocrinology

## 2014-06-22 ENCOUNTER — Ambulatory Visit (INDEPENDENT_AMBULATORY_CARE_PROVIDER_SITE_OTHER): Payer: BC Managed Care – PPO | Admitting: Licensed Clinical Social Worker

## 2014-06-22 ENCOUNTER — Encounter: Payer: Self-pay | Admitting: Endocrinology

## 2014-06-22 VITALS — BP 134/86 | HR 89 | Temp 98.6°F | Ht 72.0 in | Wt 202.0 lb

## 2014-06-22 DIAGNOSIS — E1121 Type 2 diabetes mellitus with diabetic nephropathy: Secondary | ICD-10-CM

## 2014-06-22 DIAGNOSIS — Z23 Encounter for immunization: Secondary | ICD-10-CM | POA: Diagnosis not present

## 2014-06-22 DIAGNOSIS — Z794 Long term (current) use of insulin: Secondary | ICD-10-CM

## 2014-06-22 DIAGNOSIS — E119 Type 2 diabetes mellitus without complications: Secondary | ICD-10-CM

## 2014-06-22 DIAGNOSIS — E0829 Diabetes mellitus due to underlying condition with other diabetic kidney complication: Secondary | ICD-10-CM | POA: Insufficient documentation

## 2014-06-22 DIAGNOSIS — R809 Proteinuria, unspecified: Secondary | ICD-10-CM

## 2014-06-22 LAB — HEMOGLOBIN A1C: HEMOGLOBIN A1C: 13 % — AB (ref 4.6–6.5)

## 2014-06-22 MED ORDER — GLUCOSE BLOOD VI STRP: 1.0000 | ORAL_STRIP | Freq: Two times a day (BID) | Status: DC

## 2014-06-22 MED ORDER — INSULIN GLARGINE 100 UNIT/ML SOLOSTAR PEN: 70.0000 [IU] | PEN_INJECTOR | Freq: Every morning | SUBCUTANEOUS | Status: DC

## 2014-06-22 NOTE — Patient Instructions (Addendum)
good diet and exercise significantly improve the control of your diabetes.  please let me know if you wish to be referred to a dietician.  high blood sugar is very risky to your health.  you should see an eye doctor and dentist every year.  It is very important to get all recommended vaccinations.  controlling your blood pressure and cholesterol drastically reduces the damage diabetes does to your body.  Those who smoke should quit.  please discuss these with your doctor.  check your blood sugar twice a day.  vary the time of day when you check, between before the 3 meals, and at bedtime.  also check if you have symptoms of your blood sugar being too high or too low.  please keep a record of the readings and bring it to your next appointment here.  You can write it on any piece of paper.  please call us sooner if your blood sugar goes below 70, or if you have a lot of readings over 200. You can stop taking the metformin.  Here is a new meter.  i have sent a prescription to your pharmacy, for strips.  Please call next week, to tell us how the blood sugar is doing.   blood tests are requested for you today.  We'll let you know about the results. Please come back for a follow-up appointment in 3 months.

## 2014-06-22 NOTE — Progress Notes (Signed)
Subjective:    Patient ID: [name] [name], male    DOB: 24-Mar-1970, 44 y.o.   MRN: [id number]  HPI pt states DM was dx'ed in 2000; he has mild if any neuropathy of the lower extremities, but he has associated nephropathy; he has been on insulin since 2009; pt says his diet and exercise are good but not excellent; he has never had pancreatitis, severe hypoglycemia or DKA.  He says cbg meter is broken.  Past Medical History  Diagnosis Date  . Diabetes mellitus     type II 2006  . Hepatitis B infection     hx of hepatitis infection  . HIV infection   . Hyperlipidemia   . Hypertension   . Syphilis     No past surgical history on file.  History   Social History  . Marital Status: Single    Spouse Name: N/A  . Number of Children: N/A  . Years of Education: N/A   Occupational History  . Not on file.   Social History Main Topics  . Smoking status: Never Smoker   . Smokeless tobacco: Never Used  . Alcohol Use: No  . Drug Use: No  . Sexual Activity: No     Comment: pt. declined condoms   Other Topics Concern  . Not on file   Social History Narrative   Single    Current Outpatient Prescriptions on File Prior to Visit  Medication Sig Dispense Refill  . Abacavir-Dolutegravir-Lamivud 600-50-300 MG TABS Take 1 tablet by mouth daily. 90 tablet 3  . amLODipine (NORVASC) 5 MG tablet Take 1 tablet (5 mg total) by mouth daily. 30 tablet 2  . Insulin Pen Needle (B-D UF III MINI PEN NEEDLES) 31G X 5 MM MISC Use to inject Lantus once daily 100 each 11  . lisinopril (PRINIVIL,ZESTRIL) 10 MG tablet TAKE 1 TABLET BY MOUTH EVERY DAY 30 tablet 2  . ONETOUCH DELICA LANCETS MISC Use to check blood sugar up to 3 times daily Dx code: 250.00 100 each 12   No current facility-administered medications on file prior to visit.    No Known Allergies  Family History  Problem Relation Age of Onset  . Kidney disease Mother   . Diabetes Mother   . Heart disease Mother   . Kidney disease  Father   . Diabetes Maternal Aunt   . Diabetes Maternal Uncle   . Coronary artery disease Maternal Grandfather   . Diabetes Maternal Grandmother     BP 134/86 mmHg  Pulse 89  Temp(Src) 98.6 F (37 C) (Oral)  Ht 6' (1.829 m)  Wt 202 lb (91.627 kg)  BMI 27.39 kg/m2  SpO2 97%   Review of Systems denies weight loss, blurry vision, headache, chest pain, sob, n/v, urinary frequency, muscle cramps, excessive diaphoresis, depression, cold intolerance, rhinorrhea, and easy bruising.    Objective:   Physical Exam VS: see vs page GEN: no distress HEAD: head: no deformity eyes: no periorbital swelling, no proptosis external nose and ears are normal mouth: no lesion seen NECK: supple, thyroid is not enlarged CHEST WALL: no deformity LUNGS: clear to auscultation BREASTS:  No gynecomastia CV: reg rate and rhythm, no murmur ABD: abdomen is soft, nontender.  no hepatosplenomegaly.  not distended.  no hernia MUSCULOSKELETAL: muscle bulk and strength are grossly normal.  no obvious joint swelling.  gait is normal and steady EXTEMITIES: no deformity.  no ulcer on the feet.  feet are of normal color and temp.  no edema.  There is bilateral onychomycosis of the toenails. PULSES: dorsalis pedis intact bilat.  no carotid bruit NEURO:  cn 2-12 grossly intact.   readily moves all 4's.  sensation is intact to touch on the feet SKIN:  Normal texture and temperature.  No rash or suspicious lesion is visible.   NODES:  None palpable at the neck. PSYCH: alert, well-oriented.  Does not appear anxious nor depressed.  Lab Results  Component Value Date   HGBA1C 13.0* 06/22/2014      Assessment & Plan:  DM: severe exacerbation: as the urgent need is for some overall improvement in glycemic control, we'll continue the simple qd insulin regimen for now.  HTN: new to me: well-controlled.  continue the same medication Microalbuminuria: we'll recheck this in the future.     Patient is advised the  following: Patient Instructions  good diet and exercise significantly improve the control of your diabetes.  please let me know if you wish to be referred to a dietician.  high blood sugar is very risky to your health.  you should see an eye doctor and dentist every year.  It is very important to get all recommended vaccinations.  controlling your blood pressure and cholesterol drastically reduces the damage diabetes does to your body.  Those who smoke should quit.  please discuss these with your doctor.  check your blood sugar twice a day.  vary the time of day when you check, between before the 3 meals, and at bedtime.  also check if you have symptoms of your blood sugar being too high or too low.  please keep a record of the readings and bring it to your next appointment here.  You can write it on any piece of paper.  please call us sooner if your blood sugar goes below 70, or if you have a lot of readings over 200. You can stop taking the metformin.  Here is a new meter.  i have sent a prescription to your pharmacy, for strips.  Please call next week, to tell us how the blood sugar is doing.   blood tests are requested for you today.  We'll let you know about the results. Please come back for a follow-up appointment in 3 months.

## 2014-06-23 ENCOUNTER — Other Ambulatory Visit: Payer: Self-pay | Admitting: Internal Medicine

## 2014-06-23 ENCOUNTER — Encounter: Payer: Self-pay | Admitting: Internal Medicine

## 2014-09-21 ENCOUNTER — Ambulatory Visit: Payer: BC Managed Care – PPO | Admitting: Endocrinology

## 2014-09-21 ENCOUNTER — Telehealth: Payer: Self-pay | Admitting: Endocrinology

## 2014-09-21 NOTE — Telephone Encounter (Signed)
Follow up advised. Contact patient and schedule visit in 3 months. 

## 2014-09-21 NOTE — Telephone Encounter (Signed)
Left message for pt to return call about making an appt

## 2014-09-21 NOTE — Telephone Encounter (Signed)
Patient no showed today's appt. Please advise on how to follow up. °A. No follow up necessary. °B. Follow up urgent. Contact patient immediately. °C. Follow up necessary. Contact patient and schedule visit in ___ days. °D. Follow up advised. Contact patient and schedule visit in ____weeks. ° °

## 2014-09-23 ENCOUNTER — Other Ambulatory Visit: Payer: Self-pay | Admitting: Internal Medicine

## 2014-09-26 NOTE — Telephone Encounter (Signed)
Patient needs appointment. Has not been seen here since Feb 2015.

## 2014-10-02 ENCOUNTER — Other Ambulatory Visit: Payer: Self-pay | Admitting: Internal Medicine

## 2014-10-03 ENCOUNTER — Encounter: Payer: Self-pay | Admitting: Internal Medicine

## 2014-10-03 NOTE — Telephone Encounter (Signed)
Needs appointment. Has not been seen since Feb 2015.

## 2014-10-17 ENCOUNTER — Other Ambulatory Visit: Payer: Self-pay | Admitting: Endocrinology

## 2014-11-03 ENCOUNTER — Other Ambulatory Visit: Payer: Self-pay | Admitting: Internal Medicine

## 2014-11-05 ENCOUNTER — Other Ambulatory Visit: Payer: Self-pay | Admitting: Internal Medicine

## 2014-11-06 NOTE — Telephone Encounter (Signed)
Needs appointment

## 2014-11-06 NOTE — Telephone Encounter (Signed)
Message sent to front desk to scheduled appointment

## 2014-11-06 NOTE — Telephone Encounter (Signed)
Patient has not been seen in our clinic for almost 1.5 years now. Needs appointment.

## 2014-12-03 ENCOUNTER — Other Ambulatory Visit: Payer: Self-pay | Admitting: Internal Medicine

## 2014-12-05 ENCOUNTER — Encounter: Payer: Self-pay | Admitting: Internal Medicine

## 2014-12-05 ENCOUNTER — Other Ambulatory Visit: Payer: Self-pay | Admitting: Internal Medicine

## 2014-12-05 ENCOUNTER — Encounter: Payer: BC Managed Care – PPO | Admitting: Internal Medicine

## 2015-01-10 ENCOUNTER — Telehealth: Payer: Self-pay | Admitting: Dietician

## 2015-01-10 NOTE — Telephone Encounter (Signed)
Called patient and left call back Number for him to make an appointment.

## 2015-02-11 ENCOUNTER — Other Ambulatory Visit: Payer: Self-pay | Admitting: Internal Medicine

## 2015-02-16 ENCOUNTER — Other Ambulatory Visit: Payer: BC Managed Care – PPO

## 2015-02-16 ENCOUNTER — Ambulatory Visit (INDEPENDENT_AMBULATORY_CARE_PROVIDER_SITE_OTHER): Payer: BC Managed Care – PPO | Admitting: Internal Medicine

## 2015-02-16 VITALS — BP 204/114 | HR 85 | Temp 98.0°F | Wt 231.2 lb

## 2015-02-16 DIAGNOSIS — I10 Essential (primary) hypertension: Secondary | ICD-10-CM

## 2015-02-16 DIAGNOSIS — Z23 Encounter for immunization: Secondary | ICD-10-CM | POA: Diagnosis not present

## 2015-02-16 DIAGNOSIS — Z794 Long term (current) use of insulin: Secondary | ICD-10-CM | POA: Diagnosis not present

## 2015-02-16 DIAGNOSIS — E1169 Type 2 diabetes mellitus with other specified complication: Secondary | ICD-10-CM

## 2015-02-16 DIAGNOSIS — R809 Proteinuria, unspecified: Secondary | ICD-10-CM | POA: Diagnosis not present

## 2015-02-16 DIAGNOSIS — B20 Human immunodeficiency virus [HIV] disease: Secondary | ICD-10-CM

## 2015-02-16 DIAGNOSIS — L709 Acne, unspecified: Secondary | ICD-10-CM

## 2015-02-16 DIAGNOSIS — Z0000 Encounter for general adult medical examination without abnormal findings: Secondary | ICD-10-CM

## 2015-02-16 DIAGNOSIS — Z79899 Other long term (current) drug therapy: Secondary | ICD-10-CM

## 2015-02-16 DIAGNOSIS — E0829 Diabetes mellitus due to underlying condition with other diabetic kidney complication: Secondary | ICD-10-CM

## 2015-02-16 LAB — COMPREHENSIVE METABOLIC PANEL
ALBUMIN: 2.8 g/dL — AB (ref 3.6–5.1)
ALT: 18 U/L (ref 9–46)
AST: 22 U/L (ref 10–40)
Alkaline Phosphatase: 65 U/L (ref 40–115)
BUN: 14 mg/dL (ref 7–25)
CHLORIDE: 105 mmol/L (ref 98–110)
CO2: 27 mmol/L (ref 20–31)
CREATININE: 1.47 mg/dL — AB (ref 0.60–1.35)
Calcium: 8.3 mg/dL — ABNORMAL LOW (ref 8.6–10.3)
Glucose, Bld: 159 mg/dL — ABNORMAL HIGH (ref 65–99)
POTASSIUM: 3.9 mmol/L (ref 3.5–5.3)
SODIUM: 140 mmol/L (ref 135–146)
Total Bilirubin: 0.3 mg/dL (ref 0.2–1.2)
Total Protein: 7.2 g/dL (ref 6.1–8.1)

## 2015-02-16 LAB — T-HELPER CELL (CD4) - (RCID CLINIC ONLY)
CD4 T CELL HELPER: 21 % — AB (ref 33–55)
CD4 T Cell Abs: 430 /uL (ref 400–2700)

## 2015-02-16 LAB — CBC
HCT: 38 % — ABNORMAL LOW (ref 39.0–52.0)
HEMOGLOBIN: 12.8 g/dL — AB (ref 13.0–17.0)
MCH: 26.2 pg (ref 26.0–34.0)
MCHC: 33.7 g/dL (ref 30.0–36.0)
MCV: 77.7 fL — AB (ref 78.0–100.0)
MPV: 9.3 fL (ref 8.6–12.4)
PLATELETS: 267 10*3/uL (ref 150–400)
RBC: 4.89 MIL/uL (ref 4.22–5.81)
RDW: 14.4 % (ref 11.5–15.5)
WBC: 7.1 10*3/uL (ref 4.0–10.5)

## 2015-02-16 LAB — POCT GLYCOSYLATED HEMOGLOBIN (HGB A1C): Hemoglobin A1C: 11.4

## 2015-02-16 LAB — GLUCOSE, CAPILLARY: GLUCOSE-CAPILLARY: 390 mg/dL — AB (ref 65–99)

## 2015-02-16 MED ORDER — LISINOPRIL 20 MG PO TABS: 20.0000 mg | ORAL_TABLET | Freq: Every day | ORAL | Status: DC

## 2015-02-16 MED ORDER — TRETINOIN 0.05 % EX CREA: TOPICAL_CREAM | Freq: Every day | CUTANEOUS | Status: DC

## 2015-02-16 MED ORDER — BENZOYL PEROXIDE 2.5 % EX CREA: TOPICAL_CREAM | CUTANEOUS | Status: DC

## 2015-02-16 MED ORDER — HYDROCHLOROTHIAZIDE 12.5 MG PO TABS: 12.5000 mg | ORAL_TABLET | Freq: Every day | ORAL | Status: DC

## 2015-02-16 NOTE — Patient Instructions (Addendum)
-  Start taking HCTZ 12.5 mg daily for high blood pressure and lisinopril 20 mg daily  -Start checking your blood sugars 2-3 times a day and bring your log in next time, your A1c is 11.4 which is above goal of less than 7 -Will refer you to the eye doctor -Start applying benozyl peroxide in the morning and retinoid at night for your acne -Please come back on 12/21 for follow-up of your diabetes and high blood pressure  General Instructions:   Please bring your medicines with you each time you come to clinic.  Medicines may include prescription medications, over-the-counter medications, herbal remedies, eye drops, vitamins, or other pills.   Progress Toward Treatment Goals:  Treatment Goal 05/11/2013  Hemoglobin A1C improved  Blood pressure deteriorated    Self Care Goals & Plans:  Self Care Goal 03/14/2014  Manage my medications take my medicines as prescribed; bring my medications to every visit; refill my medications on time  Monitor my health keep track of my blood glucose; bring my glucose meter and log to each visit  Eat healthy foods drink diet soda or water instead of juice or soda; eat more vegetables; eat foods that are low in salt; eat baked foods instead of fried foods; eat fruit for snacks and desserts    No flowsheet data found.   Care Management & Community Referrals:  No flowsheet data found.

## 2015-02-16 NOTE — Progress Notes (Signed)
Patient ID: [name] [name], male   DOB: [birth year], 44 y.o.   MRN: [id number]    Subjective:   Patient ID: [name] [name] male   DOB: 27-May-1970 44 y.o.   MRN: [id number]  HPI: [name] [name] is a 44 y.o. pleasant man with past medical history of insulin-dependent Type 2 DM, hypertension, hyperlipidemia, HIV infection, and history of syphilis and hepatitis B infection who presents for medication refill of blood pressure medications.   He reports running out of lisinopril and amlodipine about 1 week ago and needs refills. He reports headache and blurry vision but denies chest pain, LE edema, or lightheadedness.   His last A1c was 13 on 06/22/14. He reports compliance with taking Lantus 70 U daily but does not check his glucose at home. He denies symptomatic hypoglycemia. He has chronic polydipsia, polyuria, and polyphagia but denies neuropathy or foot injury. He exercises regularly but does not follow a healthy diet. He has gained 31 lb since last visit one year ago. He has not had recent eye exam. He was seeing Dr. Alyssa Grove with endocrinology but has not followed up with him since April. He used to be on metformin but this was discontinued at that time.   He reports having history of acne with papules on his face and neck which he has been picking at. He is currently not using any topical agents. He reports taking oral antibiotic in the past that cleared it up.   He would like to have flu shot today.   Past Medical History  Diagnosis Date  . Diabetes mellitus     type II 2006  . Hepatitis B infection     hx of hepatitis infection  . HIV infection   . Hyperlipidemia   . Hypertension   . Syphilis    Current Outpatient Prescriptions  Medication Sig Dispense Refill  . Abacavir-Dolutegravir-Lamivud 600-50-300 MG TABS Take 1 tablet by mouth daily. 90 tablet 3  . amLODipine (NORVASC) 5 MG tablet TAKE 1 TABLET (5 MG TOTAL) BY MOUTH DAILY. 30 tablet 0  . doxycycline (DORYX) 100 MG DR  capsule Take 100 mg by mouth 2 (two) times daily.    Marland Kitchen glucose blood test strip 1 each by Other route 2 (two) times daily. One touch verio iq.  And lancets 2/day 100 each 12  . Insulin Pen Needle (B-D UF III MINI PEN NEEDLES) 31G X 5 MM MISC Use to inject Lantus once daily 100 each 11  . LANTUS SOLOSTAR 100 UNIT/ML Solostar Pen INJECT 70 UNITS INTO THE SKIN EVERY MORNING. 30 mL 2  . lisinopril (PRINIVIL,ZESTRIL) 10 MG tablet TAKE 1 TABLET BY MOUTH EVERY DAY 30 tablet 0  . lisinopril (PRINIVIL,ZESTRIL) 10 MG tablet TAKE 1 TABLET BY MOUTH EVERY DAY 30 tablet 0  . ONETOUCH DELICA LANCETS MISC Use to check blood sugar up to 3 times daily Dx code: 250.00 100 each 12   No current facility-administered medications for this visit.   Family History  Problem Relation Age of Onset  . Kidney disease Mother   . Diabetes Mother   . Heart disease Mother   . Kidney disease Father   . Diabetes Maternal Aunt   . Diabetes Maternal Uncle   . Coronary artery disease Maternal Grandfather   . Diabetes Maternal Grandmother    Social History   Social History  . Marital Status: Single    Spouse Name: N/A  . Number of Children: N/A  . Years of Education: N/A  Social History Main Topics  . Smoking status: Never Smoker   . Smokeless tobacco: Never Used  . Alcohol Use: No  . Drug Use: No  . Sexual Activity: No     Comment: pt. declined condoms   Other Topics Concern  . Not on file   Social History Narrative   Single   Review of Systems: Review of Systems  Constitutional: Negative for fever.       Weight gain  Eyes: Positive for blurred vision (chronic).  Respiratory: Negative for cough, shortness of breath and wheezing.   Cardiovascular: Negative for chest pain and leg swelling.  Gastrointestinal: Negative for nausea, vomiting, abdominal pain, diarrhea and constipation.  Genitourinary: Negative for dysuria, urgency and frequency.  Musculoskeletal: Negative for myalgias.  Skin: Positive for  rash (On face and neck).  Neurological: Positive for headaches. Negative for dizziness and sensory change (No peripheral neuropathy).  Endo/Heme/Allergies: Positive for polydipsia (chronic).    Objective:  Physical Exam: Filed Vitals:   02/16/15 1341 02/16/15 1431  BP: 210/112 204/114  Pulse: 87 85  Temp: 98 F (36.7 C)   TempSrc: Oral   Weight: 231 lb 3.2 oz (104.872 kg)   SpO2: 100%     Physical Exam  Constitutional: He is oriented to person, place, and time. He appears well-developed and well-nourished.  HENT:  Head: Normocephalic and atraumatic.  Right Ear: External ear normal.  Left Ear: External ear normal.  Nose: Nose normal.  Mouth/Throat: Oropharynx is clear and moist. No oropharyngeal exudate.  No oral thrush  Eyes: Conjunctivae and EOM are normal. Pupils are equal, round, and reactive to light. Right eye exhibits no discharge. Left eye exhibits no discharge. No scleral icterus.  Neck: Normal range of motion. Neck supple.  Cardiovascular: Normal rate, regular rhythm and normal heart sounds.   No murmur heard. Pulmonary/Chest: Effort normal and breath sounds normal. No respiratory distress. He has no wheezes. He has no rales.  Abdominal: Soft. Bowel sounds are normal. He exhibits no distension. There is no tenderness. There is no rebound and no guarding.  Musculoskeletal: Normal range of motion. He exhibits no edema or tenderness.  Neurological: He is alert and oriented to person, place, and time.  Skin: Skin is warm and dry. Rash (papules on left cheeck and right side of neck ) noted. He is not diaphoretic. No erythema. No pallor.  Psychiatric: He has a normal mood and affect. His behavior is normal. Judgment and thought content normal.    Assessment & Plan:   Please see problem list for problem-based assessment and plan

## 2015-02-17 DIAGNOSIS — L709 Acne, unspecified: Secondary | ICD-10-CM | POA: Insufficient documentation

## 2015-02-17 LAB — MICROALBUMIN / CREATININE URINE RATIO
CREATININE, UR: 103.2 mg/dL
MICROALB/CREAT RATIO: 1597.7 mg/g creat — ABNORMAL HIGH (ref 0.0–30.0)
Microalbumin, Urine: 1648.8 ug/mL

## 2015-02-17 MED ORDER — CLINDAMYCIN PHOS-BENZOYL PEROX 1.2-5 % EX GEL: CUTANEOUS | Status: DC

## 2015-02-17 NOTE — Assessment & Plan Note (Addendum)
Assessment: Pt with mild to moderate acne not currently on therapy with history of adult acne requiring oral antibiotic therapy.   Plan:  -Prescribe clindamycin-benzoyl peroxide1-2.5% gel to apply topically in the morning -Prescribe retina-A 0.05% cream to apply topically at bedtime on entire face not spot treatment -Consider doxycyline course if no improvement with topical antibiotic therapy

## 2015-02-17 NOTE — Assessment & Plan Note (Signed)
Assessment:  Pt with last A1c 13 on 06/22/14 compliant with insulin therapy with no recent symptomatic hypoglycemia who presents with CBG of 390 and improved A1c of 11.4.   Plan:  -A1c 11.4 not at goal <7, pt instructed to start checking blood glucose 2-3 times a day and return in 2 weeks with glucose meter for insulin adjustment. For now continue Lantus 70 U daily, most likely will need meal time insulin coverage. Consider starting metformin at next visit, unclear why it was discontinued by his endocrinologist in April of 2016.   -BP 210/112 not at goal <140/90 after running out of medications, start HCTZ 12.5 mg daily and increase lisinopril from 10 mg daily to 20 mg daily  -LDL 60 at goal <100 but due to ASCVD risk >7.5% pt in statin benefit group, consider starting at next visit  -Obtain annual micro albumin ---> 1.6 g of proteinuria, increase lisinopril from 10 mg daily to 20 mg daily  -Last annual eye exam on 04/19/13 with ischemic retinopathy, refer to opthalmology  -Perform annual foot exam today

## 2015-02-17 NOTE — Assessment & Plan Note (Signed)
Assessment: Pt with moderately well-controlled hypertension compliant with two-class (CCB & ACEi) anti-hypertensive therapy who presents with blood pressure of 210/112.   Plan:  -BP 210/112 not at goal <140/90 after running out of both medications for past week  -Prescribe HCTZ 12.5 mg daily, consider increasing at next visit if continues to be uncontrolled -Increase lisinopril from 10 mg daily to 20 mg daily in setting of worsening proteinuria  -Discontinue amlodipine 5 mg daily due to change in therapy to HCTZ, consider restarting if continues to be uncontrolled  -CMP obtained today at [facility] clinic

## 2015-02-17 NOTE — Assessment & Plan Note (Signed)
Pt received annual influenza vaccination today on 02/16/15

## 2015-02-20 LAB — HIV-1 RNA QUANT-NO REFLEX-BLD

## 2015-02-20 NOTE — Progress Notes (Signed)
Internal Medicine Clinic Attending  Case discussed with Dr. Rabbani soon after the resident saw the patient.  We reviewed the resident's history and exam and pertinent patient test results.  I agree with the assessment, diagnosis, and plan of care documented in the resident's note.  

## 2015-02-20 NOTE — Addendum Note (Signed)
Addended by: Lalla Brothers T on: 02/20/2015 10:40 AM   Modules accepted: Level of Service, SmartSet

## 2015-03-07 ENCOUNTER — Encounter: Payer: Self-pay | Admitting: Infectious Diseases

## 2015-03-07 ENCOUNTER — Ambulatory Visit (INDEPENDENT_AMBULATORY_CARE_PROVIDER_SITE_OTHER): Payer: BC Managed Care – PPO | Admitting: Internal Medicine

## 2015-03-07 ENCOUNTER — Encounter: Payer: Self-pay | Admitting: Internal Medicine

## 2015-03-07 ENCOUNTER — Ambulatory Visit (INDEPENDENT_AMBULATORY_CARE_PROVIDER_SITE_OTHER): Payer: BC Managed Care – PPO | Admitting: Infectious Diseases

## 2015-03-07 VITALS — BP 223/124 | HR 92 | Temp 98.1°F | Resp 18 | Ht 72.0 in | Wt 226.7 lb

## 2015-03-07 VITALS — BP 184/105 | HR 102 | Temp 98.3°F | Wt 227.0 lb

## 2015-03-07 DIAGNOSIS — E0829 Diabetes mellitus due to underlying condition with other diabetic kidney complication: Secondary | ICD-10-CM | POA: Diagnosis not present

## 2015-03-07 DIAGNOSIS — Z113 Encounter for screening for infections with a predominantly sexual mode of transmission: Secondary | ICD-10-CM

## 2015-03-07 DIAGNOSIS — Z7984 Long term (current) use of oral hypoglycemic drugs: Secondary | ICD-10-CM

## 2015-03-07 DIAGNOSIS — E1165 Type 2 diabetes mellitus with hyperglycemia: Secondary | ICD-10-CM | POA: Diagnosis not present

## 2015-03-07 DIAGNOSIS — I10 Essential (primary) hypertension: Secondary | ICD-10-CM

## 2015-03-07 DIAGNOSIS — Z79899 Other long term (current) drug therapy: Secondary | ICD-10-CM

## 2015-03-07 DIAGNOSIS — N179 Acute kidney failure, unspecified: Secondary | ICD-10-CM | POA: Diagnosis not present

## 2015-03-07 DIAGNOSIS — B20 Human immunodeficiency virus [HIV] disease: Secondary | ICD-10-CM | POA: Diagnosis not present

## 2015-03-07 DIAGNOSIS — E1169 Type 2 diabetes mellitus with other specified complication: Secondary | ICD-10-CM | POA: Diagnosis not present

## 2015-03-07 DIAGNOSIS — N185 Chronic kidney disease, stage 5: Secondary | ICD-10-CM | POA: Insufficient documentation

## 2015-03-07 DIAGNOSIS — R809 Proteinuria, unspecified: Secondary | ICD-10-CM

## 2015-03-07 DIAGNOSIS — Z794 Long term (current) use of insulin: Secondary | ICD-10-CM | POA: Diagnosis not present

## 2015-03-07 LAB — GLUCOSE, CAPILLARY: Glucose-Capillary: 130 mg/dL — ABNORMAL HIGH (ref 65–99)

## 2015-03-07 MED ORDER — AMLODIPINE BESYLATE 10 MG PO TABS: 10.0000 mg | ORAL_TABLET | Freq: Every day | ORAL | Status: DC

## 2015-03-07 MED ORDER — METFORMIN HCL 1000 MG PO TABS: 1000.0000 mg | ORAL_TABLET | Freq: Two times a day (BID) | ORAL | Status: DC

## 2015-03-07 MED ORDER — HYDROCHLOROTHIAZIDE 12.5 MG PO TABS: 25.0000 mg | ORAL_TABLET | Freq: Every day | ORAL | Status: DC

## 2015-03-07 MED ORDER — INSULIN GLARGINE 100 UNIT/ML SOLOSTAR PEN: 77.0000 [IU] | PEN_INJECTOR | Freq: Every day | SUBCUTANEOUS | Status: DC

## 2015-03-07 NOTE — Assessment & Plan Note (Addendum)
Lab Results  Component Value Date   HGBA1C 11.4 02/16/2015   HGBA1C 13.0* 06/22/2014   HGBA1C 12.1 03/14/2014     Assessment: Diabetes control:  uncontrolled Progress toward A1C goal:   progressing to goal Comments: pt is on metformin 1000mg  qd prescribed by Dr. Johnnye [name]. He is also on lantus 70 units qhs. 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 He did not bring his glucometer in but reports his lowest reading has been 131 with the highest reading around 270.   Plan: Medications:  Increased evening lantus to 77 units and increased metformin to 1000mg  BID.  Home glucose monitoring: Frequency:  TID  Timing:  w meals Instruction/counseling given: reminded to bring blood glucose meter & log to each visit Educational resources provided:   Self management tools provided:   Other plans: Instructed pt to try his best to check his BG TID x 2 weeks to get better glucose information if meal time insulin coverage is elected at the next visit. F/u in 2 weeks.

## 2015-03-07 NOTE — Assessment & Plan Note (Signed)
Uncontrolled, appreciate IM f/u.

## 2015-03-07 NOTE — Patient Instructions (Signed)
For your diabetes:  Start taking metformin 1000mg  twice a day.   Take lantus 77 units at night.   Check your blood sugars at least 3 times a day and bring it with you to your next visit.    For your high blood pressure:   Start taking:  1. Norvasc 10mg  daily 2. Hydrochlorothiazide 25mg  daily 3. Lisinopril 20mg  daily

## 2015-03-07 NOTE — Assessment & Plan Note (Signed)
He is doing very well.  vax are up to date.  Will cont his current rx.  Offered/refused condoms.  rtc in 6 months unless Cr rising and needs med change.

## 2015-03-07 NOTE — Progress Notes (Signed)
Subjective:   Patient ID: [name] [name] male   DOB: 08/04/1970 44 y.o.   MRN: [id number]  HPI: [name] is a 44 y.o. with past medical history as outlined below who presents to clinic for diabetes follow up.   Please see problem list for status of the pt's chronic medical problems.  Past Medical History  Diagnosis Date  . Diabetes mellitus     type II 2006  . Hepatitis B infection     hx of hepatitis infection  . HIV infection   . Hyperlipidemia   . Hypertension   . Syphilis    Current Outpatient Prescriptions  Medication Sig Dispense Refill  . Abacavir-Dolutegravir-Lamivud 600-50-300 MG TABS Take 1 tablet by mouth daily. 90 tablet 3  . Clindamycin-Benzoyl Per, Refr, gel Apply topically to face in morning 45 g 0  . glucose blood test strip 1 each by Other route 2 (two) times daily. One touch verio iq.  And lancets 2/day 100 each 12  . hydrochlorothiazide (HYDRODIURIL) 12.5 MG tablet Take 1 tablet (12.5 mg total) by mouth daily. 30 tablet 2  . Insulin Pen Needle (B-D UF III MINI PEN NEEDLES) 31G X 5 MM MISC Use to inject Lantus once daily 100 each 11  . LANTUS SOLOSTAR 100 UNIT/ML Solostar Pen INJECT 70 UNITS INTO THE SKIN EVERY MORNING. 30 mL 2  . lisinopril (PRINIVIL,ZESTRIL) 20 MG tablet Take 1 tablet (20 mg total) by mouth daily. Take 1 tablet daily 30 tablet 2  . ONETOUCH DELICA LANCETS MISC Use to check blood sugar up to 3 times daily Dx code: 250.00 100 each 12  . tretinoin (RETIN-A) 0.05 % cream Apply topically at bedtime. 45 g 0   No current facility-administered medications for this visit.   Family History  Problem Relation Age of Onset  . Kidney disease Mother   . Diabetes Mother   . Heart disease Mother   . Kidney disease Father   . Diabetes Maternal Aunt   . Diabetes Maternal Uncle   . Coronary artery disease Maternal Grandfather   . Diabetes Maternal Grandmother    Social History   Social History  . Marital Status: Single    Spouse  Name: N/A  . Number of Children: N/A  . Years of Education: N/A   Social History Main Topics  . Smoking status: Never Smoker   . Smokeless tobacco: Never Used  . Alcohol Use: No  . Drug Use: No  . Sexual Activity: No     Comment: pt. declined condoms   Other Topics Concern  . Not on file   Social History Narrative   Single   Review of Systems: Review of Systems  Eyes: Positive for pain (pressure behind eyes ~ 1 month). Negative for redness.  Respiratory: Negative for shortness of breath.   Cardiovascular: Negative for chest pain.  Gastrointestinal: Negative for nausea, vomiting and abdominal pain.  Neurological: Negative for headaches.  Endo/Heme/Allergies: Positive for polydipsia (and polyuria when his CBGs are in the 300's).    Objective:  Physical Exam: Filed Vitals:   03/07/15 0850  BP: 223/124  Pulse: 92  Temp: 98.1 F (36.7 C)  TempSrc: Oral  Resp: 18  Height: 6' (1.829 m)  Weight: 226 lb 11.2 oz (102.83 kg)  SpO2: 99%   Physical Exam  Constitutional: He appears well-developed and well-nourished.  HENT:  Head: Normocephalic.  Eyes: Conjunctivae are normal.  Cardiovascular: Normal rate, regular rhythm and normal heart sounds.   No murmur  heard. Pulmonary/Chest: Effort normal. No respiratory distress. He has no wheezes. He has no rales.  Abdominal: Soft. Bowel sounds are normal.  Neurological: He is alert.  Skin: Skin is warm and dry.   Assessment & Plan:   Please see problem based assessment and plan.

## 2015-03-07 NOTE — Assessment & Plan Note (Signed)
BP Readings from Last 3 Encounters:  03/07/15 184/105  03/07/15 223/124  02/16/15 204/114    Lab Results  Component Value Date   NA 140 02/16/2015   K 3.9 02/16/2015   CREATININE 1.47* 02/16/2015    Assessment: Blood pressure control: severely elevated Progress toward BP goal:   not at goal Comments: Pt was recently taken off of norvasc 5mg  and started on HCTZ 12.5mg  and his lisinopril was increased to 20mg . His BP are still elevated today and he has a throbbing sensation behind his eyes that is different from his previous headaches. Denies n/v.   Plan: Medications:  Increased HCTZ to 25mg , and restarted norvasc but at 10mg  dose. Continue lisinopril 20mg  and will increase to 40mg  if his BMET returns WNL and his BPs are still elevated.  Educational resources provided: brochure, handout, video Self management tools provided:   Other plans: f/u in 2 weeks for BP check

## 2015-03-07 NOTE — Assessment & Plan Note (Addendum)
Will recheck his Cr. If still up, will send to renal.  No sure if uncontrolled, Dm, HTN, medications or HIV is causing . His HIV rx is relatively renal sparing. He has mild LE edema.

## 2015-03-07 NOTE — Progress Notes (Signed)
   Subjective:    Patient ID: [name] [name], male    DOB: Apr 10, 1970, 44 y.o.   MRN: [id number]  HPI 44 yo M with hx of DM2, HTN, syphilis, and HIV+. He was previously on atripla (and KLT?). He quit taking these in 2013 due to N/V.  He was most recently started on triumeq.  He states his BP rx was changed today at IM.  He has also seen Endo since last visit.   HIV 1 RNA QUANT (copies/mL)  Date Value  02/16/2015 <20  05/05/2014 29*  11/02/2013 <20   CD4 T CELL ABS (/uL)  Date Value  02/16/2015 430  05/05/2014 280*  11/02/2013 270*    No problems with his ART. Can feel his head throbbing since his BP is up.  Eating lots of fast food.  His A1C has gone from 13 --> 11. He is to get more religious about checking his FSG.  Thinks he is getting a cold, started taking nyquil.    Review of Systems  Constitutional: Negative for appetite change and unexpected weight change.  Respiratory: Positive for cough and wheezing. Negative for shortness of breath.   Cardiovascular: Negative for chest pain.  Gastrointestinal: Negative for diarrhea and constipation.  Genitourinary: Negative for difficulty urinating.  Neurological: Negative for numbness and headaches.  Psychiatric/Behavioral: Negative for sleep disturbance.       Objective:   Physical Exam  Constitutional: He appears well-developed and well-nourished.  HENT:  Mouth/Throat: No oropharyngeal exudate.  Eyes: EOM are normal. Pupils are equal, round, and reactive to light.  Neck: Neck supple.  Cardiovascular: Normal rate, regular rhythm and normal heart sounds.   Pulmonary/Chest: Effort normal and breath sounds normal.  Abdominal: Soft. Bowel sounds are normal. There is no tenderness. There is no rebound.  Musculoskeletal: He exhibits edema.  No wounds on feet. Grossly nl light touch.  He has a small wound on his R shin which is well healing.   Lymphadenopathy:    He has no cervical adenopathy.       Assessment &  Plan:

## 2015-03-07 NOTE — Assessment & Plan Note (Signed)
He is working towards better control.  Greatly appreciate IM care for this pt.

## 2015-03-08 LAB — BASIC METABOLIC PANEL
BUN: 18 mg/dL (ref 7–25)
CHLORIDE: 102 mmol/L (ref 98–110)
CO2: 29 mmol/L (ref 20–31)
Calcium: 9 mg/dL (ref 8.6–10.3)
Creat: 1.6 mg/dL — ABNORMAL HIGH (ref 0.60–1.35)
Glucose, Bld: 258 mg/dL — ABNORMAL HIGH (ref 65–99)
POTASSIUM: 3.8 mmol/L (ref 3.5–5.3)
SODIUM: 138 mmol/L (ref 135–146)

## 2015-03-08 LAB — BMP8+ANION GAP
Anion Gap: 14 mmol/L (ref 10.0–18.0)
BUN/Creatinine Ratio: 11 (ref 9–20)
BUN: 15 mg/dL (ref 6–24)
CALCIUM: 9.2 mg/dL (ref 8.7–10.2)
CHLORIDE: 103 mmol/L (ref 96–106)
CO2: 24 mmol/L (ref 18–29)
Creatinine, Ser: 1.4 mg/dL — ABNORMAL HIGH (ref 0.76–1.27)
GFR calc Af Amer: 70 mL/min/{1.73_m2} (ref >59)
GFR calc non Af Amer: 60 mL/min/{1.73_m2} (ref >59)
GLUCOSE: 130 mg/dL — AB (ref 65–99)
Potassium: 3.9 mmol/L (ref 3.5–5.2)
Sodium: 141 mmol/L (ref 134–144)

## 2015-03-08 NOTE — Progress Notes (Signed)
Case discussed with Dr. Truong at the time of the visit.  We reviewed the resident's history and exam and pertinent patient test results.  I agree with the assessment, diagnosis and plan of care documented in the resident's note. 

## 2015-03-22 ENCOUNTER — Encounter (INDEPENDENT_AMBULATORY_CARE_PROVIDER_SITE_OTHER): Payer: BC Managed Care – PPO | Admitting: Ophthalmology

## 2015-03-23 ENCOUNTER — Ambulatory Visit (INDEPENDENT_AMBULATORY_CARE_PROVIDER_SITE_OTHER): Payer: BC Managed Care – PPO | Admitting: Pulmonary Disease

## 2015-03-23 ENCOUNTER — Encounter: Payer: Self-pay | Admitting: Pulmonary Disease

## 2015-03-23 VITALS — BP 163/96 | HR 100 | Temp 98.0°F | Ht 72.0 in | Wt 229.3 lb

## 2015-03-23 DIAGNOSIS — E0829 Diabetes mellitus due to underlying condition with other diabetic kidney complication: Secondary | ICD-10-CM

## 2015-03-23 DIAGNOSIS — E1169 Type 2 diabetes mellitus with other specified complication: Secondary | ICD-10-CM | POA: Diagnosis not present

## 2015-03-23 DIAGNOSIS — I10 Essential (primary) hypertension: Secondary | ICD-10-CM

## 2015-03-23 DIAGNOSIS — E1165 Type 2 diabetes mellitus with hyperglycemia: Secondary | ICD-10-CM | POA: Diagnosis not present

## 2015-03-23 DIAGNOSIS — Z794 Long term (current) use of insulin: Secondary | ICD-10-CM

## 2015-03-23 DIAGNOSIS — R809 Proteinuria, unspecified: Secondary | ICD-10-CM

## 2015-03-23 DIAGNOSIS — Z7984 Long term (current) use of oral hypoglycemic drugs: Secondary | ICD-10-CM

## 2015-03-23 MED ORDER — LIRAGLUTIDE 18 MG/3ML ~~LOC~~ SOPN: PEN_INJECTOR | SUBCUTANEOUS | Status: DC

## 2015-03-23 MED ORDER — LISINOPRIL-HYDROCHLOROTHIAZIDE 20-25 MG PO TABS: 1.0000 | ORAL_TABLET | Freq: Every day | ORAL | Status: DC

## 2015-03-23 MED ORDER — METOPROLOL SUCCINATE ER 25 MG PO TB24: 25.0000 mg | ORAL_TABLET | Freq: Every day | ORAL | Status: DC

## 2015-03-23 NOTE — Progress Notes (Signed)
   Subjective:    Patient ID: [name] [name], male    DOB: 11-Jun-1970, 45 y.o.   MRN: [id number]  HPI [name] is a 45 year old man with history of DM2, HIV, HLD, HTN presenting for follow up of HTN.  He reports he is doing well overall. He is compliant of his medications.   He is eating more salad packs and adding tomatoes to them. He is also eating less fast food. Active at work. Has to take stairs. Does not do exercise outside of that.  Review of Systems Constitutional: no fevers/chills Eyes: no vision changes Ears, nose, mouth, throat, and face: no cough Respiratory: no shortness of breath Cardiovascular: no chest pain Gastrointestinal: no nausea/vomiting, no abdominal pain, no constipation, no diarrhea Genitourinary: no dysuria Integument: no rash Hematologic/lymphatic: no edema  Past Medical History  Diagnosis Date  . Diabetes mellitus     type II 2006  . Hepatitis B infection     hx of hepatitis infection  . HIV infection ([place])   . Hyperlipidemia   . Hypertension   . Syphilis    Current Outpatient Prescriptions on File Prior to Visit  Medication Sig Dispense Refill  . Abacavir-Dolutegravir-Lamivud 600-50-300 MG TABS Take 1 tablet by mouth daily. 90 tablet 3  . amLODipine (NORVASC) 10 MG tablet Take 1 tablet (10 mg total) by mouth daily. 30 tablet 11  . Clindamycin-Benzoyl Per, Refr, gel Apply topically to face in morning 45 g 0  . glucose blood test strip 1 each by Other route 2 (two) times daily. One touch verio iq.  And lancets 2/day 100 each 12  . hydrochlorothiazide (HYDRODIURIL) 12.5 MG tablet Take 2 tablets (25 mg total) by mouth daily. 30 tablet 2  . Insulin Glargine (LANTUS SOLOSTAR) 100 UNIT/ML Solostar Pen Inject 77 Units into the skin daily. 30 mL 2  . Insulin Pen Needle (B-D UF III MINI PEN NEEDLES) 31G X 5 MM MISC Use to inject Lantus once daily 100 each 11  . lisinopril (PRINIVIL,ZESTRIL) 20 MG tablet Take 1 tablet (20 mg total) by mouth  daily. Take 1 tablet daily 30 tablet 2  . metFORMIN (GLUCOPHAGE) 1000 MG tablet Take 1 tablet (1,000 mg total) by mouth 2 (two) times daily with a meal. 60 tablet 11  . ONETOUCH DELICA LANCETS MISC Use to check blood sugar up to 3 times daily Dx code: 250.00 100 each 12  . tretinoin (RETIN-A) 0.05 % cream Apply topically at bedtime. 45 g 0   No current facility-administered medications on file prior to visit.    Today's Vitals   03/23/15 1602  BP: 163/96  Pulse: 100  Temp: 98 F (36.7 C)  TempSrc: Oral  Height: 6' (1.829 m)  Weight: 229 lb 4.8 oz (104.01 kg)  SpO2: 100%    Objective:   Physical Exam  Constitutional: He is oriented to person, place, and time. He appears well-developed and well-nourished.  HENT:  Head: Normocephalic and atraumatic.  Mouth/Throat: Oropharynx is clear and moist.  Eyes: Conjunctivae are normal.  Neck: Neck supple.  Cardiovascular: Normal rate and regular rhythm.   Pulmonary/Chest: Effort normal and breath sounds normal.  Musculoskeletal: Normal range of motion. He exhibits no edema.  Neurological: He is alert and oriented to person, place, and time. Coordination normal.  Skin: Skin is warm and dry.  Psychiatric: He has a normal mood and affect.   Assessment & Plan:  Please refer to problem based charting.

## 2015-03-23 NOTE — Assessment & Plan Note (Signed)
Lab Results  Component Value Date   HGBA1C 11.4 02/16/2015   HGBA1C 13.0* 06/22/2014   HGBA1C 12.1 03/14/2014     Assessment: Diabetes control: poor control (HgbA1C >9%) Progress toward A1C goal:  improved Comments: Meter downloaded - 106 before breakfast on one occasion. 141-224 before dinner. QHS readings 81-269.  Plan: Medications:  Continue metformin 1000 mg twice a day, Lantus 77 units daily. Start Victoza 0.6 mg daily for 1 week, then increase to 1.2 mg daily. Self management tools provided: copy of home glucose meter download Other plans:  -Instructed patient to increase frequency of blood glucose measurements. If he starts experiencing lower blood glucoses he is to call clinic so that we can titrate his Lantus. -Follow-up in 2-3 weeks

## 2015-03-23 NOTE — Assessment & Plan Note (Signed)
BP Readings from Last 3 Encounters:  03/23/15 163/96  03/07/15 184/105  03/07/15 223/124    Lab Results  Component Value Date   NA 138 03/07/2015   K 3.8 03/07/2015   CREATININE 1.60* 03/07/2015    Assessment: Blood pressure control: moderately elevated Progress toward BP goal:  improved  Plan: Medications: Changed lisinopril and hydrochlorothiazide to combination pill lisinopril-hydrochlorothiazide 20-25 mg daily. Continue amlodipine 10 mg daily. Start metoprolol succinate 25 mg daily. Educational resources provided: brochure, handout, video Other plans:  -Follow up in 2-3 weeks.

## 2015-03-23 NOTE — Patient Instructions (Signed)
When you run out of your lisinopril and hydrochlorothiazide, you can stop taking them and take the combination pill that has both lisinopril and hydrochlorothiazide in it (1 tablet daily).  For the Victoza, start at 0.6 mg once daily for 1 week; then increase to 1.2 mg once daily. If see that your blood sugars start getting low, please call the clinic so we can adjust your Lantus.

## 2015-03-26 NOTE — Addendum Note (Signed)
Addended by: Gilles Chiquito B on: 03/26/2015 11:01 AM   Modules accepted: Level of Service

## 2015-03-26 NOTE — Progress Notes (Signed)
Internal Medicine Clinic Attending  Case discussed with Dr. Krall at the time of the visit.  We reviewed the resident's history and exam and pertinent patient test results.  I agree with the assessment, diagnosis, and plan of care documented in the resident's note.  

## 2015-03-28 ENCOUNTER — Ambulatory Visit: Payer: BC Managed Care – PPO | Admitting: Infectious Diseases

## 2015-04-21 ENCOUNTER — Ambulatory Visit (INDEPENDENT_AMBULATORY_CARE_PROVIDER_SITE_OTHER): Payer: BC Managed Care – PPO | Admitting: Physician Assistant

## 2015-04-21 VITALS — BP 154/88 | HR 92 | Temp 98.0°F | Resp 18 | Ht 72.0 in | Wt 223.2 lb

## 2015-04-21 DIAGNOSIS — IMO0001 Reserved for inherently not codable concepts without codable children: Secondary | ICD-10-CM

## 2015-04-21 DIAGNOSIS — Z202 Contact with and (suspected) exposure to infections with a predominantly sexual mode of transmission: Secondary | ICD-10-CM | POA: Diagnosis not present

## 2015-04-21 DIAGNOSIS — R030 Elevated blood-pressure reading, without diagnosis of hypertension: Secondary | ICD-10-CM

## 2015-04-21 DIAGNOSIS — Z113 Encounter for screening for infections with a predominantly sexual mode of transmission: Secondary | ICD-10-CM | POA: Diagnosis not present

## 2015-04-21 MED ORDER — PENICILLIN G BENZATHINE 1200000 UNIT/2ML IM SUSP
2.4000 10*6.[IU] | Freq: Once | INTRAMUSCULAR | Status: AC
  Administered 2015-04-21: 2.4 10*6.[IU] via INTRAMUSCULAR

## 2015-04-21 NOTE — Progress Notes (Signed)
[facility] [address] [place] [address] [phone]  Date:  04/21/2015   Name:  [name] [name]   DOB:  February 02, 1971   MRN:  [id number]  PCP:  Albin Felling, MD    Chief Complaint: STD check   History of Present Illness:  This is a 45 y.o. male with PMH HIV, IDDM, hx syphilis, HLD, HTN who is presenting stating he was exposed to someone with syphilis. States he found a male partner on a dating app. They met 3 weeks ago and had unprotected oral sex only. This partner texted him yesterday and informed him he has syphilis. Pt does not have any sx currently. No genital or palmar lesions. He has had syphilis before. 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  He is followed by Dr. Evette Doffing for his HIV. Last saw 03/07/15 and viral load undetectable. HIV diagnosed 40.  Elevated bp today. States because he is nervous. Takes toprol and zestoretic. PCP Dr. Arcelia Jew.  Review of Systems:  Review of Systems See HPI  Patient Active Problem List   Diagnosis Date Noted  . ARF (acute renal failure) ([place]) 03/07/2015  . Adult acne 02/17/2015  . 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 Syphilis 06/08/2013  . Health care maintenance 10/01/2011  . Human immunodeficiency virus (HIV) disease ([place]) 02/24/2006  . Hyperlipidemia 02/24/2006  . Essential hypertension 02/24/2006    Prior to Admission medications   Medication Sig Start Date End Date Taking? Authorizing Provider  Abacavir-Dolutegravir-Lamivud [id number] MG TABS Take 1 tablet by mouth daily. 05/22/14  Yes Campbell Riches, MD  amLODipine (NORVASC) 10 MG tablet Take 1 tablet (10 mg total) by mouth daily. 03/07/15 03/06/16 Yes Norman Herrlich, MD  glucose blood test strip 1 each by Other route 2 (two) times daily. One touch verio iq.  And lancets  2/day 06/22/14  Yes Renato Shin, MD  Insulin Glargine (LANTUS SOLOSTAR) 100 UNIT/ML Solostar Pen Inject 77 Units into the skin daily. 03/07/15  Yes Norman Herrlich, MD  Insulin Pen Needle (B-D UF III MINI PEN NEEDLES) 31G X 5 MM MISC Use to inject Lantus once daily 03/14/14  Yes Blain Pais, MD  lisinopril-hydrochlorothiazide (PRINZIDE,ZESTORETIC) 20-25 MG tablet Take 1 tablet by mouth daily. 03/23/15  Yes Milagros Loll, MD  metFORMIN (GLUCOPHAGE) 1000 MG tablet Take 1 tablet (1,000 mg total) by mouth 2 (two) times daily with a meal. 03/07/15 03/06/16 Yes Norman Herrlich, MD  [facility] DELICA LANCETS MISC Use to check blood sugar up to 3 times daily Dx code: 250.00 09/06/10  Yes Rosalia Hammers, MD  Clindamycin-Benzoyl Per, Refr, gel Apply topically to face in morning Patient not taking: Reported on 04/21/2015 02/17/15   Juluis Mire, MD  Liraglutide 18 MG/3ML SOPN Inject subcutaneously 0.6 mg once daily for 1 week; then increase to 1.2 mg once daily. Patient not taking: Reported on 04/21/2015 03/23/15   Milagros Loll, MD  metoprolol succinate (TOPROL-XL) 25 MG 24 hr tablet Take 1 tablet (25 mg total) by mouth daily. Patient not taking: Reported on 04/21/2015 03/23/15   Milagros Loll, MD  tretinoin (RETIN-A) 0.05 % cream Apply topically at bedtime. Patient not taking: Reported on 03/23/2015 02/16/15   Juluis Mire, MD    No Known Allergies  History reviewed. No pertinent past surgical history.  Social  History  Substance Use Topics  . Smoking status: Never Smoker   . Smokeless tobacco: Never Used  . Alcohol Use: No    Family History  Problem Relation Age of Onset  . Kidney disease Mother   . Diabetes Mother   . Heart disease Mother   . Kidney disease Father   . Diabetes Maternal Aunt   . Diabetes Maternal Uncle   . Coronary artery disease Maternal Grandfather   . Diabetes Maternal Grandmother     Medication list has been reviewed and updated.  Physical Examination:  Physical  Exam  Constitutional: He is oriented to person, place, and time. He appears well-developed and well-nourished. No distress.  HENT:  Head: Normocephalic and atraumatic.  Right Ear: Hearing normal.  Left Ear: Hearing normal.  Nose: Nose normal.  Mouth/Throat: Uvula is midline, oropharynx is clear and moist and mucous membranes are normal.  Eyes: Conjunctivae and lids are normal. Right eye exhibits no discharge. Left eye exhibits no discharge. No scleral icterus.  Pulmonary/Chest: Effort normal. No respiratory distress.  Musculoskeletal: Normal range of motion.  Lymphadenopathy:       Head (right side): No submental, no submandibular and no tonsillar adenopathy present.       Head (left side): No submental, no submandibular and no tonsillar adenopathy present.    He has no cervical adenopathy.       Right: No supraclavicular adenopathy present.       Left: No supraclavicular adenopathy present.  Neurological: He is alert and oriented to person, place, and time.  Skin: Skin is warm, dry and intact. No lesion and no rash noted.  No palmar lesions  Psychiatric: He has a normal mood and affect. His speech is normal and behavior is normal. Thought content normal.   BP 152/102 mmHg  Pulse 92  Temp(Src) 98 F (36.7 C) (Oral)  Resp 18  Ht 6' (1.829 m)  Wt 223 lb 3.2 oz (101.243 kg)  BMI 30.26 kg/m2  SpO2 98%  Assessment and Plan:  1. Exposure to syphilis 2. Screen for STD Gave 2.4 units bicillin today. STD testing pending. He will make appt with ID, Dr. [name], to discuss need for further treatment. Discussed safe sex practices. - RPR - penicillin g benzathine (BICILLIN LA) 1200000 UNIT/2ML injection 2.4 Million Units; Inject 4 mLs (2.4 Million Units total) into the muscle once. - Gonococcus culture - Chlamydia culture - GC/Chlamydia Probe Amp  3. Elevated BP Elevated BP here. HTN treated with zestoretic and toprol. Very possible that bp elevated d/t nerves. F/u with  PCP.   Benjaman Pott Drenda Freeze, MHS [facility]  04/21/2015

## 2015-04-21 NOTE — Patient Instructions (Signed)
I will call you with your lab results. Call Dr. Evette Doffing on Monday to make appt to discuss need for further penicillin injections. Protection with all sexual encounters. Return as needed.

## 2015-04-23 LAB — GONOCOCCUS CULTURE

## 2015-04-23 LAB — GC/CHLAMYDIA PROBE AMP
CT PROBE, AMP APTIMA: NOT DETECTED
GC Probe RNA: NOT DETECTED

## 2015-04-23 LAB — RPR TITER: RPR Titer: 1:16 {titer} — AB

## 2015-04-23 LAB — RPR: RPR: REACTIVE — AB

## 2015-04-23 LAB — FLUORESCENT TREPONEMAL AB(FTA)-IGG-BLD: Fluorescent Treponemal ABS: REACTIVE — AB

## 2015-04-26 NOTE — Addendum Note (Signed)
Addended by: Hulan Fray on: 04/26/2015 07:50 PM   Modules accepted: Orders

## 2015-04-27 LAB — CHLAMYDIA CULTURE

## 2015-05-01 ENCOUNTER — Telehealth: Payer: Self-pay | Admitting: Internal Medicine

## 2015-05-01 NOTE — Telephone Encounter (Signed)
Received page from outside number at [phone]. Returned phone call, but no answer, and the voice mailbox was full.  Osa Craver, DO PGY-2 Internal Medicine Resident Pager # [phone] 05/01/2015 6:18 PM

## 2015-05-02 ENCOUNTER — Telehealth: Payer: Self-pay | Admitting: Internal Medicine

## 2015-05-02 ENCOUNTER — Encounter: Payer: Self-pay | Admitting: *Deleted

## 2015-05-02 DIAGNOSIS — R809 Proteinuria, unspecified: Principal | ICD-10-CM

## 2015-05-02 DIAGNOSIS — E0829 Diabetes mellitus due to underlying condition with other diabetic kidney complication: Secondary | ICD-10-CM

## 2015-05-02 DIAGNOSIS — Z794 Long term (current) use of insulin: Principal | ICD-10-CM

## 2015-05-02 MED ORDER — INSULIN GLARGINE 100 UNIT/ML SOLOSTAR PEN: 77.0000 [IU] | PEN_INJECTOR | Freq: Every day | SUBCUTANEOUS | Status: DC

## 2015-05-02 NOTE — Telephone Encounter (Signed)
   Reason for call:   I received a call from [name] at 637 PM indicating he has run out of Lantus.   Pertinent Data:   We he tried calling the pharmacy, he was told he could not fill Lantus. He reports he normally takes 80 units at bedtime though acknowledges his prescription is noted for 77 units.  He denies nausea, vomiting, abdominal pain, dizziness, blurry vision.or other symptoms suggestive of DKA which he recalls vividly from the past.   Assessment / Plan / Recommendations:   I told him I would send a new referral to the pharmacy and would call to inquire if they got it. Should any additional problems arise, he was instructed to call the on-call provider again.  On speaking with his pharmacist, it appears his insurance no longer covers Lantus and would prefer Basaglar instead. The pharmacist confirmed to me that there was no difference with regards to dosing and administration of this medication though is agreeable to teaching the patient. I thus sent a prescription for Basaglar 77 units to be taken at bedtime for 3 mL per pen 10 given that he is using 0.77 units per night and that this amount would last him for 30 days. I also sent 11 refills for a year supply.  As always, pt is advised that if symptoms worsen or new symptoms arise, they should go to an urgent care facility or to to ER for further evaluation.   Riccardo Dubin, MD   05/02/2015, 6:54 PM

## 2015-05-04 ENCOUNTER — Telehealth: Payer: Self-pay | Admitting: *Deleted

## 2015-05-04 NOTE — Telephone Encounter (Signed)
Received faxed documentation from pt's pharmacy for pt's Lantus.  Basaglar ("generic" Lantus)  insulin is now the preferred.  Insulin was changed by after-hours MD last night after receiving call from patient.  I contacted pt's pharmacy this AM to confirm that insurance covered medication.  Insurance paid and pt has picked up medication-phone call complete.[name], Darlene [name]2/17/201710:06 AM

## 2015-05-25 ENCOUNTER — Other Ambulatory Visit: Payer: Self-pay | Admitting: *Deleted

## 2015-05-25 DIAGNOSIS — B20 Human immunodeficiency virus [HIV] disease: Secondary | ICD-10-CM

## 2015-05-25 MED ORDER — ABACAVIR-DOLUTEGRAVIR-LAMIVUD 600-50-300 MG PO TABS: 1.0000 | ORAL_TABLET | Freq: Every day | ORAL | Status: DC

## 2015-06-20 ENCOUNTER — Telehealth: Payer: Self-pay | Admitting: Pulmonary Disease

## 2015-06-20 ENCOUNTER — Other Ambulatory Visit: Payer: Self-pay | Admitting: Internal Medicine

## 2015-06-20 DIAGNOSIS — Z794 Long term (current) use of insulin: Principal | ICD-10-CM

## 2015-06-20 DIAGNOSIS — E119 Type 2 diabetes mellitus without complications: Secondary | ICD-10-CM

## 2015-06-20 MED ORDER — INSULIN PEN NEEDLE 31G X 5 MM MISC: Status: DC

## 2015-06-20 NOTE — Telephone Encounter (Signed)
   Reason for call:   I received a call from [name] at 7:20 PM indicating he needed a refill of his pen needles.   Pertinent Data:   He ran out of his pen needles and wants a refill sent to [facility] [address].   Assessment / Plan / Recommendations:   Rx sent.  As always, pt is advised that if symptoms worsen or new symptoms arise, they should go to an urgent care facility or to to ER for further evaluation.   Milagros Loll, MD   06/20/2015, 7:20 PM

## 2015-07-01 ENCOUNTER — Other Ambulatory Visit: Payer: Self-pay | Admitting: Internal Medicine

## 2015-07-02 ENCOUNTER — Telehealth: Payer: Self-pay

## 2015-07-02 NOTE — Telephone Encounter (Signed)
Pt requesting the nurse to call back. 

## 2015-07-02 NOTE — Telephone Encounter (Signed)
Return call from pt-who was not avail to answer my call.  Spoke with his cousin [name] who stated pt was calling for a refill of his insulin.  Pt broke his leg and is currently staying in [place].  She states pt does not have any meds with him and is [facility] [place] [facility] at [facility] [address]  Pt's cousin also spelled out Triumeq-at this point, she was informed that I will need to speak to patient directly.  Pt returned call-he wish to have his insulin sent to the pharmacy in [place] and stated he does not need a refill on the Triumeq at this time.  I went over pt's BP meds with him over the phone and pt is currently taking the following meds. Lisinopril 20mg  daily, HCTZ 12.5mg  daily, and amlodipine 10mg  daily.  Per pt's chart, he had medications changes during his 03/23/2015 see copy of plan below:  Medications: Changed lisinopril and hydrochlorothiazide to combination pill lisinopril-hydrochlorothiazide 20-25 mg daily. Continue amlodipine 10 mg daily. Start metoprolol succinate 25 mg daily. Educational resources provided: brochure, handout, video Other plans:  -Follow up in 2-3 weeks.  Per pt, he never started the metoprolol and or the combination lisinopril/hctz.  Pt states his bp was elevated in the ED.   Will forward info to PCP to see which meds/rx will need to be sent to the new pharmacy- what he is currently taking vs what he should be taking.  Please advise.[name] Hidden [name]4/17/20173:08 PM  New pharmacy was contacted-they will not be able fill the [employer] until 04/20-pt aware.  Of note, pt states he has never taken the Victoza that was sent in on 03/23/2015.[name], [name] [place] [name]4/17/20173:38 PM

## 2015-07-02 NOTE — Telephone Encounter (Signed)
Called pt back, he states that he has spoken to Warden and she is fixing his problem

## 2015-07-03 NOTE — Telephone Encounter (Signed)
As far as BP medications I would like the patient to take the following:  Lisinopril-HCTZ 20-25 mg daily (can be combo pill or separate medications) Amlodipine 10 mg daily Metoprolol succinate 25 mg daily  Thanks! Marbeth Smedley

## 2015-07-04 NOTE — Telephone Encounter (Signed)
Message sent to front office to schedule pt an appt. 

## 2015-07-04 NOTE — Telephone Encounter (Signed)
Last appt 03/23/15 with Dr Randell Patient. No f/u appt scheduled.

## 2015-07-04 NOTE — Telephone Encounter (Signed)
Please schedule him an appointment in the next month.Thanks.

## 2015-07-27 ENCOUNTER — Ambulatory Visit: Payer: BC Managed Care – PPO | Admitting: Internal Medicine

## 2015-07-30 ENCOUNTER — Ambulatory Visit: Payer: BC Managed Care – PPO | Admitting: Internal Medicine

## 2015-08-07 ENCOUNTER — Other Ambulatory Visit: Payer: Self-pay | Admitting: Pulmonary Disease

## 2015-08-22 ENCOUNTER — Other Ambulatory Visit: Payer: Self-pay | Admitting: Infectious Diseases

## 2015-08-22 ENCOUNTER — Other Ambulatory Visit: Payer: BC Managed Care – PPO

## 2015-08-22 DIAGNOSIS — B20 Human immunodeficiency virus [HIV] disease: Secondary | ICD-10-CM

## 2015-09-02 ENCOUNTER — Other Ambulatory Visit: Payer: Self-pay | Admitting: Internal Medicine

## 2015-09-03 ENCOUNTER — Ambulatory Visit: Payer: BC Managed Care – PPO | Admitting: Dietician

## 2015-09-03 ENCOUNTER — Encounter: Payer: Self-pay | Admitting: Internal Medicine

## 2015-09-03 ENCOUNTER — Ambulatory Visit (INDEPENDENT_AMBULATORY_CARE_PROVIDER_SITE_OTHER): Payer: BC Managed Care – PPO | Admitting: Internal Medicine

## 2015-09-03 VITALS — BP 179/102 | HR 108 | Temp 97.9°F | Ht 72.0 in | Wt 209.3 lb

## 2015-09-03 DIAGNOSIS — E1129 Type 2 diabetes mellitus with other diabetic kidney complication: Secondary | ICD-10-CM | POA: Diagnosis not present

## 2015-09-03 DIAGNOSIS — I10 Essential (primary) hypertension: Secondary | ICD-10-CM

## 2015-09-03 DIAGNOSIS — E0829 Diabetes mellitus due to underlying condition with other diabetic kidney complication: Secondary | ICD-10-CM

## 2015-09-03 DIAGNOSIS — R809 Proteinuria, unspecified: Secondary | ICD-10-CM | POA: Diagnosis not present

## 2015-09-03 DIAGNOSIS — Z9889 Other specified postprocedural states: Secondary | ICD-10-CM | POA: Insufficient documentation

## 2015-09-03 DIAGNOSIS — B20 Human immunodeficiency virus [HIV] disease: Secondary | ICD-10-CM

## 2015-09-03 DIAGNOSIS — N179 Acute kidney failure, unspecified: Secondary | ICD-10-CM | POA: Diagnosis not present

## 2015-09-03 DIAGNOSIS — Z794 Long term (current) use of insulin: Principal | ICD-10-CM

## 2015-09-03 DIAGNOSIS — Z7984 Long term (current) use of oral hypoglycemic drugs: Secondary | ICD-10-CM

## 2015-09-03 DIAGNOSIS — Z21 Asymptomatic human immunodeficiency virus [HIV] infection status: Secondary | ICD-10-CM

## 2015-09-03 LAB — POCT GLYCOSYLATED HEMOGLOBIN (HGB A1C): Hemoglobin A1C: 5.5

## 2015-09-03 LAB — GLUCOSE, CAPILLARY: GLUCOSE-CAPILLARY: 74 mg/dL (ref 65–99)

## 2015-09-03 MED ORDER — GLUCOSE BLOOD VI STRP
1.0000 | ORAL_STRIP | Freq: Two times a day (BID) | Status: DC
Start: 2015-09-03 — End: 2017-08-13

## 2015-09-03 MED ORDER — HYDROCHLOROTHIAZIDE 25 MG PO TABS: 25.0000 mg | ORAL_TABLET | Freq: Every day | ORAL | Status: DC

## 2015-09-03 MED ORDER — INSULIN PEN NEEDLE 31G X 5 MM MISC: Status: DC

## 2015-09-03 MED ORDER — METOPROLOL SUCCINATE ER 25 MG PO TB24: 25.0000 mg | ORAL_TABLET | Freq: Every day | ORAL | Status: DC

## 2015-09-03 MED ORDER — LISINOPRIL 40 MG PO TABS: 40.0000 mg | ORAL_TABLET | Freq: Every day | ORAL | Status: DC

## 2015-09-03 NOTE — Assessment & Plan Note (Signed)
His left leg was swollen compared to his right leg. I do not suspect a DVT at this time. He was initially tachycardic when he came to the clinic because he had to walk from the parking deck to clinic using a walker. When I examined him he was no longer tachycardic. Additionally, he had no pain to palpation of his leg on exam and no erythema. He denied any chest pain, SOB, or hemoptysis. He was seen by Orthopedics recently to have the cast taken off and he said it was swollen when this happened. I recommended for him to return to clinic if he develops any of the aforementioned symptoms. Will see him in 2 weeks to reassess his BP and reassess his leg at that time.

## 2015-09-03 NOTE — Progress Notes (Signed)
   Subjective:    Patient ID: [name] [name], male    DOB: 26-Apr-1970, 45 y.o.   MRN: [id number]  HPI [name] [name] is a 45yo man with PMHx of HTN, type 2 DM, and HIV who presents today for follow up of his hypertension.  HTN: BP significantly elevated at 205/99 today. Repeat BP 179/102. He is currently taking Norvasc 10 mg daily, HCTZ 25 mg daily, Lisinopril 20 mg daily, and Toprol-XL 25 mg daily.   Type 2 DM: Last A1c 11.4, today his A1c is 5.5. He is taking Metformin 1000 mg BID and Basaglar 77 units QHS. He was prescribed Victoza at his last visit but reports he has not been taking this medication. He notes he recently had knee surgery and has been staying with his family while he is recovering. Because of this, he has been eating more home cooked meals and eating healthier overall.   Acute Kidney Injury: Noted to have a mildly elevated Cr in Dec from his baseline. Cr normally 1.2-1.4 and was 1.6 in Dec. He denies any difficulty urinating.   HIV: Last VL <20 and CD4 430 in Dec 2016. He has missed several appointments with Dr. [name] recently. He has an appointment on 7/17 and reports he plans on making it. He is taking Triumeq.   Recent Left Knee Surgery: Reports he recently had surgery on his left leg after injuring it. He notes the cast was taken off last week and has remained more swollen than his right leg since it was taken off. He denies any pain in his leg. He has follow up with Orthopedics next week. He denies any chest pain or SOB. He is currently using a walker to ambulate.    Review of Systems General: Denies fever, chills, night sweats, changes in weight, changes in appetite HEENT: Denies headaches, ear pain, changes in vision, rhinorrhea, sore throat CV: Denies palpitations, orthopnea Pulm: Denies cough, wheezing GI: Denies abdominal pain, nausea, vomiting, diarrhea, constipation, melena, hematochezia GU: Denies dysuria, hematuria, frequency Msk: Denies muscle cramps, joint  pains Neuro: Denies weakness, numbness, tingling Skin: Denies rashes, bruising Psych: Denies depression, anxiety, hallucinations    Objective:   Physical Exam General: alert, sitting up, pleasant, NAD HEENT: [place]/AT, EOMI, sclera anicteric, mucus membranes moist CV: RRR, no m/g/r Pulm: CTA bilaterally, breaths non-labored Abd: BS+, soft, non-tender Ext: warm, trace edema in the left leg  Neuro: alert and oriented x 3 Skin: There are several scars in the vicinity of his left knee and down his left shin. The left leg appears swollen compared to the right leg. No erythema or tenderness to palpation.      Assessment & Plan:  Please refer to A&P documentation.

## 2015-09-03 NOTE — Assessment & Plan Note (Signed)
His A1c has amazingly come down from 11.4 to 5.5 since living with his family and eating a more balanced diet. I advised him to continue these lifestyle changes once he is back to living on his own again. Will continue Metformin 1000 mg BID and Basaglar 77 units QHS. Eye exam today, will follow up report.

## 2015-09-03 NOTE — Addendum Note (Signed)
Addended by: Truddie Crumble on: 09/03/2015 03:15 PM   Modules accepted: Orders

## 2015-09-03 NOTE — Progress Notes (Signed)
Patient is not a retinal camera candidate due to severe diabetic retinopathy diagnosis. This was explained to him today. He says he will schedule an appointment with Dr. Zigmund Daniel.

## 2015-09-03 NOTE — Assessment & Plan Note (Signed)
His BP is significantly elevated today. Will increase his Lisinopril from 20 to 40 mg daily. Will continue Norvasc 10 mg daily, HCTZ 25 mg daily, and Toprol-XL 25 mg daily. I will have him return in 2 weeks to reassess his blood pressure. Will also check a bmet today to see how his kidney function is doing. May need to consider secondary hypertension work up if BP remains elevated.

## 2015-09-03 NOTE — Progress Notes (Signed)
The pro1200 order was canceled.

## 2015-09-03 NOTE — Assessment & Plan Note (Signed)
Emphasized to him that he needs to keep his appointment with Dr. [name] next month since he has missed his last few appointments. Patient agreed. Continue Triumeq per ID. Appreciate Dr. Algis Downs care of this patient.

## 2015-09-03 NOTE — Patient Instructions (Addendum)
Please reschedule your appointment with Dr. Tempie Hoist or call us for a referral to another Retinal specialist.   Diabetic Retinopathy Diabetic retinopathy is a disease of the light-sensitive membrane at the back of the eye (retina). It is a complication of diabetes and a common cause of blindness. Early detection of the disease is key to keeping your eyes healthy.  CAUSES  Diabetic retinopathy is caused by blood sugar (glucose) levels that are too high over an extended period of time. High blood sugars cause damage to the small blood vessels of the retina, allowing blood to leak through the vessel walls. This causes visual impairment and eventually blindness. RISK FACTORS  High blood pressure.  Having diabetes for a long time.  Having poorly controlled blood sugars. SIGNS AND SYMPTOMS  In the early stages of diabetic retinopathy, there are often no symptoms. As the condition advances, symptoms may include:  Blurred vision. This is usually caused by a swelling due to abnormal blood glucose levels. The blurriness may go away when blood glucose levels return to normal.  Moving specks or dark spots (floaters) in your vision. These can be caused by a small retinal hemorrhage. A hemorrhage is bleeding from blood vessels.  Missing parts of your field of vision, such as things at the side. This can be caused by larger retinal hemorrhages.  Difficulty reading books or signs.  Double vision.  Pain in one or both eyes.  Feeling pressure in one or both eyes.  Trouble seeing straight lines. Straight lines do not look straight.  Redness of the eyes that does not go away. DIAGNOSIS  Your eye care specialist can detect changes in the blood vessels of your eye by putting drops in your eyes that enlarge (dilate) your pupils. This allows your eye care specialist to get a good look at your retina to see if there are any changes that have occurred as a result of your diabetes. You should have your  eyes examined once a year. TREATMENT  Your eye care specialist may use a special laser beam to seal the blood vessels of the retina and stop them from leaking. Early detection and treatment are important so that further damage to your eyes can be prevented. In addition, managing your blood sugars and keeping them in the target range can slow the progress of the disease. HOME CARE INSTRUCTIONS   Keep your blood pressure within your target range.  Keep your blood glucose levels within your target range.  Follow your health care provider's instructions regarding diet and other means for controlling your blood glucose levels.  Check your blood levels for glucose as recommended by your health care provider.  Keep regular appointments with your eye specialist. An eye specialist can usually see diabetic retinopathy developing long before it starts causing problems. In many cases, it can be treated to prevent complications from occurring. If you have diabetes, you should have your eyes checked at least every year. Your risk of retinopathy increases the longer you have the disease.  If you smoke, quit. Ask your health care provider for help if needed. Smoking can make retinopathy worse. SEEK MEDICAL CARE IF:   You notice gradual blurring or other changes in your vision over time.  You notice that your glasses or contact lenses do not make things look as sharp as they once did.  You have trouble reading or seeing details at a distance with either eye.  You notice a sudden change in your vision or notice  that parts of your field of vision appear missing or hazy.  You suddenly see moving specks or dark spots in the field of vision of either eye.  You have sudden partial loss of vision in either eye.   This information is not intended to replace advice given to you by your health care provider. Make sure you discuss any questions you have with your health care provider.   Document Released:  02/29/2000 Document Revised: 12/22/2012 Document Reviewed: 08/23/2012 Elsevier Interactive Patient Education [employer].

## 2015-09-03 NOTE — Assessment & Plan Note (Signed)
Cr noted to be mildly elevated from baseline 1.2-1.4 to 1.6 in Dec 2016. Will recheck a bmet today to see how his kidney function is doing.

## 2015-09-03 NOTE — Patient Instructions (Addendum)
General Instructions: - Continue your diabetes regimen- you are doing great! Very proud of you for improving your A1c. - Eye exam today - Please make it to your appointment with Dr. [name] next month - We will increase your Lisinopril to 40 mg daily since your blood pressure is still elevated   Please bring your medicines with you each time you come to clinic.  Medicines may include prescription medications, over-the-counter medications, herbal remedies, eye drops, vitamins, or other pills.   Progress Toward Treatment Goals:  Treatment Goal 03/23/2015  Hemoglobin A1C improved  Blood pressure improved    Self Care Goals & Plans:  Self Care Goal 03/23/2015  Manage my medications take my medicines as prescribed; bring my medications to every visit; refill my medications on time; follow the sick day instructions if I am sick  Monitor my health keep track of my blood glucose; bring my glucose meter and log to each visit; keep track of my weight; check my feet daily  Eat healthy foods eat more vegetables; eat fruit for snacks and desserts; eat foods that are low in salt; eat baked foods instead of fried foods; eat smaller portions; drink diet soda or water instead of juice or soda  Be physically active find an activity I enjoy    No flowsheet data found.   Care Management & Community Referrals:  No flowsheet data found.

## 2015-09-04 LAB — BMP8+ANION GAP
Anion Gap: 18 mmol/L (ref 10.0–18.0)
BUN/Creatinine Ratio: 14 (ref 9–20)
BUN: 18 mg/dL (ref 6–24)
CALCIUM: 8.8 mg/dL (ref 8.7–10.2)
CO2: 19 mmol/L (ref 18–29)
CREATININE: 1.26 mg/dL (ref 0.76–1.27)
Chloride: 104 mmol/L (ref 96–106)
GFR, EST AFRICAN AMERICAN: 79 mL/min/{1.73_m2} (ref >59)
GFR, EST NON AFRICAN AMERICAN: 68 mL/min/{1.73_m2} (ref >59)
Glucose: 57 mg/dL — ABNORMAL LOW (ref 65–99)
Potassium: 3.8 mmol/L (ref 3.5–5.2)
Sodium: 141 mmol/L (ref 134–144)

## 2015-09-04 NOTE — Progress Notes (Signed)
Internal Medicine Clinic Attending  Case discussed with Dr. Rivet soon after the resident saw the patient.  We reviewed the resident's history and exam and pertinent patient test results.  I agree with the assessment, diagnosis, and plan of care documented in the resident's note.  

## 2015-09-05 ENCOUNTER — Ambulatory Visit: Payer: BC Managed Care – PPO | Admitting: Infectious Diseases

## 2015-09-12 ENCOUNTER — Other Ambulatory Visit: Payer: Self-pay | Admitting: Internal Medicine

## 2015-09-21 ENCOUNTER — Other Ambulatory Visit: Payer: Self-pay | Admitting: Infectious Diseases

## 2015-10-01 ENCOUNTER — Ambulatory Visit (INDEPENDENT_AMBULATORY_CARE_PROVIDER_SITE_OTHER): Payer: BC Managed Care – PPO | Admitting: Infectious Diseases

## 2015-10-01 ENCOUNTER — Encounter: Payer: Self-pay | Admitting: Infectious Diseases

## 2015-10-01 VITALS — BP 171/112 | HR 95 | Temp 98.6°F | Wt 208.0 lb

## 2015-10-01 DIAGNOSIS — Z794 Long term (current) use of insulin: Secondary | ICD-10-CM

## 2015-10-01 DIAGNOSIS — B20 Human immunodeficiency virus [HIV] disease: Secondary | ICD-10-CM

## 2015-10-01 DIAGNOSIS — A539 Syphilis, unspecified: Secondary | ICD-10-CM

## 2015-10-01 DIAGNOSIS — Z113 Encounter for screening for infections with a predominantly sexual mode of transmission: Secondary | ICD-10-CM

## 2015-10-01 DIAGNOSIS — Z79899 Other long term (current) drug therapy: Secondary | ICD-10-CM

## 2015-10-01 DIAGNOSIS — I10 Essential (primary) hypertension: Secondary | ICD-10-CM | POA: Diagnosis not present

## 2015-10-01 DIAGNOSIS — R809 Proteinuria, unspecified: Secondary | ICD-10-CM

## 2015-10-01 DIAGNOSIS — E0829 Diabetes mellitus due to underlying condition with other diabetic kidney complication: Secondary | ICD-10-CM

## 2015-10-01 DIAGNOSIS — Z9889 Other specified postprocedural states: Secondary | ICD-10-CM

## 2015-10-01 DIAGNOSIS — N179 Acute kidney failure, unspecified: Secondary | ICD-10-CM | POA: Diagnosis not present

## 2015-10-01 LAB — CBC
HCT: 36.9 % — ABNORMAL LOW (ref 38.5–50.0)
Hemoglobin: 11.6 g/dL — ABNORMAL LOW (ref 13.2–17.1)
MCH: 24.6 pg — AB (ref 27.0–33.0)
MCHC: 31.4 g/dL — AB (ref 32.0–36.0)
MCV: 78.3 fL — AB (ref 80.0–100.0)
MPV: 8.6 fL (ref 7.5–12.5)
PLATELETS: 372 10*3/uL (ref 140–400)
RBC: 4.71 MIL/uL (ref 4.20–5.80)
RDW: 16.7 % — AB (ref 11.0–15.0)
WBC: 8.5 10*3/uL (ref 3.8–10.8)

## 2015-10-01 NOTE — Assessment & Plan Note (Signed)
Will try to get him in for IM clinic f/u sooner

## 2015-10-01 NOTE — Assessment & Plan Note (Signed)
His RPR has been decreasing. Will repeat today

## 2015-10-01 NOTE — Assessment & Plan Note (Signed)
He appears to be doing well. Will recheck his labs today.  Offered/refused condoms.  Will rtc in 6 months

## 2015-10-01 NOTE — Assessment & Plan Note (Addendum)
Will try to get him into ophhto apprecaite Dr Rivett's outstanding care.

## 2015-10-01 NOTE — Assessment & Plan Note (Signed)
Further surgery this week.  Appreciate ortho f/u.

## 2015-10-01 NOTE — Assessment & Plan Note (Signed)
Will recheck his labs today.  

## 2015-10-01 NOTE — Progress Notes (Signed)
   Subjective:    Patient ID: [name] [name], male    DOB: 1970/08/08, 45 y.o.   MRN: [id number]  HPI 45 yo M with hx of DM2, HTN, syphilis, and HIV+. He was previously on atripla (and KLT?). He quit taking these in 2013 due to N/V.  He was most recently started on triumeq.  He broke his leg and knee (06-2015). He is going back in for further (manipualtion) surgery on 7-21.   HIV 1 RNA QUANT (copies/mL)  Date Value  02/16/2015 <20  05/05/2014 29*  11/02/2013 <20   CD4 T CELL ABS (/uL)  Date Value  02/16/2015 430  05/05/2014 280*  11/02/2013 270*   No problems with his ART.  His anti-htn was recently changed to lisinopril, amlodipine, metoprolol.  FSG have been "great". His last A1C was 5%.  Has not been able to get into ophtho due to missed appts.   Review of Systems  Constitutional: Negative for appetite change and unexpected weight change.  Eyes: Negative for visual disturbance.  Respiratory: Negative for shortness of breath.   Cardiovascular: Negative for chest pain.  Gastrointestinal: Negative for diarrhea and constipation.  Genitourinary: Negative for difficulty urinating.  Neurological: Negative for headaches.       Objective:   Physical Exam  Constitutional: He appears well-developed and well-nourished.  HENT:  Mouth/Throat: No oropharyngeal exudate.  Eyes: EOM are normal. Pupils are equal, round, and reactive to light.  Neck: Neck supple.  Cardiovascular: Normal rate, regular rhythm and normal heart sounds.   Pulmonary/Chest: Effort normal and breath sounds normal.  Abdominal: Soft. Bowel sounds are normal. There is no tenderness. There is no rebound.  Musculoskeletal: He exhibits no edema.  Lymphadenopathy:    He has no cervical adenopathy.       Assessment & Plan:

## 2015-10-02 LAB — COMPREHENSIVE METABOLIC PANEL
ALT: 23 U/L (ref 9–46)
AST: 23 U/L (ref 10–40)
Albumin: 3.1 g/dL — ABNORMAL LOW (ref 3.6–5.1)
Alkaline Phosphatase: 74 U/L (ref 40–115)
BUN: 16 mg/dL (ref 7–25)
CHLORIDE: 107 mmol/L (ref 98–110)
CO2: 21 mmol/L (ref 20–31)
CREATININE: 1.43 mg/dL — AB (ref 0.60–1.35)
Calcium: 9.4 mg/dL (ref 8.6–10.3)
Glucose, Bld: 45 mg/dL — ABNORMAL LOW (ref 65–99)
POTASSIUM: 4.1 mmol/L (ref 3.5–5.3)
SODIUM: 141 mmol/L (ref 135–146)
Total Bilirubin: 0.2 mg/dL (ref 0.2–1.2)
Total Protein: 8.2 g/dL — ABNORMAL HIGH (ref 6.1–8.1)

## 2015-10-02 LAB — T-HELPER CELL (CD4) - (RCID CLINIC ONLY)
CD4 % Helper T Cell: 20 % — ABNORMAL LOW (ref 33–55)
CD4 T CELL ABS: 550 /uL (ref 400–2700)

## 2015-10-02 LAB — HIV-1 RNA QUANT-NO REFLEX-BLD: HIV 1 RNA Quant: <20 copies/mL (ref <20)

## 2015-10-02 LAB — LIPID PANEL
CHOL/HDL RATIO: 3.5 ratio (ref <=5.0)
CHOLESTEROL: 173 mg/dL (ref 125–200)
HDL: 49 mg/dL (ref >=40)
LDL Cholesterol: 100 mg/dL (ref <130)
TRIGLYCERIDES: 121 mg/dL (ref <150)
VLDL: 24 mg/dL (ref <30)

## 2015-10-02 LAB — RPR TITER: RPR Titer: 1:4 {titer}

## 2015-10-02 LAB — FLUORESCENT TREPONEMAL AB(FTA)-IGG-BLD: FLUORESCENT TREPONEMAL ABS: REACTIVE — AB

## 2015-10-02 LAB — RPR: RPR: REACTIVE — AB

## 2015-10-02 LAB — URINE CYTOLOGY ANCILLARY ONLY
Chlamydia: NEGATIVE
Neisseria Gonorrhea: NEGATIVE

## 2015-11-15 ENCOUNTER — Other Ambulatory Visit: Payer: Self-pay | Admitting: *Deleted

## 2015-11-15 NOTE — Telephone Encounter (Signed)
ERROR

## 2015-12-19 ENCOUNTER — Other Ambulatory Visit: Payer: Self-pay | Admitting: Internal Medicine

## 2015-12-19 NOTE — Telephone Encounter (Signed)
Lisinopril was refilled on 09/03/15 w/5 refills - called pt - no answer; left message for pt to [facility]. Give [place] a call back for any other questions.

## 2015-12-19 NOTE — Telephone Encounter (Signed)
Pt requesting a refill on lisinopril (PRINIVIL,ZESTRIL) 40 MG tablet

## 2015-12-24 ENCOUNTER — Telehealth: Payer: Self-pay | Admitting: Internal Medicine

## 2015-12-24 NOTE — Telephone Encounter (Signed)
APT. REMINDER CALL, LMTCB °

## 2015-12-25 ENCOUNTER — Encounter: Payer: Self-pay | Admitting: Internal Medicine

## 2015-12-25 ENCOUNTER — Ambulatory Visit (INDEPENDENT_AMBULATORY_CARE_PROVIDER_SITE_OTHER): Payer: BC Managed Care – PPO | Admitting: Internal Medicine

## 2015-12-25 VITALS — BP 162/93 | HR 90 | Temp 98.1°F | Ht 72.0 in | Wt 231.4 lb

## 2015-12-25 DIAGNOSIS — L709 Acne, unspecified: Secondary | ICD-10-CM

## 2015-12-25 DIAGNOSIS — E1129 Type 2 diabetes mellitus with other diabetic kidney complication: Secondary | ICD-10-CM

## 2015-12-25 DIAGNOSIS — Z794 Long term (current) use of insulin: Secondary | ICD-10-CM | POA: Diagnosis not present

## 2015-12-25 DIAGNOSIS — N179 Acute kidney failure, unspecified: Secondary | ICD-10-CM | POA: Diagnosis not present

## 2015-12-25 DIAGNOSIS — R809 Proteinuria, unspecified: Secondary | ICD-10-CM | POA: Diagnosis not present

## 2015-12-25 DIAGNOSIS — L989 Disorder of the skin and subcutaneous tissue, unspecified: Secondary | ICD-10-CM

## 2015-12-25 DIAGNOSIS — I10 Essential (primary) hypertension: Secondary | ICD-10-CM

## 2015-12-25 DIAGNOSIS — E0829 Diabetes mellitus due to underlying condition with other diabetic kidney complication: Secondary | ICD-10-CM

## 2015-12-25 DIAGNOSIS — Z79899 Other long term (current) drug therapy: Secondary | ICD-10-CM

## 2015-12-25 LAB — POCT GLYCOSYLATED HEMOGLOBIN (HGB A1C): HEMOGLOBIN A1C: 6.6

## 2015-12-25 LAB — GLUCOSE, CAPILLARY: Glucose-Capillary: 240 mg/dL — ABNORMAL HIGH (ref 65–99)

## 2015-12-25 MED ORDER — CLINDAMYCIN PHOS-BENZOYL PEROX 1.2-5 % EX GEL: CUTANEOUS | 0 refills | Status: DC

## 2015-12-25 MED ORDER — LISINOPRIL 40 MG PO TABS: 40.0000 mg | ORAL_TABLET | Freq: Every day | ORAL | 5 refills | Status: DC

## 2015-12-25 MED ORDER — HYDROCHLOROTHIAZIDE 25 MG PO TABS: 25.0000 mg | ORAL_TABLET | Freq: Every day | ORAL | 5 refills | Status: DC

## 2015-12-25 NOTE — Progress Notes (Signed)
   CC: Hypertension  HPI:  [name] is a 45 y.o. man with PMHx as noted below who presents today for follow up of his hypertension.  HTN: BP elevated at 162/93. He admits he ran out of his Lisinopril about 3 weeks ago. [facility] and when he contacted our clinic the prescription had been sent, but he was still unable to get it. He has been taking Amlodipine 10 mg daily, HCTZ 25 mg daily, and Toprol-XL 25 mg daily.   Type 2 DM: Last A1c 5.5, today has increased to 6.6. He admits to dietary indiscretion now that he is able to move around more since his knee surgery. He states that while his leg was recovering from surgery he was getting home cooked meals and eating a lot better. He states his weakness is [place] food and he has been eating this about 3 times per week for the last couple months. He also notes weight gain. Per review of records, it appears he has gained 23 lbs since his last visit. He states he plans on getting "back on track" with his diet. He is currently taking Glargine 77 units QHS and Metformin 1000 mg BID.   Renal Insufficiency: Cr increased from 1.26 in June to 1.43 in July at his visit with Dr. [name]. He denies any difficulty urinating.   "Acne": Reports lesions popped up along the left side of his face/chin. He notes he was prescribed a cream previously which cleared up his "acne" right away. He does not recall the name. He notes the lesions are occasionally itchy.   Past Medical History:  Diagnosis Date  . Depression   . Diabetes mellitus    type II 2006  . Hepatitis B infection    hx of hepatitis infection  . HIV infection ([place])   . Hyperlipidemia   . Hypertension   . Syphilis     Review of Systems:  All negative except per HPI  Physical Exam:  Vitals:   12/25/15 1551  BP: (!) 162/93  Pulse: 90  Temp: 98.1 F (36.7 C)  TempSrc: Oral  SpO2: 100%  Weight: 231 lb 6.4 oz (105 kg)    Height: 6' (1.829 m)   General: middle aged man sitting up, NAD, pleasant HEENT: [place]/AT, EOMI, sclera anicteric, mucus membranes moist CV: RRR, no m/g/r Pulm: CTA bilaterally, breaths non-labored Abd: BS+, soft, non-tender Ext: warm, no peripheral edema  Neuro: alert and oriented x 3 Skin: There are several 1cm lesions along the left side of his face, behind the ear and along the chin. Appear purplish/hyperpigmented, no erythema.   Assessment & Plan:   See Encounters Tab for problem based charting.  Patient discussed with Dr. Dareen Piano

## 2015-12-25 NOTE — Patient Instructions (Signed)
General Instructions: - Blood work today to check on your kidneys - I will refill your Lisinopril but wait to take it until you hear from me about your blood work - Use clindamycin-benzoyl-peroxide cream for your skin lesions  Please bring your medicines with you each time you come to clinic.  Medicines may include prescription medications, over-the-counter medications, herbal remedies, eye drops, vitamins, or other pills.   Progress Toward Treatment Goals:  Treatment Goal 03/23/2015  Hemoglobin A1C improved  Blood pressure improved    Self Care Goals & Plans:  Self Care Goal 12/25/2015  Manage my medications take my medicines as prescribed; bring my medications to every visit; refill my medications on time  Monitor my health -  Eat healthy foods eat more vegetables; eat foods that are low in salt; eat baked foods instead of fried foods  Be physically active find an activity I enjoy  Prevent falls wear appropriate shoes; have my vision checked    No flowsheet data found.   Care Management & Community Referrals:  No flowsheet data found.

## 2015-12-26 ENCOUNTER — Telehealth: Payer: Self-pay | Admitting: Internal Medicine

## 2015-12-26 LAB — BMP8+ANION GAP
Anion Gap: 13 mmol/L (ref 10.0–18.0)
BUN / CREAT RATIO: 12 (ref 9–20)
BUN: 23 mg/dL (ref 6–24)
CHLORIDE: 105 mmol/L (ref 96–106)
CO2: 23 mmol/L (ref 18–29)
Calcium: 8.5 mg/dL — ABNORMAL LOW (ref 8.7–10.2)
Creatinine, Ser: 1.91 mg/dL — ABNORMAL HIGH (ref 0.76–1.27)
GFR calc non Af Amer: 41 mL/min/{1.73_m2} — ABNORMAL LOW (ref >59)
GFR, EST AFRICAN AMERICAN: 48 mL/min/{1.73_m2} — AB (ref >59)
GLUCOSE: 233 mg/dL — AB (ref 65–99)
Potassium: 4.1 mmol/L (ref 3.5–5.2)
SODIUM: 141 mmol/L (ref 134–144)

## 2015-12-26 MED ORDER — HYDRALAZINE HCL 25 MG PO TABS: 25.0000 mg | ORAL_TABLET | Freq: Three times a day (TID) | ORAL | 1 refills | Status: DC

## 2015-12-26 NOTE — Progress Notes (Signed)
Internal Medicine Clinic Attending  Case discussed with Dr. Rivet soon after the resident saw the patient.  We reviewed the resident's history and exam and pertinent patient test results.  I agree with the assessment, diagnosis, and plan of care documented in the resident's note.  

## 2015-12-26 NOTE — Assessment & Plan Note (Signed)
Cr has worsened based on bmet from today. Cr now 1.9, up from 1.4 in July. Possibly secondary to uncontrolled hypertension. He may benefit from a renal US to rule out structural causes. No symptoms of BPH. Will repeat a bmet when he follows up for his hypertension.

## 2015-12-26 NOTE — Telephone Encounter (Signed)
Informed patient that Cr worsened and that he should hold off on restarting Lisinopril. Advised him to start Hydralazine 25 mg TID and to continue his other blood pressure medications. He will follow up in 2 weeks and have repeat bmet.

## 2015-12-26 NOTE — Assessment & Plan Note (Signed)
Advised patient that we need to work on his diet and prevent any further weight gain. We discussed that while his A1c is still in good range that it may not stay that way if he continues to eat the way he is eating now and continues to gain weight. Will continue current regimen. Recommended to work on cooking meals at home and less going out to eat.

## 2015-12-26 NOTE — Assessment & Plan Note (Addendum)
The lesions on his face do not appear to be acne, but I am not sure what it is exactly. He states these lesions appear very similar to the last ones he had. Possibly could be a dermatophyte infection since it is itchy. Will try Clindamycin-benzoyl-peroxide cream since this worked for him last time. Advised patient to return to clinic if it is not improving.

## 2015-12-26 NOTE — Assessment & Plan Note (Signed)
BP is significantly elevated. He ran out of his Lisinopril which is the likely cause. However, with his recent increase in Cr I am hesitant about restarting his Lisinopril without knowing his Cr at this time. Will check a bmet today. Emphasized low sodium diet. Will refill Lisinopril but instructed patient to wait on restarting this medication until we have his lab results back.   Addendum: - Cr now even more elevated at 1.9. Will call patient and instruct him to hold off on restarting Lisinopril. Will have him start Hydralazine 25 mg TID and follow up in 1 week to have BP recheck and repeat bmet.

## 2016-01-04 ENCOUNTER — Other Ambulatory Visit: Payer: Self-pay | Admitting: Infectious Diseases

## 2016-01-04 DIAGNOSIS — Z21 Asymptomatic human immunodeficiency virus [HIV] infection status: Secondary | ICD-10-CM

## 2016-01-04 DIAGNOSIS — B20 Human immunodeficiency virus [HIV] disease: Secondary | ICD-10-CM

## 2016-01-11 ENCOUNTER — Telehealth: Payer: Self-pay

## 2016-01-11 ENCOUNTER — Telehealth: Payer: Self-pay | Admitting: Internal Medicine

## 2016-01-11 NOTE — Telephone Encounter (Signed)
Pt states he has not received new med order for his BP; [facility]  [facility] [address] - left verbal order. And pt states he will call back on Monday to re-schedule his appt since has not started this new med.(f/u appt to re-check BP ).

## 2016-01-11 NOTE — Telephone Encounter (Signed)
APT. REMINDER CALL, LMTCB °

## 2016-01-11 NOTE — Telephone Encounter (Signed)
Please call pt back regarding bp med.

## 2016-01-14 ENCOUNTER — Ambulatory Visit: Payer: BC Managed Care – PPO

## 2016-01-17 ENCOUNTER — Other Ambulatory Visit: Payer: Self-pay | Admitting: *Deleted

## 2016-01-17 DIAGNOSIS — I10 Essential (primary) hypertension: Secondary | ICD-10-CM

## 2016-01-17 NOTE — Telephone Encounter (Signed)
Patient needs to hold off on taking this medication because his Cr has worsened.

## 2016-02-13 ENCOUNTER — Other Ambulatory Visit: Payer: Self-pay | Admitting: *Deleted

## 2016-02-13 DIAGNOSIS — Z794 Long term (current) use of insulin: Principal | ICD-10-CM

## 2016-02-13 DIAGNOSIS — R809 Proteinuria, unspecified: Principal | ICD-10-CM

## 2016-02-13 DIAGNOSIS — E0829 Diabetes mellitus due to underlying condition with other diabetic kidney complication: Secondary | ICD-10-CM

## 2016-02-13 MED ORDER — METFORMIN HCL 1000 MG PO TABS: 1000.0000 mg | ORAL_TABLET | Freq: Two times a day (BID) | ORAL | 3 refills | Status: DC

## 2016-04-03 ENCOUNTER — Telehealth: Payer: Self-pay | Admitting: Internal Medicine

## 2016-04-03 DIAGNOSIS — I10 Essential (primary) hypertension: Secondary | ICD-10-CM

## 2016-04-03 MED ORDER — AMLODIPINE BESYLATE 10 MG PO TABS: 10.0000 mg | ORAL_TABLET | Freq: Every day | ORAL | 0 refills | Status: DC

## 2016-04-03 MED ORDER — METOPROLOL SUCCINATE ER 25 MG PO TB24: 25.0000 mg | ORAL_TABLET | Freq: Every day | ORAL | 0 refills | Status: DC

## 2016-04-03 NOTE — Telephone Encounter (Signed)
   Reason for call:   I received a call from [name] at 5:32 AM indicating he needs refills on his BP medications.    Pertinent Data:   Current medication regimen includes Hydralazine 25 mg TID, Amlodipine 10 mg QD, HCTZ 25 mg QD, and Toprol-XL 25 mg QD.  Med list review shows patient should be running out of Amlodipine and Toprol-XL. He still has refills remaining on Hydralazine and HCTZ.  His last appt with PCP was on 12/25/15 and patient was advised to follow-up in 1 week for BP recheck, however, has not followed up since then.   Patient states he checked his BP on 04/01/16 - SBP 160 and DBP >100.  Denies having any headaches, blurry vision, CP, SOB, or abdominal pain.    Assessment / Plan / Recommendations:   Refill Amlodipine and Toprol-XL for a 30 day supply. [facility] [address] [facility] by the patient.   Patient has been advised to follow-up at the clinic as soon as possible. I will send a message to the front desk to schedule him for an appointment as well.   As always, pt is advised that if new symptoms arise, they should go to an urgent care facility or to to ER for further evaluation.   Shela Leff, MD   04/03/2016, 5:36 AM

## 2016-04-07 ENCOUNTER — Telehealth: Payer: Self-pay | Admitting: *Deleted

## 2016-04-07 ENCOUNTER — Other Ambulatory Visit: Payer: Self-pay | Admitting: Internal Medicine

## 2016-04-07 DIAGNOSIS — I10 Essential (primary) hypertension: Secondary | ICD-10-CM

## 2016-04-07 NOTE — Telephone Encounter (Signed)
Patient needs appt for BP recheck per pcp, thanks!

## 2016-04-07 NOTE — Telephone Encounter (Signed)
Patient needs appointment for BP recheck 

## 2016-04-30 ENCOUNTER — Other Ambulatory Visit: Payer: Self-pay | Admitting: Internal Medicine

## 2016-04-30 DIAGNOSIS — I10 Essential (primary) hypertension: Secondary | ICD-10-CM

## 2016-05-05 ENCOUNTER — Encounter: Payer: Self-pay | Admitting: Pulmonary Disease

## 2016-05-05 ENCOUNTER — Ambulatory Visit (INDEPENDENT_AMBULATORY_CARE_PROVIDER_SITE_OTHER): Payer: BC Managed Care – PPO | Admitting: Pulmonary Disease

## 2016-05-05 ENCOUNTER — Encounter (INDEPENDENT_AMBULATORY_CARE_PROVIDER_SITE_OTHER): Payer: Self-pay

## 2016-05-05 VITALS — BP 173/105 | HR 87 | Temp 98.4°F | Ht 72.0 in | Wt 229.1 lb

## 2016-05-05 DIAGNOSIS — N182 Chronic kidney disease, stage 2 (mild): Secondary | ICD-10-CM

## 2016-05-05 DIAGNOSIS — I10 Essential (primary) hypertension: Secondary | ICD-10-CM

## 2016-05-05 DIAGNOSIS — N183 Chronic kidney disease, stage 3 unspecified: Secondary | ICD-10-CM

## 2016-05-05 DIAGNOSIS — Z23 Encounter for immunization: Secondary | ICD-10-CM | POA: Diagnosis not present

## 2016-05-05 DIAGNOSIS — Z794 Long term (current) use of insulin: Secondary | ICD-10-CM

## 2016-05-05 DIAGNOSIS — E0822 Diabetes mellitus due to underlying condition with diabetic chronic kidney disease: Secondary | ICD-10-CM | POA: Diagnosis not present

## 2016-05-05 DIAGNOSIS — E0829 Diabetes mellitus due to underlying condition with other diabetic kidney complication: Secondary | ICD-10-CM

## 2016-05-05 DIAGNOSIS — Z0000 Encounter for general adult medical examination without abnormal findings: Secondary | ICD-10-CM

## 2016-05-05 DIAGNOSIS — R809 Proteinuria, unspecified: Secondary | ICD-10-CM

## 2016-05-05 DIAGNOSIS — I129 Hypertensive chronic kidney disease with stage 1 through stage 4 chronic kidney disease, or unspecified chronic kidney disease: Secondary | ICD-10-CM | POA: Diagnosis not present

## 2016-05-05 LAB — GLUCOSE, CAPILLARY: Glucose-Capillary: 72 mg/dL (ref 65–99)

## 2016-05-05 LAB — POCT GLYCOSYLATED HEMOGLOBIN (HGB A1C): HEMOGLOBIN A1C: 8.2

## 2016-05-05 MED ORDER — METOPROLOL SUCCINATE ER 50 MG PO TB24: 50.0000 mg | ORAL_TABLET | Freq: Every day | ORAL | 1 refills | Status: DC

## 2016-05-05 MED ORDER — AMLODIPINE BESYLATE 10 MG PO TABS: 10.0000 mg | ORAL_TABLET | Freq: Every day | ORAL | 1 refills | Status: DC

## 2016-05-05 NOTE — Progress Notes (Signed)
   CC: hypertension  HPI:  [name] is a 46 y.o. man with history as noted below here for follow up of his blood pressure.  Just ran out of amlodipine and metoprolol today. Says he did not miss any doses. Not taking hydralzine  Past Medical History:  Diagnosis Date  . Depression   . Diabetes mellitus    type II 2006  . Hepatitis B infection    hx of hepatitis infection  . HIV infection ([place])   . Hyperlipidemia   . Hypertension   . Syphilis     Review of Systems:   No chest pain No dyspnea  Physical Exam:  Vitals:   05/05/16 1534 05/05/16 1559  BP: (!) 189/103 (!) 173/105  Pulse: 89 87  Temp: 98.4 F (36.9 C)   TempSrc: Oral   SpO2: 100%   Weight: 229 lb 1.6 oz (103.9 kg)   Height: 6' (1.829 m)    General Apperance: NAD HEENT: Normocephalic, atraumatic, anicteric sclera Neck: Supple, trachea midline Lungs: Clear to auscultation bilaterally. No wheezes, rhonchi or rales. Breathing comfortably Heart: Regular rate and rhythm, no murmur/rub/gallop Abdomen: Soft, nontender, nondistended, no rebound/guarding Extremities: Warm and well perfused, no edema Skin: No rashes or lesions Neurologic: Alert and interactive. No gross deficits.   Assessment & Plan:   See Encounters Tab for problem based charting.  Patient discussed with Dr. Angelia Mould

## 2016-05-05 NOTE — Patient Instructions (Addendum)
You will be taking a higher dose of metoprolol with the refill. Follow up in 3-4 weeks

## 2016-05-06 LAB — BMP8+ANION GAP
Anion Gap: 17 (ref 10.0–18.0)
BUN / CREAT RATIO: 10 (ref 9–20)
BUN: 24 mg/dL (ref 6–24)
CHLORIDE: 104 mmol/L (ref 96–106)
CO2: 22 mmol/L (ref 18–29)
Calcium: 9 mg/dL (ref 8.7–10.2)
Creatinine, Ser: 2.46 mg/dL — ABNORMAL HIGH (ref 0.76–1.27)
GFR calc Af Amer: 35 — ABNORMAL LOW (ref >59)
GFR calc non Af Amer: 30 — ABNORMAL LOW (ref >59)
GLUCOSE: 73 mg/dL (ref 65–99)
POTASSIUM: 3.8 mmol/L (ref 3.5–5.2)
SODIUM: 143 mmol/L (ref 134–144)

## 2016-05-06 LAB — SPECIMEN STATUS

## 2016-05-06 MED ORDER — BASAGLAR KWIKPEN 100 UNIT/ML ~~LOC~~ SOPN: 80.0000 [IU] | PEN_INJECTOR | Freq: Every day | SUBCUTANEOUS | 3 refills | Status: DC

## 2016-05-06 MED ORDER — METFORMIN HCL 1000 MG PO TABS: 500.0000 mg | ORAL_TABLET | Freq: Two times a day (BID) | ORAL | 3 refills | Status: DC

## 2016-05-06 NOTE — Assessment & Plan Note (Addendum)
Hgb A1c deteriorated to 8.2. He notes some dietary indiscretion.  Plan: Decrease metformin to 543m BID (If eGFR falls between 30 to <45 mL/minute/1.73 m2 during therapy: a dosage reduction of 50% (maximum: 1,000 mg/day) and monitoring of renal function every 3 months is recommended) Increase insulin glargine to 80u daily. Discussed with patient on phone.  Work on [employer]

## 2016-05-06 NOTE — Assessment & Plan Note (Signed)
Assessment: BP uncontrolled today in the [id number] systolic.  Plan: Continue amlodipine 10mg  daily Continue HCTZ 25mg  daily Increase metoprolol succinate from 25mg  daily to 50mg  daily Follow up in 2-3 weeks

## 2016-05-06 NOTE — Assessment & Plan Note (Addendum)
Assessment: Cr worsened 2.5 from 1.9. It has been worsening over time. Suspect due to hypertension and diabetes.  Plan: Will need BMP recheck at follow up Medications adjusted. He will follow up with his ID physician for adjustment of his HIV meds US kidney May need referral to nephrology

## 2016-05-06 NOTE — Assessment & Plan Note (Signed)
Influenza vaccine administered

## 2016-05-07 NOTE — Progress Notes (Signed)
Internal Medicine Clinic Attending  Case discussed with Dr. Krall at the time of the visit.  We reviewed the resident's history and exam and pertinent patient test results.  I agree with the assessment, diagnosis, and plan of care documented in the resident's note.  

## 2016-05-22 ENCOUNTER — Ambulatory Visit (HOSPITAL_COMMUNITY): Payer: BC Managed Care – PPO

## 2016-05-23 ENCOUNTER — Telehealth: Payer: Self-pay | Admitting: Internal Medicine

## 2016-05-23 NOTE — Telephone Encounter (Signed)
APT. REMINDER CALL, LMTCB °

## 2016-05-26 ENCOUNTER — Ambulatory Visit: Payer: BC Managed Care – PPO

## 2016-06-17 ENCOUNTER — Other Ambulatory Visit: Payer: Self-pay | Admitting: Internal Medicine

## 2016-06-17 DIAGNOSIS — I10 Essential (primary) hypertension: Secondary | ICD-10-CM

## 2016-06-17 NOTE — Telephone Encounter (Signed)
Patient needs appointment.

## 2016-06-26 ENCOUNTER — Other Ambulatory Visit: Payer: Self-pay | Admitting: *Deleted

## 2016-06-26 DIAGNOSIS — I10 Essential (primary) hypertension: Secondary | ICD-10-CM

## 2016-06-26 MED ORDER — METOPROLOL SUCCINATE ER 50 MG PO TB24: 50.0000 mg | ORAL_TABLET | Freq: Every day | ORAL | 0 refills | Status: DC

## 2016-07-14 ENCOUNTER — Telehealth: Payer: Self-pay | Admitting: Internal Medicine

## 2016-07-14 NOTE — Telephone Encounter (Signed)
APT. REMINDER CALL, LMTCB °

## 2016-07-15 ENCOUNTER — Encounter: Payer: BC Managed Care – PPO | Admitting: Internal Medicine

## 2016-07-15 ENCOUNTER — Encounter: Payer: Self-pay | Admitting: Internal Medicine

## 2016-07-15 ENCOUNTER — Other Ambulatory Visit: Payer: Self-pay | Admitting: Internal Medicine

## 2016-07-15 DIAGNOSIS — I10 Essential (primary) hypertension: Secondary | ICD-10-CM

## 2016-07-16 NOTE — Telephone Encounter (Signed)
Patient missed appointment with me. Needs to be rescheduled.

## 2016-07-22 ENCOUNTER — Emergency Department (HOSPITAL_COMMUNITY)
Admission: EM | Admit: 2016-07-22 | Discharge: 2016-07-22 | Disposition: A | Discharge status: Home / Self Care | Payer: BC Managed Care – PPO | Attending: Emergency Medicine | Admitting: Emergency Medicine

## 2016-07-22 ENCOUNTER — Encounter (HOSPITAL_COMMUNITY): Payer: Self-pay

## 2016-07-22 DIAGNOSIS — R112 Nausea with vomiting, unspecified: Secondary | ICD-10-CM | POA: Diagnosis present

## 2016-07-22 DIAGNOSIS — R197 Diarrhea, unspecified: Secondary | ICD-10-CM | POA: Insufficient documentation

## 2016-07-22 DIAGNOSIS — Z79899 Other long term (current) drug therapy: Secondary | ICD-10-CM | POA: Insufficient documentation

## 2016-07-22 DIAGNOSIS — I129 Hypertensive chronic kidney disease with stage 1 through stage 4 chronic kidney disease, or unspecified chronic kidney disease: Secondary | ICD-10-CM | POA: Insufficient documentation

## 2016-07-22 DIAGNOSIS — Z794 Long term (current) use of insulin: Secondary | ICD-10-CM | POA: Insufficient documentation

## 2016-07-22 DIAGNOSIS — N183 Chronic kidney disease, stage 3 (moderate): Secondary | ICD-10-CM | POA: Diagnosis not present

## 2016-07-22 DIAGNOSIS — E1122 Type 2 diabetes mellitus with diabetic chronic kidney disease: Secondary | ICD-10-CM | POA: Diagnosis not present

## 2016-07-22 LAB — URINALYSIS, ROUTINE W REFLEX MICROSCOPIC
Bilirubin Urine: NEGATIVE
GLUCOSE, UA: 50 mg/dL — AB
Ketones, ur: NEGATIVE mg/dL
Leukocytes, UA: NEGATIVE
NITRITE: NEGATIVE
PH: 5 (ref 5.0–8.0)
SPECIFIC GRAVITY, URINE: 1.019 (ref 1.005–1.030)
Squamous Epithelial / LPF: NONE SEEN

## 2016-07-22 LAB — CBC
HEMATOCRIT: 38.3 % — AB (ref 39.0–52.0)
HEMOGLOBIN: 12.2 g/dL — AB (ref 13.0–17.0)
MCH: 24.8 pg — ABNORMAL LOW (ref 26.0–34.0)
MCHC: 31.9 g/dL (ref 30.0–36.0)
MCV: 78 fL (ref 78.0–100.0)
Platelets: 341 10*3/uL (ref 150–400)
RBC: 4.91 MIL/uL (ref 4.22–5.81)
RDW: 14.2 % (ref 11.5–15.5)
WBC: 9.6 10*3/uL (ref 4.0–10.5)

## 2016-07-22 LAB — COMPREHENSIVE METABOLIC PANEL
ALBUMIN: 2.7 g/dL — AB (ref 3.5–5.0)
ALT: 178 U/L — ABNORMAL HIGH (ref 17–63)
ANION GAP: 8 (ref 5–15)
AST: 205 U/L — ABNORMAL HIGH (ref 15–41)
Alkaline Phosphatase: 65 U/L (ref 38–126)
BILIRUBIN TOTAL: 0.6 mg/dL (ref 0.3–1.2)
BUN: 34 mg/dL — ABNORMAL HIGH (ref 6–20)
CHLORIDE: 106 mmol/L (ref 101–111)
CO2: 24 mmol/L (ref 22–32)
Calcium: 8.5 mg/dL — ABNORMAL LOW (ref 8.9–10.3)
Creatinine, Ser: 2.78 mg/dL — ABNORMAL HIGH (ref 0.61–1.24)
GFR calc Af Amer: 30 mL/min — ABNORMAL LOW (ref >60)
GFR calc non Af Amer: 26 mL/min — ABNORMAL LOW (ref >60)
Glucose, Bld: 148 mg/dL — ABNORMAL HIGH (ref 65–99)
POTASSIUM: 3.4 mmol/L — AB (ref 3.5–5.1)
SODIUM: 138 mmol/L (ref 135–145)
TOTAL PROTEIN: 8.6 g/dL — AB (ref 6.5–8.1)

## 2016-07-22 LAB — LIPASE, BLOOD: LIPASE: 26 U/L (ref 11–51)

## 2016-07-22 LAB — CBG MONITORING, ED: Glucose-Capillary: 126 mg/dL — ABNORMAL HIGH (ref 65–99)

## 2016-07-22 MED ORDER — ONDANSETRON 4 MG PO TBDP
ORAL_TABLET | ORAL | Status: AC
  Filled 2016-07-22: qty 1

## 2016-07-22 MED ORDER — ONDANSETRON 4 MG PO TBDP
4.0000 mg | ORAL_TABLET | Freq: Once | ORAL | Status: AC | PRN
  Administered 2016-07-22: 4 mg via ORAL

## 2016-07-22 MED ORDER — ONDANSETRON HCL 4 MG PO TABS: 4.0000 mg | ORAL_TABLET | Freq: Four times a day (QID) | ORAL | 0 refills | Status: DC

## 2016-07-22 MED ORDER — SODIUM CHLORIDE 0.9 % IV BOLUS (SEPSIS)
1000.0000 mL | Freq: Once | INTRAVENOUS | Status: AC
  Administered 2016-07-22: 1000 mL via INTRAVENOUS

## 2016-07-22 NOTE — ED Notes (Signed)
Papers and medications reviewed with patient and he verbalizes understanding. 

## 2016-07-22 NOTE — ED Provider Notes (Signed)
[place] DEPT Provider Note   CSN: [id number] Arrival date & time: 07/22/16  0935     History   Chief Complaint Chief Complaint  Patient presents with  . Nausea  . Emesis  . Diarrhea    HPI [name] [name] is a 46 y.o. male.  Patient with N/V/D over the last few days, denies blood in vomit or stool. Patient denies sick contacts. Patient with HIV with last CD4 count 550 and patient states compliant with medication. Patient unable to keep fluids down. Nothing has helped make it worse or better. Patient denies recent abx, travel, suspicious food.    The history is provided by the patient.  Illness  This is a new problem. The current episode started more than 2 days ago. The problem occurs daily. The problem has been gradually improving. Pertinent negatives include no chest pain, no abdominal pain, no headaches and no shortness of breath. The symptoms are aggravated by eating. Nothing relieves the symptoms. He has tried nothing for the symptoms.    Past Medical History:  Diagnosis Date  . Depression   . Diabetes mellitus    type II 2006  . Hepatitis B infection    hx of hepatitis infection  . HIV infection ([place])   . Hyperlipidemia   . Hypertension   . Syphilis     Patient Active Problem List   Diagnosis Date Noted  . S/P left knee surgery 09/03/2015  . CKD (chronic kidney disease) stage 3, GFR 30-59 ml/min 03/07/2015  . Adult acne 02/17/2015  . 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 Syphilis 06/08/2013  . Health care maintenance 10/01/2011  . Human immunodeficiency virus (HIV) disease ([place]) 02/24/2006  . Hyperlipidemia 02/24/2006  . Essential hypertension 02/24/2006    History reviewed. No pertinent surgical history.     Home Medications    Prior to Admission medications   Medication Sig Start Date End Date Taking? Authorizing Provider  amLODipine (NORVASC) 10 MG  tablet Take 1 tablet (10 mg total) by mouth daily. 05/05/16  Yes Milagros Loll, MD  hydrochlorothiazide (HYDRODIURIL) 25 MG tablet TAKE 1 TABLET (25 MG TOTAL) BY MOUTH DAILY. 07/16/16  Yes Rivet, Sindy Guadeloupe, MD  Insulin Glargine (BASAGLAR KWIKPEN) 100 UNIT/ML SOPN Inject 0.8 mLs (80 Units total) into the skin daily at 10 pm. 05/06/16  Yes Milagros Loll, MD  Insulin Pen Needle (B-D UF III MINI PEN NEEDLES) 31G X 5 MM MISC Use to inject Lantus once daily 09/03/15  Yes Rivet, Sindy Guadeloupe, MD  metFORMIN (GLUCOPHAGE) 1000 MG tablet Take 0.5 tablets (500 mg total) by mouth 2 (two) times daily with a meal. 05/06/16 05/06/17 Yes Milagros Loll, MD  metoprolol succinate (TOPROL-XL) 50 MG 24 hr tablet Take 1 tablet (50 mg total) by mouth daily. 06/26/16  Yes Rivet, Sindy Guadeloupe, MD  [facility] DELICA LANCETS MISC Use to check blood sugar up to 3 times daily Dx code: 250.00 09/06/10  Yes Rosalia Hammers, MD  Treasure 600-50-300 MG tablet TAKE ONE TABLET BY MOUTH ONCE DAILY. STORE IN ORIGINAL BOTTLE AT [facility]. 01/04/16  Yes Campbell Riches, MD  glucose blood test strip 1 each by Other route 2 (two) times daily. One touch verio iq.  And lancets 2/day 09/03/15   Rivet, Sindy Guadeloupe, MD  ondansetron (ZOFRAN) 4 MG tablet Take 1 tablet (4 mg total) by mouth every 6 (six) hours. 07/22/16   Lennice Sites, DO  Family History Family History  Problem Relation Age of Onset  . Coronary artery disease Maternal Grandfather   . Kidney disease Mother   . Diabetes Mother   . Heart disease Mother   . Kidney disease Father   . Diabetes Maternal Aunt   . Diabetes Maternal Uncle   . Diabetes Maternal Grandmother     Social History Social History  Substance Use Topics  . Smoking status: Never Smoker  . Smokeless tobacco: Never Used  . Alcohol use No     Allergies   Patient has no known allergies.   Review of Systems Review of Systems  Constitutional: Negative for chills and fever.  HENT: Negative for ear pain and  sore throat.   Eyes: Negative for pain and visual disturbance.  Respiratory: Negative for cough and shortness of breath.   Cardiovascular: Negative for chest pain and palpitations.  Gastrointestinal: Positive for diarrhea, nausea and vomiting. Negative for abdominal distention and abdominal pain.  Genitourinary: Negative for dysuria and hematuria.  Musculoskeletal: Negative for arthralgias and back pain.  Skin: Negative for color change and rash.  Neurological: Negative for seizures, syncope and headaches.  All other systems reviewed and are negative.    Physical Exam Updated Vital Signs ED Triage Vitals  Enc Vitals Group     BP 07/22/16 0955 (!) 169/93     Pulse Rate 07/22/16 0955 (!) 105     Resp 07/22/16 0955 16     Temp 07/22/16 0955 98.1 F (36.7 C)     Temp Source 07/22/16 0955 Oral     SpO2 07/22/16 0955 98 %     Weight 07/22/16 1005 220 lb (99.8 kg)     Height 07/22/16 1005 6' (1.829 m)     Head Circumference --      Peak Flow --      Pain Score --      Pain Loc --      Pain Edu? --      Excl. in [place]? --     Physical Exam  Constitutional: He is oriented to person, place, and time. He appears well-developed and well-nourished.  HENT:  Head: Normocephalic and atraumatic.  Dry mucous membranes  Eyes: Conjunctivae are normal. Pupils are equal, round, and reactive to light.  Neck: Normal range of motion. Neck supple.  Cardiovascular: Normal rate, regular rhythm, normal heart sounds and intact distal pulses.   No murmur heard. Pulmonary/Chest: Effort normal and breath sounds normal. No respiratory distress.  Abdominal: Soft. There is no tenderness.  Musculoskeletal: Normal range of motion. He exhibits no edema.  Neurological: He is alert and oriented to person, place, and time.  Skin: Skin is warm and dry. Capillary refill takes less than 2 seconds.  Psychiatric: He has a normal mood and affect.  Nursing note and vitals reviewed.    ED Treatments / Results   Labs (all labs ordered are listed, but only abnormal results are displayed) Labs Reviewed  COMPREHENSIVE METABOLIC PANEL - Abnormal; Notable for the following:       Result Value   Potassium 3.4 (*)    Glucose, Bld 148 (*)    BUN 34 (*)    Creatinine, Ser 2.78 (*)    Calcium 8.5 (*)    Total Protein 8.6 (*)    Albumin 2.7 (*)    AST 205 (*)    ALT 178 (*)    GFR calc non Af Amer 26 (*)    GFR calc Af Amer 30 (*)  All other components within normal limits  CBC - Abnormal; Notable for the following:    Hemoglobin 12.2 (*)    HCT 38.3 (*)    MCH 24.8 (*)    All other components within normal limits  URINALYSIS, ROUTINE W REFLEX MICROSCOPIC - Abnormal; Notable for the following:    APPearance HAZY (*)    Glucose, UA 50 (*)    Hgb urine dipstick MODERATE (*)    Protein, ur >=300 (*)    Bacteria, UA RARE (*)    All other components within normal limits  CBG MONITORING, ED - Abnormal; Notable for the following:    Glucose-Capillary 126 (*)    All other components within normal limits  LIPASE, BLOOD  T-HELPER CELLS (CD4) COUNT (NOT AT [facility])  HIV 1 RNA QUANT-NO REFLEX-BLD    EKG  EKG Interpretation None       Radiology No results found.  Procedures Procedures (including critical care time)  Medications Ordered in ED Medications  ondansetron (ZOFRAN-ODT) disintegrating tablet 4 mg (4 mg Oral Given 07/22/16 1010)  sodium chloride 0.9 % bolus 1,000 mL (0 mLs Intravenous Stopped 07/22/16 1502)     Initial Impression / Assessment and Plan / ED Course  I have reviewed the triage vital signs and the nursing notes.  Pertinent labs & imaging results that were available during my care of the patient were reviewed by me and considered in my medical decision making (see chart for details).     LEONARDO MAKRIS is a 46 year old male with history of HIV with last CD4 count at 550, hypertension, diabetes, CKD who presents to the ED with vomiting, diarrhea,.  Patient's vitals  at time of arrival to the ED are significant for hypertension and tachycardia but otherwise are unremarkable and patient is without fever.  Patient with vitals improved upon arrival of my evaluation.  Patient with tachycardia resolved.  Patient states that over the last 2 days he has had, vomiting, diarrhea.  Denies abdominal pain.  Patient feels overall weak.  He denies any fevers, chills, blood in his stool or emesis.  Patient has no recent travel, recent antibiotic use, recent suspicious food use.  Exam is overall unremarkable except for some dry mucous membranes suggestive of dehydration.  Abdomen is soft and nontender.  Patient does have history of CKD as well.  To evaluate will get CBC, CMP, lipase, urinalysis and will send for new CD4 count and HIV quant.  Patient likely with mild dehydration from likely viral gastroenteritis.  Will give normal saline bolus and Zofran. No abdominal pain and exam unremarkable doubt appendicitis, pancreatitis.   Patient with mild elevation in creatinine from baseline from 2.4 to 2.78.  Patient with mildly elevated liver enzymes from prior.  Otherwise labs are unremarkable. Lipase normal and doubt pancreatitis.  Labs are consistent with dehydration.  Patient feels improved following hydration and Zofran.  Contacted patient's infectious disease doctor who recommended no further workup.  Told patient to increase oral hydration and have close follow-up with primary care provider or infectious disease to repeat blood work.  Patient given prescription for Zofran and discharged from the ED in good condition. Return precautions given.  Final Clinical Impressions(s) / ED Diagnoses   Final diagnoses:  Nausea vomiting and diarrhea    New Prescriptions Discharge Medication List as of 07/22/2016  3:36 PM    START taking these medications   Details  ondansetron (ZOFRAN) 4 MG tablet Take 1 tablet (4 mg total) by mouth every  6 (six) hours., Starting Tue 07/22/2016, Print          [place], [place], DO 07/22/16 2147    Pattricia Boss, MD 07/26/16 [id number]

## 2016-07-22 NOTE — ED Triage Notes (Signed)
Pt. Developed 2 days ago n/v/d.  Pt. Denies any pain at this time.  Pt.  Is feeling very weak and drained.   Skin is warm and dry.  Pt. Denies any chills or fevers.  Pt. Denies any blood in his stool or emesis.

## 2016-07-22 NOTE — Discharge Instructions (Signed)
Follow up with primary care doctor to recheck kidney function and liver function. Please increase oral hydration.

## 2016-07-23 LAB — HIV-1 RNA QUANT-NO REFLEX-BLD
HIV 1 RNA Quant: 160 copies/mL
LOG10 HIV-1 RNA: 2.204 {Log_copies}/mL

## 2016-07-23 LAB — T-HELPER CELLS (CD4) COUNT (NOT AT ARMC)
CD4 % Helper T Cell: 20 % — ABNORMAL LOW (ref 33–55)
CD4 T Cell Abs: 230 /uL — ABNORMAL LOW (ref 400–2700)

## 2016-08-19 ENCOUNTER — Other Ambulatory Visit: Payer: Self-pay | Admitting: *Deleted

## 2016-08-19 DIAGNOSIS — E0829 Diabetes mellitus due to underlying condition with other diabetic kidney complication: Secondary | ICD-10-CM

## 2016-08-19 DIAGNOSIS — Z794 Long term (current) use of insulin: Principal | ICD-10-CM

## 2016-08-19 DIAGNOSIS — R809 Proteinuria, unspecified: Principal | ICD-10-CM

## 2016-08-19 MED ORDER — INSULIN PEN NEEDLE 31G X 5 MM MISC: 3 refills | Status: DC

## 2016-08-19 NOTE — Telephone Encounter (Signed)
Way overdue for appt. Pls sch appt DM and HTN in June PCP or [facility]. Thanks

## 2016-08-26 ENCOUNTER — Encounter: Payer: Self-pay | Admitting: *Deleted

## 2016-08-27 ENCOUNTER — Encounter (HOSPITAL_COMMUNITY): Payer: Self-pay | Admitting: Family Medicine

## 2016-08-27 ENCOUNTER — Ambulatory Visit (HOSPITAL_COMMUNITY)
Admission: EM | Admit: 2016-08-27 | Discharge: 2016-08-27 | Disposition: A | Discharge status: Home / Self Care | Payer: BC Managed Care – PPO | Attending: Family Medicine | Admitting: Family Medicine

## 2016-08-27 DIAGNOSIS — E785 Hyperlipidemia, unspecified: Secondary | ICD-10-CM | POA: Diagnosis not present

## 2016-08-27 DIAGNOSIS — E860 Dehydration: Secondary | ICD-10-CM | POA: Diagnosis not present

## 2016-08-27 DIAGNOSIS — Z79899 Other long term (current) drug therapy: Secondary | ICD-10-CM | POA: Diagnosis not present

## 2016-08-27 DIAGNOSIS — F329 Major depressive disorder, single episode, unspecified: Secondary | ICD-10-CM | POA: Diagnosis not present

## 2016-08-27 DIAGNOSIS — Z8249 Family history of ischemic heart disease and other diseases of the circulatory system: Secondary | ICD-10-CM | POA: Insufficient documentation

## 2016-08-27 DIAGNOSIS — I10 Essential (primary) hypertension: Secondary | ICD-10-CM | POA: Insufficient documentation

## 2016-08-27 DIAGNOSIS — R197 Diarrhea, unspecified: Secondary | ICD-10-CM | POA: Insufficient documentation

## 2016-08-27 DIAGNOSIS — R112 Nausea with vomiting, unspecified: Secondary | ICD-10-CM

## 2016-08-27 DIAGNOSIS — E162 Hypoglycemia, unspecified: Secondary | ICD-10-CM | POA: Diagnosis present

## 2016-08-27 DIAGNOSIS — Z833 Family history of diabetes mellitus: Secondary | ICD-10-CM | POA: Insufficient documentation

## 2016-08-27 DIAGNOSIS — E119 Type 2 diabetes mellitus without complications: Secondary | ICD-10-CM | POA: Insufficient documentation

## 2016-08-27 DIAGNOSIS — R1013 Epigastric pain: Secondary | ICD-10-CM | POA: Diagnosis not present

## 2016-08-27 DIAGNOSIS — Z794 Long term (current) use of insulin: Secondary | ICD-10-CM | POA: Insufficient documentation

## 2016-08-27 LAB — CBC WITH DIFFERENTIAL/PLATELET
BASOS ABS: 0 10*3/uL (ref 0.0–0.1)
BASOS PCT: 0 %
Eosinophils Absolute: 0.1 10*3/uL (ref 0.0–0.7)
Eosinophils Relative: 1 %
HEMATOCRIT: 36.3 % — AB (ref 39.0–52.0)
HEMOGLOBIN: 11.7 g/dL — AB (ref 13.0–17.0)
LYMPHS PCT: 23 %
Lymphs Abs: 2.9 10*3/uL (ref 0.7–4.0)
MCH: 25 pg — ABNORMAL LOW (ref 26.0–34.0)
MCHC: 32.2 g/dL (ref 30.0–36.0)
MCV: 77.6 fL — AB (ref 78.0–100.0)
MONO ABS: 0.8 10*3/uL (ref 0.1–1.0)
Monocytes Relative: 6 %
NEUTROS ABS: 8.8 10*3/uL — AB (ref 1.7–7.7)
NEUTROS PCT: 70 %
Platelets: 340 10*3/uL (ref 150–400)
RBC: 4.68 MIL/uL (ref 4.22–5.81)
RDW: 14.8 % (ref 11.5–15.5)
WBC: 12.6 10*3/uL — ABNORMAL HIGH (ref 4.0–10.5)

## 2016-08-27 LAB — POCT I-STAT, CHEM 8
BUN: 26 mg/dL — AB (ref 6–20)
CREATININE: 2.9 mg/dL — AB (ref 0.61–1.24)
Calcium, Ion: 1.16 mmol/L (ref 1.15–1.40)
Chloride: 105 mmol/L (ref 101–111)
Glucose, Bld: 48 mg/dL — ABNORMAL LOW (ref 65–99)
HEMATOCRIT: 37 % — AB (ref 39.0–52.0)
HEMOGLOBIN: 12.6 g/dL — AB (ref 13.0–17.0)
POTASSIUM: 3.1 mmol/L — AB (ref 3.5–5.1)
SODIUM: 143 mmol/L (ref 135–145)
TCO2: 26 mmol/L (ref 0–100)

## 2016-08-27 LAB — GLUCOSE, CAPILLARY
GLUCOSE-CAPILLARY: 55 mg/dL — AB (ref 65–99)
GLUCOSE-CAPILLARY: 115 mg/dL — AB (ref 65–99)
Glucose-Capillary: 45 mg/dL — ABNORMAL LOW (ref 65–99)

## 2016-08-27 LAB — POCT URINALYSIS DIP (DEVICE)
BILIRUBIN URINE: NEGATIVE
Glucose, UA: NEGATIVE mg/dL
Ketones, ur: NEGATIVE mg/dL
Leukocytes, UA: NEGATIVE
NITRITE: NEGATIVE
Protein, ur: >=300 mg/dL — AB
Specific Gravity, Urine: >=1.03 (ref 1.005–1.030)
UROBILINOGEN UA: 0.2 mg/dL (ref 0.0–1.0)
pH: 5.5 (ref 5.0–8.0)

## 2016-08-27 MED ORDER — DEXTROSE 50 % IV SOLN
INTRAVENOUS | Status: AC
  Filled 2016-08-27: qty 50

## 2016-08-27 MED ORDER — ONDANSETRON 4 MG PO TBDP: 4.0000 mg | ORAL_TABLET | Freq: Three times a day (TID) | ORAL | 0 refills | Status: DC | PRN

## 2016-08-27 MED ORDER — DEXTROSE 50 % IV SOLN
12.5000 g | Freq: Once | INTRAVENOUS | Status: AC
  Administered 2016-08-27: 12.5 g via INTRAVENOUS

## 2016-08-27 MED ORDER — ONDANSETRON HCL 4 MG/2ML IJ SOLN
INTRAMUSCULAR | Status: AC
  Filled 2016-08-27: qty 2

## 2016-08-27 MED ORDER — SODIUM CHLORIDE 0.9 % IV BOLUS (SEPSIS)
1000.0000 mL | Freq: Once | INTRAVENOUS | Status: AC
  Administered 2016-08-27: 1000 mL via INTRAVENOUS

## 2016-08-27 MED ORDER — ONDANSETRON HCL 4 MG/2ML IJ SOLN
4.0000 mg | Freq: Once | INTRAMUSCULAR | Status: AC
  Administered 2016-08-27: 4 mg via INTRAVENOUS

## 2016-08-27 NOTE — ED Triage Notes (Signed)
Pt here for abd pain since Monday. sts vomiting Monday. sts he hasn't had his BP meds or Diabetes meds in 3 days.

## 2016-08-27 NOTE — Discharge Instructions (Signed)
I have discontinued your metformin at this time due to your kidney function. I recommend you schedule an appointment with your internist in 1-2 weeks for reevaluation. Drink lots of water to prevent dehydration, and also to help with your kidneys. If you continue to take your insulin, be sure to eat. For Nausea, I have prescribed Zofran, take 1 tablet under the tongue every 8 hours as needed. He may continue your blood pressure medicine as prescribed, and I would encourage this as your blood pressure is quite elevated here. If your symptoms at anytime worsen, return to clinic, or go to the emergency room.

## 2016-08-27 NOTE — ED Notes (Addendum)
Pt given a cup of OJ for the low blood sugar.

## 2016-08-27 NOTE — ED Notes (Signed)
Patient's IV removed. Intact upon removal.

## 2016-08-27 NOTE — ED Notes (Signed)
Pt given some G2 Gatorade and a doughnut for blood sugar.

## 2016-08-27 NOTE — ED Provider Notes (Signed)
CSN: [id number]     Arrival date & time 08/27/16  1007 History   First MD Initiated Contact with Patient 08/27/16 1057     Chief Complaint  Patient presents with  . Abdominal Pain   (Consider location/radiation/quality/duration/timing/severity/associated sxs/prior Treatment) 46 year old male presents to clinic with a three-day history of abdominal pain and cramping. States he had something similar to this a few months ago, when he was dehydrated and he was at diagnosed at that time with acute kidney injury. States he has not been drinking much water lately, and he stopped taking his diabetic medicines. He's had no appetite, has not eaten, states that is due to lack of desire to eat, he has continued to take his insulin dose. His complaining of nausea, vomiting, and epigastric and abdominal pain. No chest pain, shortness of breath, swelling in his hands, feet, ankles, fever, chills, or other complaints. His have a past medical history of HIV, diabetes, hepatitis B, and syphilis. He is followed by infectious disease for his HIV. Family, social histories reviewed, family history significant for diabetes and hypertension, otherwise noncontributory.   The history is provided by the patient.  Abdominal Pain  Pain location:  Epigastric Associated symptoms: fatigue   Associated symptoms: no diarrhea, no fever, no nausea and no vomiting     Past Medical History:  Diagnosis Date  . Depression   . Diabetes mellitus    type II 2006  . Hepatitis B infection    hx of hepatitis infection  . HIV infection ([place])   . Hyperlipidemia   . Hypertension   . Syphilis    History reviewed. No pertinent surgical history. Family History  Problem Relation Age of Onset  . Coronary artery disease Maternal Grandfather   . Kidney disease Mother   . Diabetes Mother   . Heart disease Mother   . Kidney disease Father   . Diabetes Maternal Aunt   . Diabetes Maternal Uncle   . Diabetes Maternal Grandmother     Social History  Substance Use Topics  . Smoking status: Never Smoker  . Smokeless tobacco: Never Used  . Alcohol use No    Review of Systems  Constitutional: Positive for activity change, appetite change and fatigue. Negative for fever.  HENT: Negative.   Respiratory: Negative.   Cardiovascular: Negative.   Gastrointestinal: Positive for abdominal pain. Negative for diarrhea, nausea and vomiting.    Allergies  Patient has no known allergies.  Home Medications   Prior to Admission medications   Medication Sig Start Date End Date Taking? Authorizing Provider  amLODipine (NORVASC) 10 MG tablet Take 1 tablet (10 mg total) by mouth daily. 05/05/16   Milagros Loll, MD  glucose blood test strip 1 each by Other route 2 (two) times daily. One touch verio iq.  And lancets 2/day 09/03/15   Rivet, Sindy Guadeloupe, MD  hydrochlorothiazide (HYDRODIURIL) 25 MG tablet TAKE 1 TABLET (25 MG TOTAL) BY MOUTH DAILY. 07/16/16   Rivet, Sindy Guadeloupe, MD  Insulin Glargine (BASAGLAR KWIKPEN) 100 UNIT/ML SOPN Inject 0.8 mLs (80 Units total) into the skin daily at 10 pm. 05/06/16   Milagros Loll, MD  Insulin Pen Needle (B-D UF III MINI PEN NEEDLES) 31G X 5 MM MISC Use to inject Lantus once daily 08/19/16   Bartholomew Crews, MD  metoprolol succinate (TOPROL-XL) 50 MG 24 hr tablet Take 1 tablet (50 mg total) by mouth daily. 06/26/16   Rivet, Sindy Guadeloupe, MD  ondansetron (ZOFRAN ODT) 4 MG disintegrating  tablet Take 1 tablet (4 mg total) by mouth every 8 (eight) hours as needed for nausea or vomiting. 08/27/16   Barnet Glasgow, NP  ondansetron (ZOFRAN) 4 MG tablet Take 1 tablet (4 mg total) by mouth every 6 (six) hours. 07/22/16   Curatolo, Adam, DO  ONETOUCH DELICA LANCETS MISC Use to check blood sugar up to 3 times daily Dx code: 250.00 09/06/10   Rosalia Hammers, MD  [place] 600-50-300 MG tablet TAKE ONE TABLET BY MOUTH ONCE DAILY. STORE IN ORIGINAL BOTTLE AT [facility]. 01/04/16   Campbell Riches, MD   Meds  Ordered and Administered this Visit   Medications  sodium chloride 0.9 % bolus 1,000 mL (0 mLs Intravenous Stopped 08/27/16 1310)  ondansetron (ZOFRAN) injection 4 mg (4 mg Intravenous Given 08/27/16 1132)  dextrose 50 % solution 12.5 g (12.5 g Intravenous Given 08/27/16 1310)    BP (!) 182/86   Pulse 91   Temp 98 F (36.7 C)   Resp 18   SpO2 100%  No data found.   Physical Exam  Constitutional: He is oriented to person, place, and time. He appears well-developed and well-nourished. No distress.  HENT:  Head: Normocephalic and atraumatic.  Right Ear: External ear normal.  Left Ear: External ear normal.  Nose: Nose normal.  Mouth/Throat: Oropharynx is clear and moist.  Eyes: Conjunctivae are normal.  Neck: Normal range of motion.  Cardiovascular: Normal rate and regular rhythm.   Pulmonary/Chest: Effort normal and breath sounds normal.  Abdominal: Soft. Bowel sounds are normal. He exhibits no distension. There is no tenderness.  Lymphadenopathy:    He has no cervical adenopathy.  Neurological: He is alert and oriented to person, place, and time.  Skin: Skin is warm and dry. Capillary refill takes less than 2 seconds. He is not diaphoretic.  Psychiatric: He has a normal mood and affect. His behavior is normal.  Nursing note and vitals reviewed.   Urgent Care Course     Procedures (including critical care time)  Labs Review Labs Reviewed  CBC WITH DIFFERENTIAL/PLATELET - Abnormal; Notable for the following:       Result Value   WBC 12.6 (*)    Hemoglobin 11.7 (*)    HCT 36.3 (*)    MCV 77.6 (*)    MCH 25.0 (*)    Neutro Abs 8.8 (*)    All other components within normal limits  GLUCOSE, CAPILLARY - Abnormal; Notable for the following:    Glucose-Capillary 45 (*)    All other components within normal limits  GLUCOSE, CAPILLARY - Abnormal; Notable for the following:    Glucose-Capillary 55 (*)    All other components within normal limits  GLUCOSE, CAPILLARY -  Abnormal; Notable for the following:    Glucose-Capillary 115 (*)    All other components within normal limits  POCT URINALYSIS DIP (DEVICE) - Abnormal; Notable for the following:    Hgb urine dipstick LARGE (*)    Protein, ur >=300 (*)    All other components within normal limits  POCT I-STAT, CHEM 8 - Abnormal; Notable for the following:    Potassium 3.1 (*)    BUN 26 (*)    Creatinine, Ser 2.90 (*)    Glucose, Bld 48 (*)    Hemoglobin 12.6 (*)    HCT 37.0 (*)    All other components within normal limits    Imaging Review No results found.      MDM   1. Hypoglycemia   2. Nausea  vomiting and diarrhea   3. Dehydration     Attempt made to reduce patient's blood glucose to oral means with sugary fluids, and a snack, however this failed, half amp of D50. Blood sugar corrected to 115.  Nausea, vomiting, diarrhea, patient felt that he felt much better after Zofran, given 1 L of fluid.  Dehydration, patient given 1 L of fluid, reports improvement in his symptoms.  Creatinine elevated at 2.9, along with elevated BUN, along with other laboratory abnormalities discontinued the patient's metformin, encouraged him to continue the use of his blood pressure medicine, and to schedule appointment with his primary care provider for reevaluation in 1-2 weeks and repeat laboratory work. Counseling provided for follow-up care, encouraged to go to the ER at any time symptoms return or worsen    Barnet Glasgow, NP 08/27/16 1921

## 2016-09-01 ENCOUNTER — Ambulatory Visit (INDEPENDENT_AMBULATORY_CARE_PROVIDER_SITE_OTHER): Payer: BC Managed Care – PPO | Admitting: Internal Medicine

## 2016-09-01 ENCOUNTER — Encounter: Payer: Self-pay | Admitting: Internal Medicine

## 2016-09-01 VITALS — BP 175/100 | HR 66 | Temp 98.2°F | Ht 72.0 in | Wt 218.4 lb

## 2016-09-01 DIAGNOSIS — E1122 Type 2 diabetes mellitus with diabetic chronic kidney disease: Secondary | ICD-10-CM | POA: Diagnosis not present

## 2016-09-01 DIAGNOSIS — Z79899 Other long term (current) drug therapy: Secondary | ICD-10-CM

## 2016-09-01 DIAGNOSIS — N183 Chronic kidney disease, stage 3 unspecified: Secondary | ICD-10-CM

## 2016-09-01 DIAGNOSIS — Z794 Long term (current) use of insulin: Secondary | ICD-10-CM

## 2016-09-01 DIAGNOSIS — R3129 Other microscopic hematuria: Secondary | ICD-10-CM

## 2016-09-01 DIAGNOSIS — E0829 Diabetes mellitus due to underlying condition with other diabetic kidney complication: Secondary | ICD-10-CM

## 2016-09-01 DIAGNOSIS — I10 Essential (primary) hypertension: Secondary | ICD-10-CM

## 2016-09-01 DIAGNOSIS — Z5189 Encounter for other specified aftercare: Secondary | ICD-10-CM

## 2016-09-01 DIAGNOSIS — R809 Proteinuria, unspecified: Principal | ICD-10-CM

## 2016-09-01 DIAGNOSIS — Z21 Asymptomatic human immunodeficiency virus [HIV] infection status: Secondary | ICD-10-CM

## 2016-09-01 DIAGNOSIS — I129 Hypertensive chronic kidney disease with stage 1 through stage 4 chronic kidney disease, or unspecified chronic kidney disease: Secondary | ICD-10-CM | POA: Diagnosis not present

## 2016-09-01 DIAGNOSIS — B20 Human immunodeficiency virus [HIV] disease: Secondary | ICD-10-CM

## 2016-09-01 LAB — POCT URINALYSIS DIPSTICK
BILIRUBIN UA: NEGATIVE
Glucose, UA: NEGATIVE
Ketones, UA: NEGATIVE
LEUKOCYTES UA: NEGATIVE
Nitrite, UA: NEGATIVE
PH UA: 5.5 (ref 5.0–8.0)
Protein, UA: >=300
Spec Grav, UA: 1.025 (ref 1.010–1.025)
Urobilinogen, UA: 0.2 E.U./dL

## 2016-09-01 LAB — POCT GLYCOSYLATED HEMOGLOBIN (HGB A1C): HEMOGLOBIN A1C: 6.8

## 2016-09-01 LAB — GLUCOSE, CAPILLARY: Glucose-Capillary: 110 mg/dL — ABNORMAL HIGH (ref 65–99)

## 2016-09-01 MED ORDER — AMLODIPINE BESYLATE 10 MG PO TABS: 10.0000 mg | ORAL_TABLET | Freq: Every day | ORAL | 3 refills | Status: DC

## 2016-09-01 MED ORDER — HYDROCHLOROTHIAZIDE 25 MG PO TABS: 25.0000 mg | ORAL_TABLET | Freq: Every day | ORAL | 3 refills | Status: DC

## 2016-09-01 MED ORDER — METOPROLOL SUCCINATE ER 50 MG PO TB24: 50.0000 mg | ORAL_TABLET | Freq: Every day | ORAL | 2 refills | Status: DC

## 2016-09-01 NOTE — Assessment & Plan Note (Addendum)
BP Readings from Last 3 Encounters:  09/01/16 (!) 175/100  08/27/16 (!) 182/86  07/22/16 (!) 160/90   His blood pressure was elevated. He ran out of his amlodipine and hydrochlorothiazide for about 10 days. He denies any symptoms of hypertensive urgency or emergency. There are some compliance issues.  Addendum. Consider stopping HCTZ as it is not that effected at GFR of 30. We can start him on hydralazine during next follow-up visit.  Patient was counseled to take his medications regularly, and follow up with clinic if he needs any refills.  I discontinued metoprolol, as it can mask his hypoglycemic symptoms and he does not has any diagnoses of heart failure or CAD.  -Gave him a prescription for amlodipine and hydrochlorothiazide. -Repeat BMP, if his creatinine continued to rise and GFR below 30, we will discontinue hydrochlorothiazide and replace it with hydralazine.

## 2016-09-01 NOTE — Assessment & Plan Note (Signed)
His A1c today was 6.8, dropped from 8.2 during previous office visit in February 2018. During the ED visit he was found to be hypoglycemic with blood sugar of 45, he was having nausea vomiting and diarrhea, but denies any hypoglycemic symptoms.  During his ED visit he was told to stop metformin because of worsening renal function and also told to cut his insulin glargine in half from 80 units to 40 units daily, he is using 40 units in the morning daily currently.  He does not like to check his blood sugar at home, we talked about continuous glucose monitoring and patient was very interested.  -Make an ASAP appointment with [name] for CGM placement. -Continue insulin glargine 40 units daily. -Follow up in one week with CGM readings for further evaluation and management.

## 2016-09-01 NOTE — Assessment & Plan Note (Addendum)
He is having worsening creatinine. It can be multifactorial because of his diabetes, hypertension and HIV.  -Repeat BMP. -Repeat urinary protein/creatinine ratio. -Referral to nephrology.  Addendum. Continued to have worsening renal functions with GFR of 30. We will stop HCTZ during next follow-up visit, and can start hydralazine for blood pressure control.

## 2016-09-01 NOTE — Assessment & Plan Note (Addendum)
He is having microscopic painless hematuria on 2 previous urinary exams. Today his dipstick was again positive for blood. He is a nonsmoker. He works as a Pharmacist, hospital.  -We will check UA with reflex microscopy. -He was already given a referral for nephrology because of worsening renal functions. -If microscopy does not show any cast, he might also need a referral for urology.  Addendum. Patient continued to have microscopic hematuria, there was no past but they do not spend the urine to see any RBC abnormalities. I called the patient and he will come tomorrow morning to give [place] a new urine sample, to see any sedimentation. We will also try to expedite his nephrology appointment.

## 2016-09-01 NOTE — Patient Instructions (Addendum)
Thank you for visiting clinic today. I am stopping your metoprolol, as it can mask your symptoms of low blood sugar. Please take your hydrochlorothiazide and amlodipine as directed and regularly. As we discussed regarding continuous blood glucose monitoring device, you will make an appointment with [name] as soon as possible to have the device placed. Please follow-up with [place] in 1 week regarding your blood pressure. We are also giving you a referral for a kidney doctor.

## 2016-09-01 NOTE — Assessment & Plan Note (Signed)
He follow-up with ID, his last CD count was 230 in May 2018. According to patient he is compliance with his medication.  -Continue follow-up with ID clinic.

## 2016-09-01 NOTE — Progress Notes (Signed)
   CC: Emergency room follow-up, had nausea vomiting and diarrhea last week.  HPI:  [name] is a 46 y.o. with past medical history as listed below came to the clinic for his emergency room follow-up.  He was seen in ED on 08/27/2016 because of nausea, vomiting and diarrhea. There are no precipitating factors and no travel history. He was also found to have hypoglycemia with blood sugar of 45 during that visit.He was treated symptomatically and with IV fluid. His nausea and vomiting has been dissolved, diarrhea is improving, still having 2-3 loose BMs daily, still get occasional abdominal cramps.according to patient his symptoms are progressively improving every day.  His blood pressure was high, he ran out of his antihypertensives approximately 10 days ago.according to patient he still have his metoprolol.he was given 1 month worth of metoprolol in April 2018.,most likely he is not compliance with his medications. He denies any headache, do endorse some blurry vision but it is chronic and has an eye appointment on 09/13/2016.he denies any chest pain, exertional dyspnea, orthopnea or PND.  He does not check his blood sugar at home. He denies any hypoglycemic symptoms, his insulin was decreased from 80 units daily to 40 units daily during his ED visit because of his hypoglycemia,he was also asked to stop his metformin because of worsening renal functions during that visit.  He denies any gross hematuria, dysuria or increased in frequency of his urination.  Past Medical History:  Diagnosis Date  . Depression   . Diabetes mellitus    type II 2006  . Hepatitis B infection    hx of hepatitis infection  . HIV infection ([name] [name])   . Hyperlipidemia   . Hypertension   . Syphilis     Review of Systems:  As per HPI.  Physical Exam:  Vitals:   09/01/16 1331  BP: (!) 175/100  Pulse: 66  Temp: 98.2 F (36.8 C)  TempSrc: Oral  SpO2: 100%  Weight: 218 lb 6.4 oz (99.1 kg)  Height:  6' (1.829 m)    General: Vital signs reviewed.  Patient is well-developed and well-nourished, in no acute distress and cooperative with exam.  Cardiovascular: RRR, S1 normal, S2 normal, no murmurs, gallops, or rubs. Pulmonary/Chest: Clear to auscultation bilaterally, no wheezes, rales, or rhonchi. Abdominal: Soft, non-tender, non-distended, BS +, no masses, organomegaly, or guarding present.  Musculoskeletal: No joint deformities, erythema, or stiffness, ROM full and nontender. Extremities: No lower extremity edema bilaterally,  pulses symmetric and intact bilaterally. No cyanosis or clubbing. Skin: Warm, dry and intact. No rashes or erythema. Psychiatric: Normal mood and affect. speech and behavior is normal. Cognition and memory are normal.  Assessment & Plan:   See Encounters Tab for problem based charting.  Patient discussed with Dr. Lynnae January.

## 2016-09-02 ENCOUNTER — Other Ambulatory Visit: Payer: Self-pay | Admitting: Internal Medicine

## 2016-09-02 ENCOUNTER — Encounter: Payer: Self-pay | Admitting: Dietician

## 2016-09-02 ENCOUNTER — Ambulatory Visit (INDEPENDENT_AMBULATORY_CARE_PROVIDER_SITE_OTHER): Payer: BC Managed Care – PPO | Admitting: Dietician

## 2016-09-02 DIAGNOSIS — Z794 Long term (current) use of insulin: Secondary | ICD-10-CM

## 2016-09-02 DIAGNOSIS — E119 Type 2 diabetes mellitus without complications: Secondary | ICD-10-CM

## 2016-09-02 DIAGNOSIS — E0829 Diabetes mellitus due to underlying condition with other diabetic kidney complication: Secondary | ICD-10-CM

## 2016-09-02 DIAGNOSIS — R809 Proteinuria, unspecified: Principal | ICD-10-CM

## 2016-09-02 DIAGNOSIS — R3129 Other microscopic hematuria: Secondary | ICD-10-CM

## 2016-09-02 LAB — MICROSCOPIC EXAMINATION: CASTS: NONE SEEN /LPF

## 2016-09-02 LAB — URINALYSIS, ROUTINE W REFLEX MICROSCOPIC
BILIRUBIN UA: NEGATIVE
Glucose, UA: NEGATIVE
Ketones, UA: NEGATIVE
LEUKOCYTES UA: NEGATIVE
Nitrite, UA: NEGATIVE
PH UA: 5 (ref 5.0–7.5)
Specific Gravity, UA: 1.02 (ref 1.005–1.030)
Urobilinogen, Ur: 0.2 mg/dL (ref 0.2–1.0)

## 2016-09-02 LAB — BMP8+ANION GAP
Anion Gap: 15 mmol/L (ref 10.0–18.0)
BUN / CREAT RATIO: 9 (ref 9–20)
BUN: 26 mg/dL — ABNORMAL HIGH (ref 6–24)
CO2: 25 mmol/L (ref 20–29)
CREATININE: 2.83 mg/dL — AB (ref 0.76–1.27)
Calcium: 8.3 mg/dL — ABNORMAL LOW (ref 8.7–10.2)
Chloride: 100 mmol/L (ref 96–106)
GFR calc Af Amer: 30 mL/min/{1.73_m2} — ABNORMAL LOW (ref >59)
GFR, EST NON AFRICAN AMERICAN: 26 mL/min/{1.73_m2} — AB (ref >59)
Glucose: 99 mg/dL (ref 65–99)
Potassium: 3.4 mmol/L — ABNORMAL LOW (ref 3.5–5.2)
SODIUM: 140 mmol/L (ref 134–144)

## 2016-09-02 LAB — MICROALBUMIN / CREATININE URINE RATIO
CREATININE, UR: 184.3 mg/dL
Microalb/Creat Ratio: 1712.7 mg/g creat — ABNORMAL HIGH (ref 0.0–30.0)
Microalbumin, Urine: 3156.5 ug/mL

## 2016-09-02 NOTE — Progress Notes (Signed)
Internal Medicine Clinic Attending  Case discussed with Dr. Reesa Chew at the time of the visit.  We reviewed the resident's history and exam and pertinent patient test results.  I agree with the assessment, diagnosis, and plan of care documented in the resident's note. I called lab - they do not spin urine and examine sediment so we do not know if he has acanthocytes or not. Will ask pt to return for urine specimen for examination or ask nephrology to spin and examine urine.

## 2016-09-02 NOTE — Patient Instructions (Signed)
Please record the time, amount and what food drinks and activities you have while wearing the continuous glucose monitor(CGM) in the folder provided.  Bring the folder with you to follow up appointments  Do not have a CT or an MRI while wearing the CGM.   Please make an appointment for 1 week with me and a doctor for the first of two CGM downloads..   You will also return in 2 weeks to have your second download and the CGM removed.  

## 2016-09-02 NOTE — Addendum Note (Signed)
Addended by: Lorella Nimrod on: 09/02/2016 06:24 PM   Modules accepted: Orders

## 2016-09-02 NOTE — Progress Notes (Signed)
Freestyle Libre Pro CGM sensor placed and started. Patient was educated about wearing sensor, keeping food, activity and medication log and when to call office. Follow up was arranged with the patient. [name] [name] verbalized understanding and agreement to participate in professional CGM. [place], [name], RD 09/02/2016 9:23 AM.

## 2016-09-03 ENCOUNTER — Other Ambulatory Visit: Payer: BC Managed Care – PPO

## 2016-09-03 DIAGNOSIS — R3129 Other microscopic hematuria: Secondary | ICD-10-CM

## 2016-09-03 LAB — URINALYSIS, ROUTINE W REFLEX MICROSCOPIC
Bilirubin Urine: NEGATIVE
Glucose, UA: 50 mg/dL — AB
Ketones, ur: NEGATIVE mg/dL
Leukocytes, UA: NEGATIVE
Nitrite: NEGATIVE
Protein, ur: >=300 mg/dL — AB
SPECIFIC GRAVITY, URINE: 1.016 (ref 1.005–1.030)
pH: 5 (ref 5.0–8.0)

## 2016-09-08 ENCOUNTER — Encounter: Payer: Self-pay | Admitting: Internal Medicine

## 2016-09-08 ENCOUNTER — Ambulatory Visit (INDEPENDENT_AMBULATORY_CARE_PROVIDER_SITE_OTHER): Payer: BC Managed Care – PPO | Admitting: Internal Medicine

## 2016-09-08 VITALS — BP 154/85 | HR 91 | Temp 98.2°F | Ht 72.0 in | Wt 218.6 lb

## 2016-09-08 DIAGNOSIS — E1122 Type 2 diabetes mellitus with diabetic chronic kidney disease: Secondary | ICD-10-CM

## 2016-09-08 DIAGNOSIS — Z794 Long term (current) use of insulin: Secondary | ICD-10-CM | POA: Diagnosis not present

## 2016-09-08 DIAGNOSIS — Z79899 Other long term (current) drug therapy: Secondary | ICD-10-CM

## 2016-09-08 DIAGNOSIS — R809 Proteinuria, unspecified: Secondary | ICD-10-CM

## 2016-09-08 DIAGNOSIS — N183 Chronic kidney disease, stage 3 (moderate): Secondary | ICD-10-CM

## 2016-09-08 DIAGNOSIS — E0829 Diabetes mellitus due to underlying condition with other diabetic kidney complication: Secondary | ICD-10-CM

## 2016-09-08 DIAGNOSIS — I129 Hypertensive chronic kidney disease with stage 1 through stage 4 chronic kidney disease, or unspecified chronic kidney disease: Secondary | ICD-10-CM | POA: Diagnosis not present

## 2016-09-08 DIAGNOSIS — I10 Essential (primary) hypertension: Secondary | ICD-10-CM

## 2016-09-08 MED ORDER — LISINOPRIL 2.5 MG PO TABS: 2.5000 mg | ORAL_TABLET | Freq: Every day | ORAL | 0 refills | Status: DC

## 2016-09-08 MED ORDER — BASAGLAR KWIKPEN 100 UNIT/ML ~~LOC~~ SOPN
35.0000 [IU] | PEN_INJECTOR | Freq: Every day | SUBCUTANEOUS | 3 refills | Status: DC
Start: 2016-09-08 — End: 2017-02-13

## 2016-09-08 NOTE — Progress Notes (Signed)
   CC: Patient is here for a follow-up of his hypertension and diabetes.  HPI:  [name] is a 46 y.o. male with a past medical history of conditions listed below presenting to the clinic for a follow-up of his hypertension and diabetes. Please see problem based charting for the status of the patient's current and chronic medical conditions.   Past Medical History:  Diagnosis Date  . Depression   . Diabetes mellitus    type II 2006  . Hepatitis B infection    hx of hepatitis infection  . HIV infection ([place])   . Hyperlipidemia   . Hypertension   . Syphilis     Review of Systems: Pertinent positives mentioned in HPI. Remainder of all ROS negative.   Physical Exam:  Vitals:   09/08/16 1056  BP: (!) 154/85  Pulse: 91  Temp: 98.2 F (36.8 C)  TempSrc: Oral  SpO2: 100%  Weight: 218 lb 9.6 oz (99.2 kg)  Height: 6' (1.829 m)   Physical Exam  Constitutional: He is oriented to person, place, and time. He appears well-developed and well-nourished. No distress.  HENT:  Head: Normocephalic and atraumatic.  Eyes: Right eye exhibits no discharge. Left eye exhibits no discharge.  Cardiovascular: Normal rate, regular rhythm and intact distal pulses.   Pulmonary/Chest: Effort normal and breath sounds normal. No respiratory distress. He has no wheezes. He has no rales.  Abdominal: Soft. Bowel sounds are normal. He exhibits no distension. There is no tenderness.  Musculoskeletal: He exhibits no edema.  Neurological: He is alert and oriented to person, place, and time.  Skin: Skin is warm and dry.    Assessment & Plan:   See Encounters Tab for problem based charting.  Patient discussed with Dr. Daryll Drown

## 2016-09-08 NOTE — Patient Instructions (Addendum)
[name] [name] it was nice meeting you today.  -STOP taking hydrochlorothiazide  -Continue taking amlodipine  -Start taking lisinopril 2.5 mg once daily  -Please make an appointment with the kidney doctors soon  -Dose of insulin glargine has been decreased: Use 35 units every morning.  -Please make an appointment with our nutritionist [name] for a visit this week  -Return for a follow-up in one week

## 2016-09-08 NOTE — Assessment & Plan Note (Signed)
Recent A1c 6.8. Metformin was discontinued previously due to his poor renal function. Current medication regimen includes insulin glargine 40 units every morning. Patient is currently using a continuous glucose monitoring system. Denies having any symptoms of hypoglycemia since his prior visit.  [place] wore the CGM for 1 week. The average reading was 126, % time in target was 73, % time below target was 12, and % time above target was 15. CBGs dropping below target between 3-6 am and between lunch and dinner time. CBGs are above target late in the evening. Intervention will be to decrease dose of insulin glargine to 35 units in the morning. Advised patient to eat 3 meals consistently and decrease his carbohydrate intake at dinnertime The patient will be scheduled to return to [facility] in one week for a final appointment. He has been advised to meet with [name] in the next 1-2 days to discuss nutrition.

## 2016-09-08 NOTE — Assessment & Plan Note (Addendum)
Assessment Patient has a history of uncontrolled hypertension secondary to medication noncompliance. Current medication regimen includes amlodipine 10 mg daily and hydrochlorothiazide 25 mg daily. He does have significant proteinuria in the setting of CKD stage III and diabetes. Recent labs showing urine microalbumin to creatinine ratio 1712 and GFR 30. Diabetes is currently well controlled; recent A1c 6.8. Beta blocker was discontinued during his prior visit as patient was found to be hypoglycemic during a prior ED visit; beta blockers can mask the effects of hypoglycemia. Blood pressure continues to be above goal at this visit (154/85).  Plan -Discontinue hydrochlorothiazide as it will likely be ineffective at his low GFR -Start ACE inhibitor (lisinopril 2.5 mg daily) to help with proteinuria and preserve his renal function -Continue amlodipine as above -Referral to nephrology was placed during his prior visit. Advised patient to make an appointment soon. -Check BMP and urine microalbumin to creatinine ratio at his next visit. Consider discontinuing amlodipine at a future visit  as it can make proteinuria worse. Instead, consider starting him on spironolactone. Hydralazine will be a poor choice due to its 3 times a day dosing making medication adherence a challenge.

## 2016-09-09 NOTE — Progress Notes (Signed)
Internal Medicine Clinic Attending  Case discussed with Dr. Rathoreat the time of the visit. We reviewed the resident's history and exam and pertinent patient test results. I agree with the assessment, diagnosis, and plan of care documented in the resident's note.  

## 2016-09-15 ENCOUNTER — Ambulatory Visit (INDEPENDENT_AMBULATORY_CARE_PROVIDER_SITE_OTHER): Payer: BC Managed Care – PPO | Admitting: Internal Medicine

## 2016-09-15 ENCOUNTER — Ambulatory Visit (INDEPENDENT_AMBULATORY_CARE_PROVIDER_SITE_OTHER): Payer: BC Managed Care – PPO | Admitting: Dietician

## 2016-09-15 VITALS — BP 184/97 | HR 81 | Temp 97.8°F | Ht 72.0 in | Wt 221.5 lb

## 2016-09-15 DIAGNOSIS — I10 Essential (primary) hypertension: Secondary | ICD-10-CM

## 2016-09-15 DIAGNOSIS — E0829 Diabetes mellitus due to underlying condition with other diabetic kidney complication: Secondary | ICD-10-CM

## 2016-09-15 DIAGNOSIS — Z21 Asymptomatic human immunodeficiency virus [HIV] infection status: Secondary | ICD-10-CM | POA: Diagnosis not present

## 2016-09-15 DIAGNOSIS — Z794 Long term (current) use of insulin: Secondary | ICD-10-CM

## 2016-09-15 DIAGNOSIS — I129 Hypertensive chronic kidney disease with stage 1 through stage 4 chronic kidney disease, or unspecified chronic kidney disease: Secondary | ICD-10-CM | POA: Diagnosis not present

## 2016-09-15 DIAGNOSIS — E118 Type 2 diabetes mellitus with unspecified complications: Secondary | ICD-10-CM | POA: Diagnosis not present

## 2016-09-15 DIAGNOSIS — Z79899 Other long term (current) drug therapy: Secondary | ICD-10-CM

## 2016-09-15 DIAGNOSIS — E1122 Type 2 diabetes mellitus with diabetic chronic kidney disease: Secondary | ICD-10-CM

## 2016-09-15 DIAGNOSIS — N183 Chronic kidney disease, stage 3 unspecified: Secondary | ICD-10-CM

## 2016-09-15 DIAGNOSIS — Z713 Dietary counseling and surveillance: Secondary | ICD-10-CM | POA: Diagnosis not present

## 2016-09-15 DIAGNOSIS — R809 Proteinuria, unspecified: Secondary | ICD-10-CM

## 2016-09-15 MED ORDER — LISINOPRIL 5 MG PO TABS: 5.0000 mg | ORAL_TABLET | Freq: Every day | ORAL | 0 refills | Status: DC

## 2016-09-15 NOTE — Progress Notes (Signed)
° °  CC: F/u HTN,   HPI:  [name] [name] is a 46 y.o.  M with hx of HTN, DM, CKD, HIV who presents for follow-up of HTN. He was seen on 6/25 when his BP was elevated to 154/85 with recent proteinuria on labs. At that time his HCTZ was dc'ed, and lisinopril 2.5 mg was started. He is also on 10 mg amlodipine. He reports he has been taking his medications as prescribed over the last week without issue. He notes blurry vision which has been a chronic and progressive problem, no acute changes. Denies HA, abd pain, decreased urine output, or other associated symptoms. He notes he has Nephrology, Ophthalmology appointments upcoming.   Also at last visit, his glargine was decreased to 35 U qam, he reports injecting as prescribed with no nausea, diaphoresis, or other sx of hypoglycemia.   Past Medical History:  Diagnosis Date   Depression    Diabetes mellitus    type II 2006   Hepatitis B infection    hx of hepatitis infection   HIV infection ([place])    Hyperlipidemia    Hypertension    Syphilis    Review of Systems:  Review of Systems  Constitutional: Negative for chills and fever.  Eyes: Positive for blurred vision (chronic).  Cardiovascular: Negative for chest pain and leg swelling.  Gastrointestinal: Negative for abdominal pain.  Genitourinary: Negative for dysuria and flank pain.  Neurological: Negative for focal weakness and headaches.    Physical Exam:  Vitals:   09/15/16 0945  BP: (!) 184/97  Pulse: 81  Temp: 97.8 F (36.6 C)  TempSrc: Oral  SpO2: 100%  Weight: 221 lb 8 oz (100.5 kg)  Height: 6' (1.829 m)   Physical Exam  Constitutional: He appears well-developed and well-nourished.  Eyes: EOM are normal. Pupils are equal, round, and reactive to light.  Chronic changes on fundoscopic exam with no evidence of acute papilledema  Cardiovascular: Normal rate and regular rhythm.   Pulmonary/Chest: Effort normal and breath sounds normal. He has no wheezes.    Abdominal: Soft. There is no tenderness.  Musculoskeletal: He exhibits no edema.    Assessment & Plan:   See Encounters Tab for problem based charting.  Patient seen with Dr. Angelia Mould

## 2016-09-15 NOTE — Progress Notes (Signed)
Internal Medicine Clinic Attending  I saw and evaluated the patient.  I personally confirmed the key portions of the history and exam documented by Dr. Harden and I reviewed pertinent patient test results.  The assessment, diagnosis, and plan were formulated together and I agree with the documentation in the resident's note.  

## 2016-09-15 NOTE — Assessment & Plan Note (Addendum)
DM Assessment:  Most recent A1c 6.8, below goal. Glargine dose changed to 35 U after last visit following review of CGM data. Updated CGM shows improved control on new dose with less variability, average glucose 118, 81% in target range. Discussed further management issues with [name] today.   Plan;  -Continue Glargine 35 U daily -Ophtho appt scheduled 7/30

## 2016-09-15 NOTE — Assessment & Plan Note (Addendum)
CKD, stage 3 Assessment: Likely multifactorial CKD given chronic conditions of HTN, DM, HIV. Will continue to strive for improved management of HTN as described in HTN plan, and meet DM goals. Will be seen by Nephrology 09/16/16, will collect relevant labs today to recheck Cr and in preparation for that appt.    Plan:  -Nephrology appt upcoming -Recheck CMP  -CBC w/ diff  -PTH  -Vit D 25  -Phos -Lipid panel

## 2016-09-15 NOTE — Progress Notes (Signed)
  Medical Nutrition Therapy:  Appt start time: 1100 end time:  1130. Visit # 1  Assessment:  Primary concerns today: blood sugar control.  [name] [name] wore a CGM for the past 13 days. He reports his biggest aha moment was when he went to the ED before wearing the CGM and changed from drinking mostly regular soda to water. He did not mention any learning from the CGM. Using the food records he kept, we reviewed other possible reasons for blood sugar control and spikes that were likely caused by food choices.  He has limited insight/interest into food effects on blood sugars/health.  Preferred Learning Style: No preference indicated  Learning Readiness: Ready &  change in progress  ANTHROPOMETRICS: weight-221# BMI-30 WEIGHT HISTORY: Highest: 230s Lowest-188# SLEEP:not discussed today MEDICATIONS: 40 units basiliglar at bedtime BLOOD SUGAR: his CGM download shows much improvement from last download to this one( less highs and lows) , average is 118, 81% of time he was in target, 9% above and 10% below( he was belwo 54 2% of the time). He reports no signs or symptoms of low blood sugar. At risk and hyoglycemicj times are 3am to 9 AM and 2 Pm to 7 PM.  DIETARY INTAKE: Usual eating pattern includes 3 meals and 0 snacks per day.Dining Out (times/week): ~30% of meals   24-hr recall:  B ( AM): eggs, toast,grits, hash brown, orange juice, bacon, tenderloin L ( PM): varies, but may times chicken sandwich & fries or [place] stew, chicken salad, chicken wings, cheeseburger, fries, tuna sub, spaghetti, salad pepsi D ( PM): chicken sandwich, baked chicken, collards, tea,  Broccoli & Beef, curry chicken lo mein Beverages: recently cut way back on soda and is now drinking mostly water/tea  Usual physical activity: not recorded, assume ADLs and light walking  Progress Towards Goal(s):  Some progress.   Nutritional Diagnosis:  NB-1.4 Self-monitoring deficit As related to lack of suffiecient blood sugar  readings to make decisions about his diabetes medicine As evidenced by his meter download .    Intervention:  Nutrition education about prevention of low and high blood sugar by adjusting meal times and amounts. Coordination of care: consider moving insulin to am, if that does not decrease hypoglycemia then would decrease dose by 10%.   Teaching Method Utilized: Visual, Auditory, Hands on Handouts given during visit include:Freestyle Libre Barriers to learning/adherence to lifestyle change: readiness to change Demonstrated degree of understanding via:  Teach Back   Monitoring/Evaluation:  Dietary intake, exercise, meter, and body weight in 1 year(s). [name] [name], [name] [name], [place] 09/15/2016 3:32 PM.

## 2016-09-15 NOTE — Addendum Note (Signed)
Addended by: Joni Reining C on: 09/15/2016 04:50 PM   Modules accepted: Level of Service

## 2016-09-15 NOTE — Assessment & Plan Note (Addendum)
HTN Assessment:  BP elevated today to 184/97 with pt reporting medication adherence with new lisinopril 2.5 mg prescription and amlodipine 10 mg as prescribed. He may benefit from increased lisinopril dose for BP control and to decrease proteinuria. Amlodipine may also contribute to proteinuria and can be considered to be discontinued in the future, but will avoid multiple changes today. Spironolactone may also be considered in the future if he continues to have elevated BP with adherence.   Plan:  -Increase Lisinopril to 5 mg daily -Continue Amlodipine 10 mg  -F/u in 2-4 weeks to assess response

## 2016-09-15 NOTE — Patient Instructions (Signed)
It was very nice to meet you, [name] [name]. Here's what we decided for today.   We will recheck some labs to see if anything has changed after starting lisinopril and also to prepare for your kidney doctor appointment.    Increase your lisinopril to 5 mg daily. You can take two of your 2.5 mg lisinopril pills that are left, and then a new prescription for 5 mg pills has been sent to your pharmacy. Continue your amlodipine 10 mg. If any changes are needed after we see your lab results, then we will call you.   Continue your same insulin doses from last visit, and try to have a small afternoon snack to avoid any afternoon low blood sugars.

## 2016-09-16 LAB — PTH, INTACT AND CALCIUM
CALCIUM: 8.6 mg/dL — AB (ref 8.7–10.2)
PTH: 35 pg/mL (ref 15–65)

## 2016-09-16 LAB — CBC WITH DIFFERENTIAL/PLATELET
Basophils Absolute: 0 10*3/uL (ref 0.0–0.2)
Basos: 0 %
EOS (ABSOLUTE): 0.1 10*3/uL (ref 0.0–0.4)
EOS: 1 %
HEMATOCRIT: 34.5 % — AB (ref 37.5–51.0)
Hemoglobin: 10.9 g/dL — ABNORMAL LOW (ref 13.0–17.7)
IMMATURE GRANULOCYTES: 0 %
Immature Grans (Abs): 0 10*3/uL (ref 0.0–0.1)
Lymphocytes Absolute: 1.8 10*3/uL (ref 0.7–3.1)
Lymphs: 24 %
MCH: 24.5 pg — ABNORMAL LOW (ref 26.6–33.0)
MCHC: 31.6 g/dL (ref 31.5–35.7)
MCV: 78 fL — ABNORMAL LOW (ref 79–97)
MONOCYTES: 6 %
Monocytes Absolute: 0.5 10*3/uL (ref 0.1–0.9)
NEUTROS PCT: 69 %
Neutrophils Absolute: 5 10*3/uL (ref 1.4–7.0)
Platelets: 346 10*3/uL (ref 150–379)
RBC: 4.45 x10E6/uL (ref 4.14–5.80)
RDW: 16.4 % — ABNORMAL HIGH (ref 12.3–15.4)
WBC: 7.4 10*3/uL (ref 3.4–10.8)

## 2016-09-16 LAB — PHOSPHORUS: Phosphorus: 3.7 mg/dL (ref 2.5–4.5)

## 2016-09-16 LAB — CMP14 + ANION GAP
ALT: 22 IU/L (ref 0–44)
AST: 23 IU/L (ref 0–40)
Albumin/Globulin Ratio: 0.6 — ABNORMAL LOW (ref 1.2–2.2)
Albumin: 2.9 g/dL — ABNORMAL LOW (ref 3.5–5.5)
Alkaline Phosphatase: 65 IU/L (ref 39–117)
Anion Gap: 13 mmol/L (ref 10.0–18.0)
BUN / CREAT RATIO: 12 (ref 9–20)
BUN: 26 mg/dL — AB (ref 6–24)
Bilirubin Total: <0.2 mg/dL (ref 0.0–1.2)
CHLORIDE: 104 mmol/L (ref 96–106)
CO2: 24 mmol/L (ref 20–29)
Calcium: 8.6 mg/dL — ABNORMAL LOW (ref 8.7–10.2)
Creatinine, Ser: 2.15 mg/dL — ABNORMAL HIGH (ref 0.76–1.27)
GFR calc non Af Amer: 36 mL/min/{1.73_m2} — ABNORMAL LOW (ref >59)
GFR, EST AFRICAN AMERICAN: 41 mL/min/{1.73_m2} — AB (ref >59)
GLUCOSE: 84 mg/dL (ref 65–99)
Globulin, Total: 4.5 g/dL (ref 1.5–4.5)
Potassium: 3.6 mmol/L (ref 3.5–5.2)
SODIUM: 141 mmol/L (ref 134–144)
TOTAL PROTEIN: 7.4 g/dL (ref 6.0–8.5)

## 2016-09-16 LAB — LIPID PANEL
Chol/HDL Ratio: 5.4 ratio — ABNORMAL HIGH (ref 0.0–5.0)
Cholesterol, Total: 210 mg/dL — ABNORMAL HIGH (ref 100–199)
HDL: 39 mg/dL — AB (ref >39)
LDL Calculated: 144 mg/dL — ABNORMAL HIGH (ref 0–99)
Triglycerides: 134 mg/dL (ref 0–149)
VLDL Cholesterol Cal: 27 mg/dL (ref 5–40)

## 2016-09-16 LAB — VITAMIN D 25 HYDROXY (VIT D DEFICIENCY, FRACTURES): VIT D 25 HYDROXY: 8.9 ng/mL — AB (ref 30.0–100.0)

## 2016-09-25 ENCOUNTER — Other Ambulatory Visit: Payer: Self-pay | Admitting: Nephrology

## 2016-09-25 DIAGNOSIS — N183 Chronic kidney disease, stage 3 unspecified: Secondary | ICD-10-CM

## 2016-09-26 ENCOUNTER — Ambulatory Visit
Admission: RE | Admit: 2016-09-26 | Discharge: 2016-09-26 | Disposition: A | Discharge status: Home / Self Care | Payer: BC Managed Care – PPO | Source: Ambulatory Visit | Attending: Nephrology | Admitting: Nephrology

## 2016-09-26 DIAGNOSIS — N183 Chronic kidney disease, stage 3 unspecified: Secondary | ICD-10-CM

## 2016-09-30 ENCOUNTER — Ambulatory Visit (INDEPENDENT_AMBULATORY_CARE_PROVIDER_SITE_OTHER): Payer: BC Managed Care – PPO

## 2016-09-30 DIAGNOSIS — Z111 Encounter for screening for respiratory tuberculosis: Secondary | ICD-10-CM

## 2016-09-30 LAB — TB SKIN TEST
Induration: 0 mm
TB SKIN TEST: NEGATIVE

## 2016-10-02 ENCOUNTER — Other Ambulatory Visit (HOSPITAL_COMMUNITY): Payer: Self-pay | Admitting: Nephrology

## 2016-10-02 DIAGNOSIS — R768 Other specified abnormal immunological findings in serum: Secondary | ICD-10-CM

## 2016-10-06 ENCOUNTER — Ambulatory Visit: Payer: BC Managed Care – PPO

## 2016-10-07 ENCOUNTER — Other Ambulatory Visit: Payer: Self-pay | Admitting: Internal Medicine

## 2016-10-08 ENCOUNTER — Other Ambulatory Visit: Payer: BC Managed Care – PPO

## 2016-10-08 ENCOUNTER — Other Ambulatory Visit (HOSPITAL_COMMUNITY)
Admission: RE | Admit: 2016-10-08 | Discharge: 2016-10-08 | Disposition: A | Discharge status: Home / Self Care | Payer: BC Managed Care – PPO | Source: Ambulatory Visit | Attending: Infectious Diseases | Admitting: Infectious Diseases

## 2016-10-08 DIAGNOSIS — Z113 Encounter for screening for infections with a predominantly sexual mode of transmission: Secondary | ICD-10-CM

## 2016-10-08 DIAGNOSIS — B20 Human immunodeficiency virus [HIV] disease: Secondary | ICD-10-CM

## 2016-10-08 LAB — COMPLETE METABOLIC PANEL WITH GFR
ALT: 17 U/L (ref 9–46)
AST: 17 U/L (ref 10–40)
Albumin: 2.8 g/dL — ABNORMAL LOW (ref 3.6–5.1)
Alkaline Phosphatase: 60 U/L (ref 40–115)
BUN: 26 mg/dL — ABNORMAL HIGH (ref 7–25)
CALCIUM: 8.3 mg/dL — AB (ref 8.6–10.3)
CHLORIDE: 108 mmol/L (ref 98–110)
CO2: 24 mmol/L (ref 20–31)
Creat: 3.07 mg/dL — ABNORMAL HIGH (ref 0.60–1.35)
GFR, EST AFRICAN AMERICAN: 27 mL/min — AB (ref >=60)
GFR, Est Non African American: 23 mL/min — ABNORMAL LOW (ref >=60)
Glucose, Bld: 125 mg/dL — ABNORMAL HIGH (ref 65–99)
POTASSIUM: 4.1 mmol/L (ref 3.5–5.3)
Sodium: 140 mmol/L (ref 135–146)
Total Bilirubin: 0.2 mg/dL (ref 0.2–1.2)
Total Protein: 7.4 g/dL (ref 6.1–8.1)

## 2016-10-08 LAB — CBC WITH DIFFERENTIAL/PLATELET
BASOS PCT: 0 %
Basophils Absolute: 0 cells/uL (ref 0–200)
EOS ABS: 146 {cells}/uL (ref 15–500)
Eosinophils Relative: 2 %
HEMATOCRIT: 33.5 % — AB (ref 38.5–50.0)
Hemoglobin: 10.5 g/dL — ABNORMAL LOW (ref 13.2–17.1)
LYMPHS PCT: 24 %
Lymphs Abs: 1752 cells/uL (ref 850–3900)
MCH: 24.7 pg — ABNORMAL LOW (ref 27.0–33.0)
MCHC: 31.3 g/dL — AB (ref 32.0–36.0)
MCV: 78.8 fL — AB (ref 80.0–100.0)
MONO ABS: 584 {cells}/uL (ref 200–950)
MONOS PCT: 8 %
MPV: 8.6 fL (ref 7.5–12.5)
NEUTROS PCT: 66 %
Neutro Abs: 4818 cells/uL (ref 1500–7800)
PLATELETS: 332 10*3/uL (ref 140–400)
RBC: 4.25 MIL/uL (ref 4.20–5.80)
RDW: 17 % — AB (ref 11.0–15.0)
WBC: 7.3 10*3/uL (ref 3.8–10.8)

## 2016-10-09 LAB — URINE CYTOLOGY ANCILLARY ONLY
CHLAMYDIA, DNA PROBE: NEGATIVE
Neisseria Gonorrhea: NEGATIVE

## 2016-10-09 LAB — RPR: RPR: REACTIVE — AB

## 2016-10-09 LAB — FLUORESCENT TREPONEMAL AB(FTA)-IGG-BLD: Fluorescent Treponemal ABS: REACTIVE — AB

## 2016-10-09 LAB — T-HELPER CELL (CD4) - (RCID CLINIC ONLY)
CD4 % Helper T Cell: 18 % — ABNORMAL LOW (ref 33–55)
CD4 T CELL ABS: 420 /uL (ref 400–2700)

## 2016-10-09 LAB — RPR TITER

## 2016-10-10 ENCOUNTER — Encounter: Payer: Self-pay | Admitting: Internal Medicine

## 2016-10-10 ENCOUNTER — Ambulatory Visit (INDEPENDENT_AMBULATORY_CARE_PROVIDER_SITE_OTHER): Payer: BC Managed Care – PPO | Admitting: Internal Medicine

## 2016-10-10 VITALS — BP 166/100 | HR 80 | Temp 98.4°F | Wt 228.4 lb

## 2016-10-10 DIAGNOSIS — N183 Chronic kidney disease, stage 3 unspecified: Secondary | ICD-10-CM

## 2016-10-10 DIAGNOSIS — I129 Hypertensive chronic kidney disease with stage 1 through stage 4 chronic kidney disease, or unspecified chronic kidney disease: Secondary | ICD-10-CM

## 2016-10-10 DIAGNOSIS — Z841 Family history of disorders of kidney and ureter: Secondary | ICD-10-CM

## 2016-10-10 DIAGNOSIS — Z79899 Other long term (current) drug therapy: Secondary | ICD-10-CM

## 2016-10-10 DIAGNOSIS — Z8249 Family history of ischemic heart disease and other diseases of the circulatory system: Secondary | ICD-10-CM | POA: Diagnosis not present

## 2016-10-10 DIAGNOSIS — I10 Essential (primary) hypertension: Secondary | ICD-10-CM

## 2016-10-10 NOTE — Progress Notes (Signed)
   CC: Hypertension follow-up  HPI:  [name] is a 46 y.o. male with past medical history as described below who presents to the clinic for follow-up of hypertension.  He was recently seen on 7/2 with a blood pressure of 184/97 without symptoms concerning for hypertensive emergency. His dose of lisinopril was increased from 2.5 mg to 5 mg. Since that time, he was seen by nephrology initially on 7/13 and also yesterday. Per the patient, they altered his blood pressure regimen to labetalol, metoprolol, and amlodipine. Unfortunately the visit note is incomplete and unable to be reviewed today.  Today his blood pressure remains elevated at 175/100 and 166/100 on recheck. He denies headache, chest pain, shortness of breath, or new vision changes. He reports adherence with his medication including doses this morning.     Past Medical History:  Diagnosis Date  . Depression   . Diabetes mellitus    type II 2006  . Hepatitis B infection    hx of hepatitis infection  . HIV infection ([place])   . Hyperlipidemia   . Hypertension   . Syphilis    Review of Systems:  Review of Systems  Constitutional: Negative for diaphoresis and fever.  Respiratory: Negative for shortness of breath.   Cardiovascular: Negative for chest pain.  Gastrointestinal: Negative for abdominal pain.  Neurological: Negative for headaches.     Physical Exam:  Vitals:   10/10/16 0921 10/10/16 0927  BP: (!) 175/100 (!) 166/100  Pulse: 81 80  Temp: 98.4 F (36.9 C)   TempSrc: Oral   SpO2: 100%   Weight: 228 lb 6.4 oz (103.6 kg)    Physical Exam  Constitutional: He appears well-developed and well-nourished. No distress.  HENT:  Head: Normocephalic and atraumatic.  Cardiovascular: Normal rate, regular rhythm, normal heart sounds and intact distal pulses.   Pulmonary/Chest: Effort normal and breath sounds normal.  Skin: Skin is warm and dry. Capillary refill takes less than 2 seconds.    Assessment &  Plan:   See Encounters Tab for problem based charting.  Patient seen with Dr. Evette Doffing

## 2016-10-10 NOTE — Patient Instructions (Addendum)
It was good to see you again today [name] [name].  Your blood pressure was a little high again today, but your new medications may need a little extra time to work so we will make any changes. However, we would recommend to not take both the labetalol and metoprolol since they are very similar medicines. Continue taking the labetalol as prescribed and stop the metoprolol. Continue to follow with the kidney doctor and we will keep working with him to get your kidneys and blood pressure as healthy as possible.

## 2016-10-10 NOTE — Assessment & Plan Note (Signed)
Today his blood pressure is elevated to 175/100, 166/100 on recheck. He does not have any symptoms concerning for hypertensive emergency. He has recently been seen by nephrology on 7/13 and yesterday. On review from the available note on 7/13 it appears they were initially recommending tight blood pressure and glucose control to prevent progression of his CK D and agreed with his previous regimen. However labs from that visit showed a bump in his creatinine from 2.15 to 2.98. The patient reports on his subsequent nephrology visit they altered his blood pressure regimen by discontinuing lisinopril and adding labetalol and metoprolol. He reports his blood pressure at yesterday's visit was 144/86 and 142/80. The official note from this visit is unavailable, however we are hesitant to recommend that he continue 2 beta blockers concurrently. We will advise to continue the labetalol, hold metoprolol, and continue amlodipine. Once records are available and we can review nephrology's latest recommendations we will update the plan accordingly.

## 2016-10-10 NOTE — Assessment & Plan Note (Signed)
Has established care with nephrologist, will review their recommendations when available. Patient notes he has a renal biopsy upcoming. See hypertension assessment and plan for further details

## 2016-10-13 ENCOUNTER — Ambulatory Visit (HOSPITAL_COMMUNITY)
Admission: RE | Admit: 2016-10-13 | Discharge: 2016-10-13 | Disposition: A | Discharge status: Home / Self Care | Payer: BC Managed Care – PPO | Source: Ambulatory Visit | Attending: Nephrology | Admitting: Nephrology

## 2016-10-13 LAB — HIV-1 RNA QUANT-NO REFLEX-BLD
HIV 1 RNA QUANT: 74 {copies}/mL — AB
HIV-1 RNA Quant, Log: 1.87 Log copies/mL — ABNORMAL HIGH

## 2016-10-13 NOTE — Progress Notes (Signed)
Internal Medicine Clinic Attending  I saw and evaluated the patient.  I personally confirmed the key portions of the history and exam documented by Dr. Harden and I reviewed pertinent patient test results.  The assessment, diagnosis, and plan were formulated together and I agree with the documentation in the resident's note.  

## 2016-10-14 LAB — HM DIABETES EYE EXAM

## 2016-10-16 ENCOUNTER — Other Ambulatory Visit: Payer: Self-pay | Admitting: Physician Assistant

## 2016-10-16 ENCOUNTER — Other Ambulatory Visit: Payer: Self-pay | Admitting: Radiology

## 2016-10-17 ENCOUNTER — Encounter (HOSPITAL_COMMUNITY): Payer: Self-pay

## 2016-10-17 ENCOUNTER — Ambulatory Visit (HOSPITAL_COMMUNITY)
Admission: RE | Admit: 2016-10-17 | Discharge: 2016-10-17 | Disposition: A | Discharge status: Home / Self Care | Payer: BC Managed Care – PPO | Source: Ambulatory Visit | Attending: Nephrology | Admitting: Nephrology

## 2016-10-17 DIAGNOSIS — Z8249 Family history of ischemic heart disease and other diseases of the circulatory system: Secondary | ICD-10-CM | POA: Insufficient documentation

## 2016-10-17 DIAGNOSIS — R809 Proteinuria, unspecified: Secondary | ICD-10-CM | POA: Diagnosis not present

## 2016-10-17 DIAGNOSIS — Z841 Family history of disorders of kidney and ureter: Secondary | ICD-10-CM | POA: Diagnosis not present

## 2016-10-17 DIAGNOSIS — E1122 Type 2 diabetes mellitus with diabetic chronic kidney disease: Secondary | ICD-10-CM | POA: Diagnosis not present

## 2016-10-17 DIAGNOSIS — R769 Abnormal immunological finding in serum, unspecified: Secondary | ICD-10-CM | POA: Diagnosis present

## 2016-10-17 DIAGNOSIS — R768 Other specified abnormal immunological findings in serum: Secondary | ICD-10-CM

## 2016-10-17 DIAGNOSIS — Z833 Family history of diabetes mellitus: Secondary | ICD-10-CM | POA: Diagnosis not present

## 2016-10-17 DIAGNOSIS — N189 Chronic kidney disease, unspecified: Secondary | ICD-10-CM | POA: Diagnosis not present

## 2016-10-17 DIAGNOSIS — I129 Hypertensive chronic kidney disease with stage 1 through stage 4 chronic kidney disease, or unspecified chronic kidney disease: Secondary | ICD-10-CM | POA: Insufficient documentation

## 2016-10-17 DIAGNOSIS — B20 Human immunodeficiency virus [HIV] disease: Secondary | ICD-10-CM | POA: Insufficient documentation

## 2016-10-17 DIAGNOSIS — E785 Hyperlipidemia, unspecified: Secondary | ICD-10-CM | POA: Insufficient documentation

## 2016-10-17 LAB — CBC
HEMATOCRIT: 31.9 % — AB (ref 39.0–52.0)
HEMOGLOBIN: 10.2 g/dL — AB (ref 13.0–17.0)
MCH: 25.1 pg — AB (ref 26.0–34.0)
MCHC: 32 g/dL (ref 30.0–36.0)
MCV: 78.4 fL (ref 78.0–100.0)
Platelets: 288 10*3/uL (ref 150–400)
RBC: 4.07 MIL/uL — ABNORMAL LOW (ref 4.22–5.81)
RDW: 16.4 % — AB (ref 11.5–15.5)
WBC: 7.4 10*3/uL (ref 4.0–10.5)

## 2016-10-17 LAB — BASIC METABOLIC PANEL
ANION GAP: 5 (ref 5–15)
BUN: 31 mg/dL — ABNORMAL HIGH (ref 6–20)
CHLORIDE: 110 mmol/L (ref 101–111)
CO2: 24 mmol/L (ref 22–32)
Calcium: 8.5 mg/dL — ABNORMAL LOW (ref 8.9–10.3)
Creatinine, Ser: 3.02 mg/dL — ABNORMAL HIGH (ref 0.61–1.24)
GFR calc Af Amer: 27 mL/min — ABNORMAL LOW (ref >60)
GFR, EST NON AFRICAN AMERICAN: 23 mL/min — AB (ref >60)
GLUCOSE: 168 mg/dL — AB (ref 65–99)
POTASSIUM: 3.6 mmol/L (ref 3.5–5.1)
Sodium: 139 mmol/L (ref 135–145)

## 2016-10-17 LAB — PROTIME-INR
INR: 1.07
Prothrombin Time: 13.9 seconds (ref 11.4–15.2)

## 2016-10-17 LAB — APTT: aPTT: 27 seconds (ref 24–36)

## 2016-10-17 MED ORDER — HYDRALAZINE HCL 20 MG/ML IJ SOLN
INTRAMUSCULAR | Status: AC
  Administered 2016-10-17: 10 mg via INTRAVENOUS
  Filled 2016-10-17: qty 1

## 2016-10-17 MED ORDER — SODIUM CHLORIDE 0.9 % IV SOLN: INTRAVENOUS | Status: DC

## 2016-10-17 MED ORDER — HYDRALAZINE HCL 20 MG/ML IJ SOLN
10.0000 mg | INTRAMUSCULAR | Status: AC
  Administered 2016-10-17: 10 mg via INTRAVENOUS

## 2016-10-17 NOTE — Progress Notes (Signed)
Patient discharged, walking with his friend.

## 2016-10-17 NOTE — H&P (Signed)
Chief Complaint: proteinuria, CKD  Referring Physician:Dr. Donato Heinz  Supervising Physician: Markus Daft  Patient Status: [facility] - Out-pt  HPI: [name] [name] is a 46 y.o. male with a history of CKD proteinuria, HIV, Hep B, Syphilis, HTN, and DM who presents today for a random renal biopsy secondary to proteinuria.  He is followed by Dr. [name] who referred him to nephrology for evaluation.  After this evaluation he was felt to need a biopsy.  He normally has HTN and takes his medications daily; however, he has not taken them today and is having hypertension upon arrival.    Past Medical History:  Past Medical History:  Diagnosis Date  . Depression   . Diabetes mellitus    type II 2006  . Hepatitis B infection    hx of hepatitis infection  . HIV infection ([place])   . Hyperlipidemia   . Hypertension   . Syphilis     Past Surgical History: History reviewed. No pertinent surgical history.  Family History:  Family History  Problem Relation Age of Onset  . Coronary artery disease Maternal Grandfather   . Kidney disease Mother   . Diabetes Mother   . Heart disease Mother   . Kidney disease Father   . Diabetes Maternal Aunt   . Diabetes Maternal Uncle   . Diabetes Maternal Grandmother     Social History:  reports that he has never smoked. He has never used smokeless tobacco. He reports that he does not drink alcohol or use drugs.  Allergies: No Known Allergies  Medications: Medications reviewed in epic  Please HPI for pertinent positives, otherwise complete 10 system ROS negative.  Mallampati Score: MD Evaluation Airway: WNL Heart: WNL Abdomen: WNL Chest/ Lungs: WNL ASA  Classification: 3 Mallampati/Airway Score: Two  Physical Exam: BP (!) (P) 201/110 (BP Location: Right Arm)   Pulse 67   Temp 98 F (36.7 C) (Oral)   Ht 6' (1.829 m)   Wt 228 lb (103.4 kg)   SpO2 98%   BMI 30.92 kg/m  Body mass index is 30.92 kg/m. General: pleasant, WD, WN  black male who is laying in bed in NAD HEENT: head is normocephalic, atraumatic.  Sclera are noninjected.  PERRL.  Ears and nose without any masses or lesions.  Mouth is pink and moist Heart: regular, rate, and rhythm.  Normal s1,s2. No obvious murmurs, gallops, or rubs noted.  Palpable radial and pedal pulses bilaterally Lungs: CTAB, no wheezes, rhonchi, or rales noted.  Respiratory effort nonlabored Abd: soft, NT, ND, +BS, no masses, hernias, or organomegaly Psych: A&Ox3 with an appropriate affect.   Labs: Results for orders placed or performed during the hospital encounter of 10/17/16 (from the past 48 hour(s))  APTT upon arrival     Status: None   Collection Time: 10/17/16  6:28 AM  Result Value Ref Range   aPTT 27 24 - 36 seconds  CBC upon arrival     Status: Abnormal   Collection Time: 10/17/16  6:28 AM  Result Value Ref Range   WBC 7.4 4.0 - 10.5 K/uL   RBC 4.07 (L) 4.22 - 5.81 MIL/uL   Hemoglobin 10.2 (L) 13.0 - 17.0 g/dL   HCT 31.9 (L) 39.0 - 52.0 %   MCV 78.4 78.0 - 100.0 fL   MCH 25.1 (L) 26.0 - 34.0 pg   MCHC 32.0 30.0 - 36.0 g/dL   RDW 16.4 (H) 11.5 - 15.5 %   Platelets 288 150 - 400 K/uL  Protime-INR upon arrival     Status: None   Collection Time: 10/17/16  6:28 AM  Result Value Ref Range   Prothrombin Time 13.9 11.4 - 15.2 seconds   INR 2.77   Basic metabolic panel     Status: Abnormal   Collection Time: 10/17/16  6:28 AM  Result Value Ref Range   Sodium 139 135 - 145 mmol/L   Potassium 3.6 3.5 - 5.1 mmol/L   Chloride 110 101 - 111 mmol/L   CO2 24 22 - 32 mmol/L   Glucose, Bld 168 (H) 65 - 99 mg/dL   BUN 31 (H) 6 - 20 mg/dL   Creatinine, Ser 3.02 (H) 0.61 - 1.24 mg/dL   Calcium 8.5 (L) 8.9 - 10.3 mg/dL   GFR calc non Af Amer 23 (L) >60 mL/min   GFR calc Af Amer 27 (L) >60 mL/min    Comment: (NOTE) The eGFR has been calculated using the CKD EPI equation. This calculation has not been validated in all clinical situations. eGFR's persistently <60 mL/min  signify possible Chronic Kidney Disease.    Anion gap 5 5 - 15    Imaging: No results found.  Assessment/Plan 1. CKD, proteinuria 2. HTN  We will start by giving 23m of hydralazine to see if we can get his BP down.  If we can then we will proceed with his random renal biopsy.  If we can not, then we will have to reschedule.  His other labs and vitals have been reviewed. Risks and benefits discussed with the patient including, but not limited to bleeding, infection, damage to adjacent structures or low yield requiring additional tests. All of the patient's questions were answered, patient is agreeable to proceed. Consent signed and in chart.  ADDENDUM:Unable to get BP down, Dr. HAnselm Pancoastspoke with Dr. CMarval Regaland we will cancel today and have him follow up with him in the office next week to address this issue.  Patient understands and is agreeable.   Thank you for this interesting consult.  I greatly enjoyed meeting [name] HICKOXand look forward to participating in their care.  A copy of this report was sent to the requesting provider on this date.  Electronically Signed: OHenreitta Cea8/05/2016, 8:29 AM   I spent a total of  40 Minutes   in face to face in clinical consultation, greater than 50% of which was counseling/coordinating care for CKD, proteinuria

## 2016-10-17 NOTE — Progress Notes (Signed)
BP at 0847 204/117. Dr Anselm Pancoast notified and he called Dr Arty Baumgartner who told him as long as patient  is feeling fine he is OK to go home. Patient states he is feeling fine and denies headache. Dr Anselm Pancoast had talked to Dr Arty Baumgartner earlier and renal biopsy cancelled due to elevated BP. Dr Anselm Pancoast was in to see patient re procedure cancelled until his BP is under better control. Patient is aware to call Dr. Birdie Riddle office re making an appointment and to check on whether he is to take Metoprolol as well as Labetolol. Patient aware to take his po meds for BP as soon as he gets home.

## 2016-10-22 ENCOUNTER — Ambulatory Visit: Payer: BC Managed Care – PPO | Admitting: Infectious Diseases

## 2016-11-09 ENCOUNTER — Observation Stay (HOSPITAL_COMMUNITY): Payer: BC Managed Care – PPO

## 2016-11-09 ENCOUNTER — Inpatient Hospital Stay (HOSPITAL_COMMUNITY)
Admission: EM | Admit: 2016-11-09 | Discharge: 2016-11-11 | DRG: 291 | Disposition: A | Discharge status: Home / Self Care | Payer: BC Managed Care – PPO | Attending: Internal Medicine | Admitting: Internal Medicine

## 2016-11-09 ENCOUNTER — Encounter (HOSPITAL_COMMUNITY): Payer: Self-pay | Admitting: Emergency Medicine

## 2016-11-09 ENCOUNTER — Emergency Department (HOSPITAL_COMMUNITY): Payer: BC Managed Care – PPO

## 2016-11-09 DIAGNOSIS — I5033 Acute on chronic diastolic (congestive) heart failure: Secondary | ICD-10-CM | POA: Diagnosis not present

## 2016-11-09 DIAGNOSIS — R809 Proteinuria, unspecified: Secondary | ICD-10-CM

## 2016-11-09 DIAGNOSIS — F329 Major depressive disorder, single episode, unspecified: Secondary | ICD-10-CM | POA: Diagnosis present

## 2016-11-09 DIAGNOSIS — N184 Chronic kidney disease, stage 4 (severe): Secondary | ICD-10-CM | POA: Diagnosis present

## 2016-11-09 DIAGNOSIS — I10 Essential (primary) hypertension: Secondary | ICD-10-CM | POA: Diagnosis present

## 2016-11-09 DIAGNOSIS — E785 Hyperlipidemia, unspecified: Secondary | ICD-10-CM | POA: Diagnosis present

## 2016-11-09 DIAGNOSIS — I130 Hypertensive heart and chronic kidney disease with heart failure and stage 1 through stage 4 chronic kidney disease, or unspecified chronic kidney disease: Principal | ICD-10-CM | POA: Diagnosis present

## 2016-11-09 DIAGNOSIS — E0829 Diabetes mellitus due to underlying condition with other diabetic kidney complication: Secondary | ICD-10-CM

## 2016-11-09 DIAGNOSIS — B20 Human immunodeficiency virus [HIV] disease: Secondary | ICD-10-CM | POA: Diagnosis present

## 2016-11-09 DIAGNOSIS — Z794 Long term (current) use of insulin: Secondary | ICD-10-CM

## 2016-11-09 DIAGNOSIS — Z79899 Other long term (current) drug therapy: Secondary | ICD-10-CM

## 2016-11-09 DIAGNOSIS — N185 Chronic kidney disease, stage 5: Secondary | ICD-10-CM | POA: Diagnosis present

## 2016-11-09 DIAGNOSIS — I5030 Unspecified diastolic (congestive) heart failure: Secondary | ICD-10-CM | POA: Diagnosis present

## 2016-11-09 DIAGNOSIS — N179 Acute kidney failure, unspecified: Secondary | ICD-10-CM | POA: Diagnosis present

## 2016-11-09 DIAGNOSIS — N049 Nephrotic syndrome with unspecified morphologic changes: Secondary | ICD-10-CM | POA: Diagnosis present

## 2016-11-09 DIAGNOSIS — B1910 Unspecified viral hepatitis B without hepatic coma: Secondary | ICD-10-CM | POA: Diagnosis present

## 2016-11-09 DIAGNOSIS — R0602 Shortness of breath: Secondary | ICD-10-CM

## 2016-11-09 DIAGNOSIS — D649 Anemia, unspecified: Secondary | ICD-10-CM | POA: Diagnosis not present

## 2016-11-09 DIAGNOSIS — D631 Anemia in chronic kidney disease: Secondary | ICD-10-CM | POA: Diagnosis present

## 2016-11-09 DIAGNOSIS — E1122 Type 2 diabetes mellitus with diabetic chronic kidney disease: Secondary | ICD-10-CM | POA: Diagnosis present

## 2016-11-09 LAB — I-STAT CHEM 8, ED
BUN: 30 mg/dL — AB (ref 6–20)
CREATININE: 3.1 mg/dL — AB (ref 0.61–1.24)
Calcium, Ion: 1.12 mmol/L — ABNORMAL LOW (ref 1.15–1.40)
Chloride: 111 mmol/L (ref 101–111)
Glucose, Bld: 82 mg/dL (ref 65–99)
HEMATOCRIT: 31 % — AB (ref 39.0–52.0)
HEMOGLOBIN: 10.5 g/dL — AB (ref 13.0–17.0)
POTASSIUM: 3.6 mmol/L (ref 3.5–5.1)
SODIUM: 144 mmol/L (ref 135–145)
TCO2: 23 mmol/L (ref 22–32)

## 2016-11-09 LAB — CBC
HEMATOCRIT: 32.7 % — AB (ref 39.0–52.0)
Hemoglobin: 10 g/dL — ABNORMAL LOW (ref 13.0–17.0)
MCH: 24.2 pg — AB (ref 26.0–34.0)
MCHC: 30.6 g/dL (ref 30.0–36.0)
MCV: 79.2 fL (ref 78.0–100.0)
Platelets: 291 10*3/uL (ref 150–400)
RBC: 4.13 MIL/uL — ABNORMAL LOW (ref 4.22–5.81)
RDW: 15.6 % — ABNORMAL HIGH (ref 11.5–15.5)
WBC: 7 10*3/uL (ref 4.0–10.5)

## 2016-11-09 LAB — BASIC METABOLIC PANEL
Anion gap: 5 (ref 5–15)
BUN: 27 mg/dL — ABNORMAL HIGH (ref 6–20)
CO2: 22 mmol/L (ref 22–32)
Calcium: 8.2 mg/dL — ABNORMAL LOW (ref 8.9–10.3)
Chloride: 110 mmol/L (ref 101–111)
Creatinine, Ser: 3.07 mg/dL — ABNORMAL HIGH (ref 0.61–1.24)
GFR calc Af Amer: 26 mL/min — ABNORMAL LOW (ref >60)
GFR calc non Af Amer: 23 mL/min — ABNORMAL LOW (ref >60)
GLUCOSE: 81 mg/dL (ref 65–99)
POTASSIUM: 3.4 mmol/L — AB (ref 3.5–5.1)
Sodium: 137 mmol/L (ref 135–145)

## 2016-11-09 LAB — FERRITIN: FERRITIN: 49 ng/mL (ref 24–336)

## 2016-11-09 LAB — IRON AND TIBC
IRON: 47 ug/dL (ref 45–182)
Saturation Ratios: 14 % — ABNORMAL LOW (ref 17.9–39.5)
TIBC: 340 ug/dL (ref 250–450)
UIBC: 293 ug/dL

## 2016-11-09 LAB — BRAIN NATRIURETIC PEPTIDE: B Natriuretic Peptide: 332 pg/mL — ABNORMAL HIGH (ref 0.0–100.0)

## 2016-11-09 LAB — PROCALCITONIN

## 2016-11-09 LAB — I-STAT TROPONIN, ED: Troponin i, poc: 0.05 ng/mL (ref 0.00–0.08)

## 2016-11-09 LAB — PROTEIN, URINE, RANDOM: TOTAL PROTEIN, URINE: 440 mg/dL

## 2016-11-09 LAB — CREATININE, URINE, RANDOM: CREATININE, URINE: 78.13 mg/dL

## 2016-11-09 LAB — GLUCOSE, CAPILLARY
GLUCOSE-CAPILLARY: 121 mg/dL — AB (ref 65–99)
Glucose-Capillary: 78 mg/dL (ref 65–99)
Glucose-Capillary: 105 mg/dL — ABNORMAL HIGH (ref 65–99)

## 2016-11-09 LAB — TSH: TSH: 1.526 u[IU]/mL (ref 0.350–4.500)

## 2016-11-09 MED ORDER — FUROSEMIDE 10 MG/ML IJ SOLN
40.0000 mg | Freq: Every day | INTRAMUSCULAR | Status: DC
  Administered 2016-11-10: 40 mg via INTRAVENOUS
  Filled 2016-11-09: qty 4

## 2016-11-09 MED ORDER — METOPROLOL SUCCINATE ER 50 MG PO TB24
50.0000 mg | ORAL_TABLET | Freq: Every day | ORAL | Status: DC
Start: 2016-11-10 — End: 2016-11-10
  Administered 2016-11-10: 50 mg via ORAL
  Filled 2016-11-09: qty 1

## 2016-11-09 MED ORDER — FUROSEMIDE 10 MG/ML IJ SOLN
40.0000 mg | Freq: Once | INTRAMUSCULAR | Status: AC
  Administered 2016-11-09: 40 mg via INTRAVENOUS
  Filled 2016-11-09: qty 4

## 2016-11-09 MED ORDER — NITROGLYCERIN 0.4 MG SL SUBL
0.4000 mg | SUBLINGUAL_TABLET | SUBLINGUAL | Status: DC | PRN
  Administered 2016-11-09 (×2): 0.4 mg via SUBLINGUAL
  Filled 2016-11-09: qty 1

## 2016-11-09 MED ORDER — DEXTROSE 5 % IV SOLN
1.0000 g | Freq: Once | INTRAVENOUS | Status: AC
  Administered 2016-11-09: 1 g via INTRAVENOUS
  Filled 2016-11-09: qty 10

## 2016-11-09 MED ORDER — INSULIN GLARGINE 100 UNIT/ML ~~LOC~~ SOLN
30.0000 [IU] | Freq: Every day | SUBCUTANEOUS | Status: DC
  Administered 2016-11-09 – 2016-11-10 (×2): 30 [IU] via SUBCUTANEOUS
  Filled 2016-11-09 (×3): qty 0.3

## 2016-11-09 MED ORDER — LABETALOL HCL 200 MG PO TABS
300.0000 mg | ORAL_TABLET | Freq: Two times a day (BID) | ORAL | Status: DC
  Administered 2016-11-09 – 2016-11-11 (×4): 300 mg via ORAL
  Filled 2016-11-09 (×3): qty 1

## 2016-11-09 MED ORDER — ABACAVIR-DOLUTEGRAVIR-LAMIVUD 600-50-300 MG PO TABS
1.0000 | ORAL_TABLET | ORAL | Status: DC
  Administered 2016-11-09 – 2016-11-11 (×3): 1 via ORAL
  Filled 2016-11-09 (×3): qty 1

## 2016-11-09 MED ORDER — ENOXAPARIN SODIUM 40 MG/0.4ML ~~LOC~~ SOLN
40.0000 mg | SUBCUTANEOUS | Status: DC
  Administered 2016-11-09 – 2016-11-10 (×2): 40 mg via SUBCUTANEOUS
  Filled 2016-11-09 (×2): qty 0.4

## 2016-11-09 MED ORDER — INSULIN ASPART 100 UNIT/ML ~~LOC~~ SOLN
0.0000 [IU] | Freq: Three times a day (TID) | SUBCUTANEOUS | Status: DC
Start: 2016-11-09 — End: 2016-11-11
  Administered 2016-11-10: 2 [IU] via SUBCUTANEOUS
  Administered 2016-11-10 – 2016-11-11 (×2): 1 [IU] via SUBCUTANEOUS

## 2016-11-09 MED ORDER — POTASSIUM CHLORIDE CRYS ER 20 MEQ PO TBCR
20.0000 meq | EXTENDED_RELEASE_TABLET | Freq: Every day | ORAL | Status: DC
  Administered 2016-11-09 – 2016-11-11 (×3): 20 meq via ORAL
  Filled 2016-11-09 (×3): qty 1

## 2016-11-09 NOTE — H&P (Signed)
Date: 11/09/2016               Patient Name:  [name] [name] MRN: [id number]  DOB: [birth year] Age / Sex: 46 y.o., male   PCP: Tawny Asal, MD         Medical Service: Internal Medicine Teaching Service         Attending Physician: Dr. Oval Linsey, MD    First Contact: Dr. Ronalee Red Pager: [phone]  Second Contact: Dr. Charlynn Grimes Pager: [phone]       After Hours (After 5p/  First Contact Pager: [phone]  weekends / holidays): Second Contact Pager: [phone]   Chief Complaint: shortness of breath  History of Present Illness:  [name] is a 46yo male with PMH significant for HTN, DM2, and HIV who presents on 8/26 with acute onset shortness of breath and lower extremity swelling. He was in his usual state of health until Thursday when he noticed that both of his lower extremities were swollen and tight. He states his right leg is normally swollen after he broke his leg. His continued to have LE edema, so yesterday, he decided to lie down on the floor and elevate his legs, at which point he began feeling short of breath. He sat up with mild improvement in his shortness of breath. He went to sleep and continued to feel persistently short of breath this morning, which prompted him to come to the emergency room. His shortness of breath is worsened with exertion and he feels that he cannot walk as far as he previously could before getting short of breath. He was short of breath walking from his car to the ED waiting room. Is normally able to lie flat, although yesterday felt that he could not. Endorses associated diaphoresis, as well as congestion and a cough productive of clear sputum and emesis x1 in the ED. Denies chest pain, fevers, changes in BMs, abdominal pain, pain in lower extremities, or dysuria. No recent travel or surgeries.   In the ER, BP 187/103, HR 84, temp 97.6, RR 17, SpO2 94% on RA. BNP elevated to 332, trop 0.05, BMP significant for Cr 3.07. WBC 7.0. EKG with NSR and RAD.  CXR with multifocal patchy opacities, predominantly in the lower lobes and trace bilateral pleural effusions. Received 1 dose of ceftriaxone.  Meds:  Current Meds  Medication Sig  . amLODipine (NORVASC) 10 MG tablet Take 1 tablet (10 mg total) by mouth daily.  . Insulin Glargine (BASAGLAR KWIKPEN) 100 UNIT/ML SOPN Inject 0.35 mLs (35 Units total) into the skin daily. (Patient taking differently: Inject 40 Units into the skin daily. )  . labetalol (NORMODYNE) 300 MG tablet Take 300 mg by mouth 2 (two) times daily.  . metoprolol succinate (TOPROL-XL) 50 MG 24 hr tablet Take 50 mg by mouth daily. Take with or immediately following a meal.  . TRIUMEQ 600-50-300 MG tablet TAKE ONE TABLET BY MOUTH ONCE DAILY. STORE IN ORIGINAL BOTTLE AT [facility].   Allergies: Allergies as of 11/09/2016  . (No Known Allergies)   Past Medical History:  Diagnosis Date  . Depression   . Diabetes mellitus    type II 2006  . Hepatitis B infection    hx of hepatitis infection  . HIV infection ([place])   . Hyperlipidemia   . Hypertension   . Syphilis    Family History:  Family History  Problem Relation Age of Onset  . Coronary artery disease Maternal Grandfather   . Kidney disease Mother   .  Diabetes Mother   . Heart disease Mother   . Kidney disease Father   . Diabetes Maternal Aunt   . Diabetes Maternal Uncle   . Diabetes Maternal Grandmother    Social History:  - denies smoking, alcohol, or other illicit drugs  Review of Systems: A complete ROS was negative except as per HPI.  Physical Exam: Blood pressure (!) 167/101, pulse 84, temperature 97.6 F (36.4 C), temperature source Oral, resp. rate 17, height 6' (1.829 m), weight 230 lb (104.3 kg), SpO2 93 %.  GEN: Well-appearing, AA male sitting comfortably in bed in NAD HENT: Moist mucous membranes. No visible lesions. RESP: Clear to auscultation bilaterally. Expiratory wheezes diffusely. No increased work of breathing on RA. CV: Normal rate  and regular rhythm. No murmurs, gallops, or rubs. JVD elevated to midway up neck. 3+ BLE edema. ABD: Soft. Non-tender. Non-distended. Normoactive bowel sounds. EXT: 3+ BLE edema to knees, left possibly larger than right. Calves non-tender. Large vertical scar on lateral left knee. 2+ bilateral DP pulses.  EKG: NSR, RAD  CXR: Multifocal patchy opacities, lower lobe predominant, suspicious for multifocal pneumonia. Mild interstitial edema is considered less likely. Trace bilateral pleural effusions.  Assessment & Plan by Problem: Active Problems:   Shortness of breath  [name] is a 46yo male with PMH significant for HTN, DM2, and HIV who presents on 8/26 with acute onset shortness of breath and lower extremity swelling for the last 4 days.  # Shortness of breath Patient presents with acute onset of shortness of breath and orthopnea that began yesterday. No history of CHF or other lung disease. DDx: heart failure exacerbation vs PNA vs less likely PE. Patient does have signs and symptoms concerning for CHF exacerbation, however does not have a formal diagnosis of CHF. Endorses DOE, new orthopnea, BLE edema, and JVD elevation. Lungs without crackles on my exam and CXR with trace pleural effusions and interstitial edema. BNP elevated to 332. Possible etiologies include infiltrative (amyloid vs sarcoid) or infectious (viral) or hypertensive or valvular disease. Does have some cough and congestion today, but does not report a history of a cold/virus. CXR does show some multifocal patchy opacities, particularly in the lower lobes, that may be concerning for multifocal pneumonia, however patient does not have other signs of infectious etiology. Denies fevers, has a mild cough and some congestion but the shortness of breath seems to have come on all of a sudden. Patient did receive ceftriaxone in the ED. Patient is not hypoxic, tachypneic, or tachycardic, so suspicion for PE is low. Has bilateral lower  extremity edema with left somewhat larger than right, but patient states that this is chronic and denies calf tenderness. No recent travel or surgeries. - echo - TSH - IV lasix 40mg  x1, will reassess after - daily weights - strict I/Os - procalcitonin - CBC tomorrow AM - repeat EKG tomorrow AM  # Lower extremity edema Patient noted new onset bilateral lower extremity edema on Thursday. DDx: heart failure exacerbation vs amlodipine vs nephrotic Patient has elevated JVD, BLE edema, shortness of breath, and new onset orthopnea, concerning for heart failure. Will further evaluate, as above. Per chart review, patient has been on amlodipine since 2013. However, DHP calcium channel blockers are known to cause peripheral edema, so we will hold this for now. With worsening renal function (Cr has been slowly creeping up for the last year), his symptoms are also concerning for nephrotic syndrome. Prior labs indicate hyperlipidemia and hypoalbuminemia. Possible etiologies diabetes vs amyloid  vs FSGS. Per chart review, had a renal U/S and renal biopsy ordered in February, but this was never done. - HF eval, as above - urine protein - urine creatinine - CMP tomorrow AM - hold home amlodipine  # Chronic anemia Hgb 10.0 on admission. Hgb has slowly been trending downwards for the last 6 months. MCV borderline low. Likely from chronic kidney disease, however will evaluate further with iron studies. - ferritin, iron & TIBC - CBC & CMP tomorrow AM  # HTN - hold home amlodipine for LE edema - continue metoprolol 50mg  qday  # HIV 09/2016 HIV viral load 74, CD4 420. Reports compliance with HIV meds. - continue home triumeq  # DM2 On glargine 40u at home. Last A1c 6.8 on 09/01/2016. - lantus 30u - SSI - CBG monitoring  Diet: HH diabetic diet VTE PPx: Lovenox  Dispo: Admit patient to Observation with expected length of stay less than 2 midnights.  Signed: Colbert Ewing, MD  Internal Medicine,  PGY-1 11/09/2016, 1:06 PM  Pager: Mamie Nick [phone]

## 2016-11-09 NOTE — ED Provider Notes (Signed)
[facility] DEPT Provider Note   CSN: [id number] Arrival date & time: 11/09/16  5366     History   Chief Complaint Chief Complaint  Patient presents with  . Shortness of Breath  . Cough  . Leg Swelling    HPI [name] [name] is a 46 y.o. male.  The history is provided by the patient. No language interpreter was used.  Shortness of Breath  Associated symptoms include cough.  Cough  Associated symptoms include shortness of breath.   [name] [name] is a 46 y.o. male who presents to the Emergency Department complaining of sob.  He presents for evaluation of shortness of breath that began last night. He has a history of HIV, diabetes, hypertension. Over the last 3-4 days he has noted bilateral lower extremity edema and last night developed sudden shortness of breath. Shortness of breath significantly worsened this morning and he brought himself to the emergency department. He has been diaphoretic since his shortness of breath began. His cough is productive with occasional vomiting. He denies any fevers, chest pain, abdominal pain. He does feel that he has a chest cold and congestion but this does not feel like a cold. Symptoms are severe, constant, worsening . Past Medical History:  Diagnosis Date  . Depression   . Diabetes mellitus    type II 2006  . Hepatitis B infection    hx of hepatitis infection  . HIV infection ([place])   . Hyperlipidemia   . Hypertension   . Syphilis     Patient Active Problem List   Diagnosis Date Noted  . Other microscopic hematuria 09/01/2016  . S/P left knee surgery 09/03/2015  . CKD (chronic kidney disease) stage 3, GFR 30-59 ml/min 03/07/2015  . Adult acne 02/17/2015  . 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 Syphilis 06/08/2013  . Health care maintenance 10/01/2011  . Human immunodeficiency virus (HIV) disease ([place]) 02/24/2006  . Hyperlipidemia  02/24/2006  . Essential hypertension 02/24/2006    History reviewed. No pertinent surgical history.     Home Medications    Prior to Admission medications   Medication Sig Start Date End Date Taking? Authorizing Provider  amLODipine (NORVASC) 10 MG tablet Take 1 tablet (10 mg total) by mouth daily. 09/01/16   Lorella Nimrod, MD  glucose blood test strip 1 each by Other route 2 (two) times daily. One touch verio iq.  And lancets 2/day 09/03/15   Rivet, Sindy Guadeloupe, MD  Insulin Glargine (BASAGLAR KWIKPEN) 100 UNIT/ML SOPN Inject 0.35 mLs (35 Units total) into the skin daily. 09/08/16   Shela Leff, MD  Insulin Pen Needle (B-D UF III MINI PEN NEEDLES) 31G X 5 MM MISC Use to inject Lantus once daily 08/19/16   Bartholomew Crews, MD  labetalol (NORMODYNE) 300 MG tablet Take 300 mg by mouth 2 (two) times daily.    [provider]  metoprolol succinate (TOPROL-XL) 50 MG 24 hr tablet Take 50 mg by mouth daily. Take with or immediately following a meal.    [provider]  [facility] DELICA LANCETS MISC Use to check blood sugar up to 3 times daily Dx code: 250.00 09/06/10   Rosalia Hammers, MD  [place] 600-50-300 MG tablet TAKE ONE TABLET BY MOUTH ONCE DAILY. STORE IN ORIGINAL BOTTLE AT [facility]. 01/04/16   Campbell Riches, MD    Family History Family History  Problem Relation Age of Onset  . Coronary artery disease  Maternal Grandfather   . Kidney disease Mother   . Diabetes Mother   . Heart disease Mother   . Kidney disease Father   . Diabetes Maternal Aunt   . Diabetes Maternal Uncle   . Diabetes Maternal Grandmother     Social History Social History  Substance Use Topics  . Smoking status: Never Smoker  . Smokeless tobacco: Never Used  . Alcohol use No     Allergies   Patient has no known allergies.   Review of Systems Review of Systems  Respiratory: Positive for cough and shortness of breath.   All other systems reviewed and are  negative.    Physical Exam Updated Vital Signs BP (!) 187/103 (BP Location: Right Arm)   Pulse 84   Temp 97.6 F (36.4 C) (Oral)   Resp 17   Ht 6' (1.829 m)   Wt 104.3 kg (230 lb)   SpO2 94%   BMI 31.19 kg/m   Physical Exam  Constitutional: He is oriented to person, place, and time. He appears well-developed and well-nourished. He appears distressed.  HENT:  Head: Normocephalic and atraumatic.  Cardiovascular: Normal rate and regular rhythm.   No murmur heard. Pulmonary/Chest: Effort normal. No respiratory distress.  Occasional rhonchi bilaterally  Abdominal: Soft. There is no tenderness. There is no rebound and no guarding.  Musculoskeletal: He exhibits no tenderness.  3+ pitting edema to bilateral lower extremities  Neurological: He is alert and oriented to person, place, and time.  Skin: Skin is warm. He is diaphoretic.  Psychiatric: He has a normal mood and affect. His behavior is normal.  Nursing note and vitals reviewed.    ED Treatments / Results  Labs (all labs ordered are listed, but only abnormal results are displayed) Labs Reviewed  BASIC METABOLIC PANEL  CBC  BRAIN NATRIURETIC PEPTIDE  I-STAT TROPONIN, ED  I-STAT CHEM 8, ED    EKG  EKG Interpretation None       Radiology No results found.  Procedures Procedures (including critical care time)  Medications Ordered in ED Medications  nitroGLYCERIN (NITROSTAT) SL tablet 0.4 mg (not administered)     Initial Impression / Assessment and Plan / ED Course  I have reviewed the triage vital signs and the nursing notes.  Pertinent labs & imaging results that were available during my care of the patient were reviewed by me and considered in my medical decision making (see chart for details).     Patient here with lower extremity edema and increasing shortness of breath. He is edematous on examination with diaphoresis on initial evaluation. Following administration of supplemental oxygen is  diaphoresis and shortness of breath improved. Chest x-ray concerning for possible pneumonia he was given Rocephin. BMP demonstrates mild worsening in his renal disease. Concern for element of CHF given his symptoms. Medicine consulted for admission for further treatment.  Final Clinical Impressions(s) / ED Diagnoses   Final diagnoses:  Shortness of breath    New Prescriptions New Prescriptions   No medications on file     Quintella Reichert, MD 11/09/16 [phone]

## 2016-11-09 NOTE — Progress Notes (Signed)
Received report from [place], rn in ed.

## 2016-11-09 NOTE — ED Triage Notes (Signed)
Pt. Stated, I started having a cough on Thursday and SOB and my feet and legs are swelling. Denies any pain.

## 2016-11-10 ENCOUNTER — Observation Stay (HOSPITAL_BASED_OUTPATIENT_CLINIC_OR_DEPARTMENT_OTHER): Payer: BC Managed Care – PPO

## 2016-11-10 DIAGNOSIS — F329 Major depressive disorder, single episode, unspecified: Secondary | ICD-10-CM | POA: Diagnosis present

## 2016-11-10 DIAGNOSIS — E1122 Type 2 diabetes mellitus with diabetic chronic kidney disease: Secondary | ICD-10-CM

## 2016-11-10 DIAGNOSIS — I340 Nonrheumatic mitral (valve) insufficiency: Secondary | ICD-10-CM

## 2016-11-10 DIAGNOSIS — I5033 Acute on chronic diastolic (congestive) heart failure: Secondary | ICD-10-CM

## 2016-11-10 DIAGNOSIS — Z21 Asymptomatic human immunodeficiency virus [HIV] infection status: Secondary | ICD-10-CM | POA: Diagnosis not present

## 2016-11-10 DIAGNOSIS — Z794 Long term (current) use of insulin: Secondary | ICD-10-CM | POA: Diagnosis not present

## 2016-11-10 DIAGNOSIS — D631 Anemia in chronic kidney disease: Secondary | ICD-10-CM | POA: Diagnosis present

## 2016-11-10 DIAGNOSIS — E1121 Type 2 diabetes mellitus with diabetic nephropathy: Secondary | ICD-10-CM | POA: Diagnosis not present

## 2016-11-10 DIAGNOSIS — R0602 Shortness of breath: Secondary | ICD-10-CM | POA: Diagnosis present

## 2016-11-10 DIAGNOSIS — E785 Hyperlipidemia, unspecified: Secondary | ICD-10-CM | POA: Diagnosis present

## 2016-11-10 DIAGNOSIS — B1910 Unspecified viral hepatitis B without hepatic coma: Secondary | ICD-10-CM | POA: Diagnosis present

## 2016-11-10 DIAGNOSIS — N184 Chronic kidney disease, stage 4 (severe): Secondary | ICD-10-CM | POA: Diagnosis present

## 2016-11-10 DIAGNOSIS — I361 Nonrheumatic tricuspid (valve) insufficiency: Secondary | ICD-10-CM

## 2016-11-10 DIAGNOSIS — D649 Anemia, unspecified: Secondary | ICD-10-CM | POA: Diagnosis not present

## 2016-11-10 DIAGNOSIS — Z79899 Other long term (current) drug therapy: Secondary | ICD-10-CM | POA: Diagnosis not present

## 2016-11-10 DIAGNOSIS — I130 Hypertensive heart and chronic kidney disease with heart failure and stage 1 through stage 4 chronic kidney disease, or unspecified chronic kidney disease: Principal | ICD-10-CM

## 2016-11-10 DIAGNOSIS — I5030 Unspecified diastolic (congestive) heart failure: Secondary | ICD-10-CM | POA: Diagnosis not present

## 2016-11-10 DIAGNOSIS — N049 Nephrotic syndrome with unspecified morphologic changes: Secondary | ICD-10-CM | POA: Diagnosis present

## 2016-11-10 DIAGNOSIS — N179 Acute kidney failure, unspecified: Secondary | ICD-10-CM | POA: Diagnosis present

## 2016-11-10 DIAGNOSIS — B20 Human immunodeficiency virus [HIV] disease: Secondary | ICD-10-CM | POA: Diagnosis present

## 2016-11-10 LAB — ECHOCARDIOGRAM COMPLETE
Height: 72 in
Weight: 3796.8 oz

## 2016-11-10 LAB — CBC
HEMATOCRIT: 31.4 % — AB (ref 39.0–52.0)
HEMOGLOBIN: 9.6 g/dL — AB (ref 13.0–17.0)
MCH: 24.1 pg — AB (ref 26.0–34.0)
MCHC: 30.6 g/dL (ref 30.0–36.0)
MCV: 78.9 fL (ref 78.0–100.0)
Platelets: 279 10*3/uL (ref 150–400)
RBC: 3.98 MIL/uL — AB (ref 4.22–5.81)
RDW: 15.5 % (ref 11.5–15.5)
WBC: 6.3 10*3/uL (ref 4.0–10.5)

## 2016-11-10 LAB — GLUCOSE, CAPILLARY
GLUCOSE-CAPILLARY: 105 mg/dL — AB (ref 65–99)
Glucose-Capillary: 93 mg/dL (ref 65–99)
Glucose-Capillary: 149 mg/dL — ABNORMAL HIGH (ref 65–99)
Glucose-Capillary: 153 mg/dL — ABNORMAL HIGH (ref 65–99)
Glucose-Capillary: 132 mg/dL — ABNORMAL HIGH (ref 65–99)

## 2016-11-10 LAB — COMPREHENSIVE METABOLIC PANEL
ALK PHOS: 49 U/L (ref 38–126)
ALT: 24 U/L (ref 17–63)
ANION GAP: 7 (ref 5–15)
AST: 28 U/L (ref 15–41)
Albumin: 2.1 g/dL — ABNORMAL LOW (ref 3.5–5.0)
BUN: 27 mg/dL — ABNORMAL HIGH (ref 6–20)
CALCIUM: 8.1 mg/dL — AB (ref 8.9–10.3)
CO2: 23 mmol/L (ref 22–32)
Chloride: 108 mmol/L (ref 101–111)
Creatinine, Ser: 3.44 mg/dL — ABNORMAL HIGH (ref 0.61–1.24)
GFR calc non Af Amer: 20 mL/min — ABNORMAL LOW (ref >60)
GFR, EST AFRICAN AMERICAN: 23 mL/min — AB (ref >60)
Glucose, Bld: 85 mg/dL (ref 65–99)
POTASSIUM: 3.3 mmol/L — AB (ref 3.5–5.1)
SODIUM: 138 mmol/L (ref 135–145)
TOTAL PROTEIN: 7 g/dL (ref 6.5–8.1)
Total Bilirubin: 0.5 mg/dL (ref 0.3–1.2)

## 2016-11-10 MED ORDER — DOXAZOSIN MESYLATE 2 MG PO TABS
2.0000 mg | ORAL_TABLET | Freq: Every day | ORAL | Status: DC
  Administered 2016-11-10: 2 mg via ORAL
  Filled 2016-11-10 (×2): qty 1

## 2016-11-10 MED ORDER — HYDRALAZINE HCL 10 MG PO TABS
10.0000 mg | ORAL_TABLET | Freq: Once | ORAL | Status: AC
  Administered 2016-11-10: 10 mg via ORAL
  Filled 2016-11-10: qty 1

## 2016-11-10 MED ORDER — FUROSEMIDE 10 MG/ML IJ SOLN
20.0000 mg | Freq: Two times a day (BID) | INTRAMUSCULAR | Status: DC
  Administered 2016-11-10: 20 mg via INTRAVENOUS
  Filled 2016-11-10: qty 2

## 2016-11-10 MED ORDER — AMLODIPINE BESYLATE 10 MG PO TABS
10.0000 mg | ORAL_TABLET | Freq: Every day | ORAL | Status: DC
  Administered 2016-11-10 – 2016-11-11 (×3): 10 mg via ORAL
  Filled 2016-11-10 (×3): qty 1

## 2016-11-10 NOTE — Progress Notes (Signed)
Subjective:  No acute events overnight. Mr. Riviello was seen lying in bed comfortably. He reports his breathing is improved and his lower extremity swelling is improved as well. Net negative 3L with lasix. Denies chest pain. Plan today for echo.  Objective:  Vital signs in last 24 hours: Vitals:   11/10/16 0101 11/10/16 0417 11/10/16 0431 11/10/16 1338  BP: (!) 179/98 (!) 168/96  (!) 198/99  Pulse: 83 81  79  Resp:  16  16  Temp:  98.4 F (36.9 C)  98.1 F (36.7 C)  TempSrc:    Oral  SpO2: 99% 98%  99%  Weight:   237 lb 4.8 oz (107.6 kg)   Height:       GEN: Well-appearing AA male lying in bed comfortably in NAD RESP: Crackles in right lower lung field > left CV: Normal rate and regular rhythm. No murmurs, gallops, or rubs. 1+ LE edema. ABD: Soft. Non-tender. Somewhat distended (patient states his whole body has been more swollen in the last few days). Normoactive bowel sounds. EXT: 1+ LE edema. Warm. 2+ DP pulses.  Labs CBC Latest Ref Rng & Units 11/10/2016 11/09/2016 11/09/2016  WBC 4.0 - 10.5 K/uL 6.3 - 7.0  Hemoglobin 13.0 - 17.0 g/dL 9.6(L) 10.5(L) 10.0(L)  Hematocrit 39.0 - 52.0 % 31.4(L) 31.0(L) 32.7(L)  Platelets 150 - 400 K/uL 279 - 291   CMP Latest Ref Rng & Units 11/10/2016 11/09/2016 11/09/2016  Glucose 65 - 99 mg/dL 85 82 81  BUN 6 - 20 mg/dL 27(H) 30(H) 27(H)  Creatinine 0.61 - 1.24 mg/dL 3.44(H) 3.10(H) 3.07(H)  Sodium 135 - 145 mmol/L 138 144 137  Potassium 3.5 - 5.1 mmol/L 3.3(L) 3.6 3.4(L)  Chloride 101 - 111 mmol/L 108 111 110  CO2 22 - 32 mmol/L 23 - 22  Calcium 8.9 - 10.3 mg/dL 8.1(L) - 8.2(L)  Total Protein 6.5 - 8.1 g/dL 7.0 - -  Total Bilirubin 0.3 - 1.2 mg/dL 0.5 - -  Alkaline Phos 38 - 126 U/L 49 - -  AST 15 - 41 U/L 28 - -  ALT 17 - 63 U/L 24 - -   UCr 78 UProt 440 Iron 47, TIBC 340, ferritin 49 TSH normal PCT <0.1  Assessment/Plan:  Active Problems:   Shortness of breath  Mr. Thon is a 46yo male with PMH significant for HTN, DM2,  and HIV who presents on 8/26 with volume overload secondary to nephrotic syndrome in setting of HIV and HTN. Responding well to diuresis with lasix.  # Shortness of breath and lower extremity edema Net negative 3L with lasix yesterday. Reports that his shortness of breath and LE edema have improved. Was seen lying flat this morning on RA in NAD. No formal diagnosis of CHF. TSH normal. PCT normal. Possible etiologies include CHF from infiltrative (amyloid vs sarcoid) or infectious (viral) or hypertensive or valvular disease. Exacerbated by fluid overload from worsening renal function. [ ]  f/u echo - IV lasix 20mg  BID - daily weights - strict I/Os - BMET tomorrow AM  # Nephrotic range proteinuria UProt:Cr ratio suggests nephrotic range proteinuria. New onset bilateral lower extremity edema on Thursday. Possible etiologies diabetes vs amyloid vs FSGS vs HIVAN. Evaluating heart failure, as above. Will restart amlodipine given that he has been on this for years and this is unlikely to be the cause of his lower extremity edema. Patient was supposed to get a renal biopsy outpatient on 8/3, but this was canceled due to elevated BP. - HF  eval, as above - Nephrology consulted for evaluation for renal biopsy; appreciate their assistance - BMET tomorrow AM - hepatitis panel  # Chronic anemia Hgb 9.6 this morning. Hgb has slowly been trending downwards for the last 6 months. MCV borderline low. Ferritin, iron, and TIBC normal. Likely from chronic kidney disease. - Will continue to monitor  # HTN Blood pressure has been elevated in the hospital. - restart home amlodipine 10mg  qday - continue labetalol 300mg  BID - IV lasix 20mg  BID - Consider adding hydralazine or diltiazem if needed  # HIV 09/2016 HIV viral load 74, CD4 420. Reports compliance with HIV meds. - continue home triumeq - hepatitis panel  # DM2 On glargine 40u at home. Last A1c 6.8 on 09/01/2016. - lantus 30u - SSI - CBG  monitoring  Diet: HH diabetic diet VTE PPx: Lovenox  Dispo: Anticipated discharge in approximately 1-2 day(s).  Colbert Ewing, MD 11/10/2016, 3:31 PM Pager: Mamie Nick [phone]

## 2016-11-10 NOTE — Progress Notes (Signed)
  Echocardiogram 2D Echocardiogram has been performed.  [name] [name] T Damarys Speir 11/10/2016, 12:44 PM

## 2016-11-10 NOTE — Consult Note (Signed)
Reason for Consult: Acute kidney injury on chronic kidney disease stage IV, Nephrotic syndrome Referring Physician: Oval Linsey M.D. ([facility])  HPI:  46 year old African-American man with past medical history significant for HIV infection since 1997, hepatitis B infection, remote history of syphilis and a long-standing history of poorly controlled hypertension and diabetes. He was originally seen by Dr.Coladonato at [facility] on 09/25/16 for concerns with progressive chronic kidney disease. At that time, his creatinine was 2.9. Because of nephrotic range proteinuria and concerns with his decline in GFR, plans were placed for renal biopsy however, this was deferred because of poorly controlled blood pressures.  He presented to the emergency room yesterday with increasing lower extremity edema and shortness of breath upon laying flat that did not improve with sitting up. Overnight on just a small dose of intravenous furosemide 20 mg twice a day, he has put out 3.3 L of urine with significant clinical improvement of his shortness of breath as well as subjective improvement of his edema.Concern is raised with his rising creatinine that is now up to 3.4 as well as the correct timing of renal biopsy.  He denies any nausea, vomiting or diarrhea prior to admission and states one episode of vomiting in the emergency room. He denies the use of any nonsteroidal anti-inflammatory drugs. He denies any recent antibiotic exposure. He reports that he was coughing up sputum that tasted like blood but no obvious hemoptysis. Denied any fever or chills.  Strong family history of chronic kidney disease:both his mother and grandmother have end-stage renal disease (HTN/DM). 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  Past Medical History:  Diagnosis Date  . Depression   . Diabetes mellitus    type II 2006  . Hepatitis B infection    hx of hepatitis  infection  . HIV infection ([place])   . Hyperlipidemia   . Hypertension   . Syphilis     History reviewed. No pertinent surgical history.  Family History  Problem Relation Age of Onset  . Coronary artery disease Maternal Grandfather   . Kidney disease Mother   . Diabetes Mother   . Heart disease Mother   . Kidney disease Father   . Diabetes Maternal Aunt   . Diabetes Maternal Uncle   . Diabetes Maternal Grandmother     Social History:  reports that he has never smoked. He has never used smokeless tobacco. He reports that he does not drink alcohol or use drugs.  Allergies: No Known Allergies  Medications:  Scheduled: . abacavir-dolutegravir-lamiVUDine  1 tablet Oral Q24H  . amLODipine  10 mg Oral Daily  . enoxaparin (LOVENOX) injection  40 mg Subcutaneous Q24H  . furosemide  20 mg Intravenous BID  . insulin aspart  0-9 Units Subcutaneous TID WC  . insulin glargine  30 Units Subcutaneous QHS  . labetalol  300 mg Oral BID  . potassium chloride  20 mEq Oral Daily    BMP Latest Ref Rng & Units 11/10/2016 11/09/2016 11/09/2016  Glucose 65 - 99 mg/dL 85 82 81  BUN 6 - 20 mg/dL 27(H) 30(H) 27(H)  Creatinine 0.61 - 1.24 mg/dL 3.44(H) 3.10(H) 3.07(H)  BUN/Creat Ratio 9 - 20 - - -  Sodium 135 - 145 mmol/L 138 144 137  Potassium 3.5 - 5.1 mmol/L 3.3(L) 3.6 3.4(L)  Chloride 101 - 111 mmol/L 108 111 110  CO2 22 - 32 mmol/L 23 - 22  Calcium 8.9 - 10.3  mg/dL 8.1(L) - 8.2(L)   CBC Latest Ref Rng & Units 11/10/2016 11/09/2016 11/09/2016  WBC 4.0 - 10.5 K/uL 6.3 - 7.0  Hemoglobin 13.0 - 17.0 g/dL 9.6(L) 10.5(L) 10.0(L)  Hematocrit 39.0 - 52.0 % 31.4(L) 31.0(L) 32.7(L)  Platelets 150 - 400 K/uL 279 - 291    Dg Chest 2 View  Result Date: 11/09/2016 CLINICAL DATA:  Shortness of breath, cough EXAM: CHEST  2 VIEW COMPARISON:  11/09/2016 FINDINGS: Mild patchy bilateral lower lobe opacities. Possible right suprahilar opacity. Suspected trace bilateral pleural effusions. This appearance is  worrisome for multifocal pneumonia, although mild interstitial edema is considered less likely. The heart is normal in size. IMPRESSION: Multifocal patchy opacities, lower lobe predominant, suspicious for multifocal pneumonia. Mild interstitial edema is considered less likely. Trace bilateral pleural effusions. Electronically Signed   By: Julian Hy M.D.   On: 11/09/2016 11:44   Dg Chest Port 1 View  Result Date: 11/09/2016 CLINICAL DATA:  Cough and shortness of breath. EXAM: PORTABLE CHEST 1 VIEW COMPARISON:  None. FINDINGS: 0739 hours. The cardiopericardial silhouette is within normal limits for size. Bibasilar atelectasis. Right infrahilar opacity may be atelectatic or related to pneumonia. The visualized bony structures of the thorax are intact. IMPRESSION: Right infrahilar airspace disease compatible with atelectasis or pneumonia. Electronically Signed   By: Misty Stanley M.D.   On: 11/09/2016 08:00    Review of Systems  Constitutional: Positive for malaise/fatigue. Negative for chills and fever.  HENT: Negative.   Eyes: Negative.   Respiratory: Positive for cough, sputum production and shortness of breath. Negative for hemoptysis and wheezing.   Cardiovascular: Positive for palpitations, orthopnea and leg swelling. Negative for chest pain and claudication.  Gastrointestinal: Positive for nausea and vomiting. Negative for abdominal pain and diarrhea.       One episode in the emergency room-posttussive  Genitourinary: Negative.   Musculoskeletal: Negative.   Skin: Negative.   Neurological: Negative for weakness.   Blood pressure (!) 198/99, pulse 79, temperature 98.1 F (36.7 C), temperature source Oral, resp. rate 16, height 6' (1.829 m), weight 107.6 kg (237 lb 4.8 oz), SpO2 99 %. Physical Exam  Nursing note and vitals reviewed. Constitutional: He is oriented to person, place, and time. He appears well-developed and well-nourished. No distress.  HENT:  Head: Normocephalic and  atraumatic.  Left Ear: External ear normal.  Mouth/Throat: Oropharynx is clear and moist.  Eyes: Pupils are equal, round, and reactive to light. Conjunctivae and EOM are normal. No scleral icterus.  Neck: Normal range of motion. Neck supple. No JVD present.  Cardiovascular: Normal rate, regular rhythm and normal heart sounds.   No murmur heard. Respiratory: Effort normal. He has no wheezes. He has no rales.  Coarse breath sounds bilaterally without any distinct rales or rhonchi  GI: Soft. Bowel sounds are normal. There is no tenderness. There is no rebound and no guarding.  Musculoskeletal: Normal range of motion. He exhibits edema.  Trace ankle edema left leg, 1+ edema right leg  Neurological: He is alert and oriented to person, place, and time. No cranial nerve deficit.  Skin: Skin is warm and dry. No rash noted. No erythema.  Psychiatric: He has a normal mood and affect.    Assessment/Plan: 1. Acute kidney injury and chronic kidney disease stage IV: Most likely hemodynamically mediated with nephrotic syndrome/third spacing and now diuretic use with altered renal perfusion. I expect renal function to start showing improvement as we achieve euvolemic state. Recently done serologies do not show  evidence of acute GN-ANCA testing was negative although he has anti-myeloperoxidase antibody. We'll continue efforts at blood pressure control at this time to try and optimize him for renal biopsy-this may or may not be done at the current hospitalization given his desire to leave the hospital sooner than later and the importance of cautious blood pressure lowering. 2. Nephrotic syndrome: Multiple possible etiologies given prior history of HIV infection, hepatitis B, syphilis and poorly controlled diabetes. 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 We'll order a renal  biopsy based on blood pressure control.  3. Orthopnea/edema: Secondary to nephrotic syndrome/volume overload/salt retention-improving on current low-dose furosemide. 4. Hypertension: suboptimal control on amlodipine 10 mg daily and labetalol 300 mg twice a day. Recently started on furosemide for volume unloading- highly desirable to start him on RAS blocking agents or non-dihydropyridine calcium channel blocker (however, the former is limited by acute kidney injury and the latter is limited by his heart rate). May need to start additional vasodilator such as doxazosin (which may be limited by worsening pedal edema).  [name] [name] K. 11/10/2016, 3:16 PM

## 2016-11-11 ENCOUNTER — Telehealth: Payer: Self-pay

## 2016-11-11 DIAGNOSIS — R809 Proteinuria, unspecified: Secondary | ICD-10-CM

## 2016-11-11 DIAGNOSIS — I5030 Unspecified diastolic (congestive) heart failure: Secondary | ICD-10-CM

## 2016-11-11 LAB — BASIC METABOLIC PANEL
ANION GAP: 7 (ref 5–15)
ANION GAP: 5 (ref 5–15)
BUN: 26 mg/dL — ABNORMAL HIGH (ref 6–20)
BUN: 27 mg/dL — ABNORMAL HIGH (ref 6–20)
CALCIUM: 8.2 mg/dL — AB (ref 8.9–10.3)
CHLORIDE: 108 mmol/L (ref 101–111)
CO2: 25 mmol/L (ref 22–32)
CO2: 25 mmol/L (ref 22–32)
CREATININE: 3.49 mg/dL — AB (ref 0.61–1.24)
Calcium: 8.3 mg/dL — ABNORMAL LOW (ref 8.9–10.3)
Chloride: 108 mmol/L (ref 101–111)
Creatinine, Ser: 3.63 mg/dL — ABNORMAL HIGH (ref 0.61–1.24)
GFR, EST AFRICAN AMERICAN: 21 mL/min — AB (ref >60)
GFR, EST AFRICAN AMERICAN: 22 mL/min — AB (ref >60)
GFR, EST NON AFRICAN AMERICAN: 18 mL/min — AB (ref >60)
GFR, EST NON AFRICAN AMERICAN: 19 mL/min — AB (ref >60)
GLUCOSE: 135 mg/dL — AB (ref 65–99)
Glucose, Bld: 85 mg/dL (ref 65–99)
POTASSIUM: 3.5 mmol/L (ref 3.5–5.1)
Potassium: 3.4 mmol/L — ABNORMAL LOW (ref 3.5–5.1)
SODIUM: 140 mmol/L (ref 135–145)
Sodium: 138 mmol/L (ref 135–145)

## 2016-11-11 LAB — HEPATITIS B SURFACE AG, CONFIRM: HBSAG CONFIRMATION: POSITIVE — AB

## 2016-11-11 LAB — PROCALCITONIN: Procalcitonin: <0.1 ng/mL

## 2016-11-11 LAB — HEPATITIS B CORE ANTIBODY, TOTAL: Hep B Core Total Ab: NEGATIVE

## 2016-11-11 LAB — GLUCOSE, CAPILLARY
GLUCOSE-CAPILLARY: 82 mg/dL (ref 65–99)
Glucose-Capillary: 144 mg/dL — ABNORMAL HIGH (ref 65–99)
Glucose-Capillary: 100 mg/dL — ABNORMAL HIGH (ref 65–99)

## 2016-11-11 LAB — HCV COMMENT:

## 2016-11-11 LAB — HEPATITIS C ANTIBODY (REFLEX): HCV Ab: 0.2 s/co ratio (ref 0.0–0.9)

## 2016-11-11 LAB — HEPATITIS B SURFACE ANTIGEN

## 2016-11-11 LAB — HEPATITIS B SURFACE ANTIBODY,QUALITATIVE: Hep B S Ab: NONREACTIVE

## 2016-11-11 MED ORDER — VERAPAMIL HCL ER 240 MG PO TBCR
240.0000 mg | EXTENDED_RELEASE_TABLET | Freq: Every day | ORAL | 0 refills | Status: DC
Start: 2016-11-12 — End: 2017-01-11

## 2016-11-11 MED ORDER — FUROSEMIDE 40 MG PO TABS
40.0000 mg | ORAL_TABLET | Freq: Every day | ORAL | Status: DC
  Filled 2016-11-11: qty 1

## 2016-11-11 MED ORDER — LABETALOL HCL 200 MG PO TABS
400.0000 mg | ORAL_TABLET | Freq: Two times a day (BID) | ORAL | Status: DC
  Filled 2016-11-11: qty 2

## 2016-11-11 MED ORDER — VERAPAMIL HCL ER 240 MG PO TBCR
240.0000 mg | EXTENDED_RELEASE_TABLET | Freq: Every day | ORAL | Status: DC
  Administered 2016-11-11: 240 mg via ORAL
  Filled 2016-11-11: qty 1

## 2016-11-11 MED ORDER — VERAPAMIL HCL ER 120 MG PO TBCR: 120.0000 mg | EXTENDED_RELEASE_TABLET | Freq: Once | ORAL | Status: DC

## 2016-11-11 MED ORDER — DOXAZOSIN MESYLATE 2 MG PO TABS: 2.0000 mg | ORAL_TABLET | Freq: Every day | ORAL | 0 refills | Status: DC

## 2016-11-11 MED ORDER — VERAPAMIL HCL ER 180 MG PO TBCR: 360.0000 mg | EXTENDED_RELEASE_TABLET | Freq: Every day | ORAL | Status: DC

## 2016-11-11 MED ORDER — POTASSIUM CHLORIDE CRYS ER 20 MEQ PO TBCR: 20.0000 meq | EXTENDED_RELEASE_TABLET | Freq: Every day | ORAL | 0 refills | Status: DC

## 2016-11-11 MED ORDER — FUROSEMIDE 20 MG PO TABS: 20.0000 mg | ORAL_TABLET | Freq: Two times a day (BID) | ORAL | Status: DC

## 2016-11-11 MED ORDER — FUROSEMIDE 20 MG PO TABS: 20.0000 mg | ORAL_TABLET | Freq: Two times a day (BID) | ORAL | 0 refills | Status: DC

## 2016-11-11 MED ORDER — FUROSEMIDE 40 MG PO TABS
40.0000 mg | ORAL_TABLET | Freq: Two times a day (BID) | ORAL | Status: DC
  Administered 2016-11-11: 40 mg via ORAL

## 2016-11-11 MED ORDER — FUROSEMIDE 20 MG PO TABS
20.0000 mg | ORAL_TABLET | Freq: Two times a day (BID) | ORAL | Status: DC
  Administered 2016-11-11: 20 mg via ORAL
  Filled 2016-11-11: qty 1

## 2016-11-11 MED ORDER — FUROSEMIDE 40 MG PO TABS: 40.0000 mg | ORAL_TABLET | Freq: Two times a day (BID) | ORAL | Status: DC

## 2016-11-11 NOTE — Progress Notes (Signed)
Subjective:  No acute events overnight. [name] [name] was seen lying in bed comfortably. He reports his breathing and lower extremity swelling are improved. Net negative 3L since yesterday with IV lasix 20 BID. Denies chest pain.  Echo showed grade 2 DD, EF 55-60%, PA peak pressure 18mmHg, LA moderately dilated.  Objective:  Vital signs in last 24 hours: Vitals:   11/10/16 1338 11/10/16 1914 11/10/16 2138 11/11/16 0515  BP: (!) 198/99 (!) 175/91 (!) 141/57 (!) 177/98  Pulse: 79 83 63 80  Resp: 16   16  Temp: 98.1 F (36.7 C)  98.3 F (36.8 C) 98.1 F (36.7 C)  TempSrc: Oral  Oral Oral  SpO2: 99%  98% 96%  Weight:    230 lb 8 oz (104.6 kg)  Height:       GEN: Well-appearing, pleasant AA male lying in bed comfortably in NAD RESP: Clear to auscultation bilaterally CV: Normal rate and regular rhythm. No murmurs, gallops, or rubs. Trace LE edema. ABD: Soft. Non-tender. Non-distended. Normoactive bowel sounds. EXT: Trace LE edema, L > R (chronic); vertical scar on left knee  Labs CBC Latest Ref Rng & Units 11/10/2016 11/09/2016 11/09/2016  WBC 4.0 - 10.5 K/uL 6.3 - 7.0  Hemoglobin 13.0 - 17.0 g/dL 9.6(L) 10.5(L) 10.0(L)  Hematocrit 39.0 - 52.0 % 31.4(L) 31.0(L) 32.7(L)  Platelets 150 - 400 K/uL 279 - 291   CMP Latest Ref Rng & Units 11/11/2016 11/10/2016 11/09/2016  Glucose 65 - 99 mg/dL 85 85 82  BUN 6 - 20 mg/dL 26(H) 27(H) 30(H)  Creatinine 0.61 - 1.24 mg/dL 3.63(H) 3.44(H) 3.10(H)  Sodium 135 - 145 mmol/L 140 138 144  Potassium 3.5 - 5.1 mmol/L 3.5 3.3(L) 3.6  Chloride 101 - 111 mmol/L 108 108 111  CO2 22 - 32 mmol/L 25 23 -  Calcium 8.9 - 10.3 mg/dL 8.3(L) 8.1(L) -  Total Protein 6.5 - 8.1 g/dL - 7.0 -  Total Bilirubin 0.3 - 1.2 mg/dL - 0.5 -  Alkaline Phos 38 - 126 U/L - 49 -  AST 15 - 41 U/L - 28 -  ALT 17 - 63 U/L - 24 -   HBV SAb neg HBV Cab neg HCV Ab neg  Assessment/Plan:  Principal Problem:   Heart failure with preserved left ventricular function (HFpEF)  (HCC) Active Problems:   Essential hypertension   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 Responding well to diuresis with lasix.  # New onset heart failure with preserved ejection fraction (HFpEF), likely secondary to hypertension Echo with grade 2 DD, EF 55-60%, PA peak pressure 69mmHg, LA moderately dilated. Net negative 3L since yesterday with IV lasix 20mg  BID. Reports that his shortness of breath, LE edema, and abdominal swelling have improved. Exacerbated by fluid overload from worsening renal function. [ ]  f/u BMET 1500 - PO lasix 20mg  BID - daily weights - strict I/Os  # Nephrotic range proteinuria UProt:Cr ratio suggests nephrotic range proteinuria. New onset bilateral lower extremity edema on Thursday. Possible etiologies diabetes vs amyloid vs FSGS vs HIVAN. Hep C negative. Nephrology recommending outpatient renal biopsy when BP under better control. [ ]  f/u Hep B SAg - Nephrology following; appreciate  their assistance - Avoid RAS blocking agents - PO lasix 20mg  BID - f/u BMET 1500. If Cr stable, can discharge home today with outpatient follow-up  # Chronic anemia Hgb 9.6 this morning. Hgb has slowly been trending downwards for the last 6 months. MCV borderline low. Ferritin, iron, and TIBC normal. Likely from chronic kidney disease. - Will continue to monitor  # HTN Blood pressure 157/80. HR 78. - verapamil SR 240mg  qday - PO lasix 20mg  BID - doxazosin 2mg  QHS - d/c labetalol and amlodipine  # HIV 09/2016 HIV viral load 74, CD4 420. Reports compliance with HIV meds. HCV neg [ ]  f/u Hep B SAg - continue home triumeq  # DM2 On  glargine 40u at home. Last A1c 6.8 on 09/01/2016. - lantus 30u - SSI - CBG monitoring  Diet: HH diabetic diet VTE PPx: Lovenox  Dispo: Anticipated discharge in 0-1 day(s).  Colbert Ewing, MD 11/11/2016, 1:49 PM Pager: Mamie Nick [phone]

## 2016-11-11 NOTE — Telephone Encounter (Signed)
Hospital TOC per Dr Ronalee Red, discharge 11/11/2016, appt 11/13/2016 @ 3:45.

## 2016-11-11 NOTE — Discharge Summary (Signed)
Name: [name] [name] MRN: [id number] DOB: 1970/03/19 46 y.o. PCP: Tawny Asal, MD  Date of Admission: 11/09/2016  7:31 AM Date of Discharge: 11/11/2016 Attending Physician: Oval Linsey, MD  Discharge Diagnosis: 1. HFpEF 2. HTN 3. Nephrotic range proteinuria  Discharge Medications: Allergies as of 11/11/2016   No Known Allergies     Medication List    STOP taking these medications   amLODipine 10 MG tablet Commonly known as:  NORVASC   labetalol 300 MG tablet Commonly known as:  NORMODYNE   metoprolol succinate 50 MG 24 hr tablet Commonly known as:  TOPROL-XL     TAKE these medications   BASAGLAR KWIKPEN 100 UNIT/ML Sopn Inject 0.35 mLs (35 Units total) into the skin daily. What changed:  how much to take   doxazosin 2 MG tablet Commonly known as:  CARDURA Take 1 tablet (2 mg total) by mouth at bedtime.   furosemide 20 MG tablet Commonly known as:  LASIX Take 1 tablet (20 mg total) by mouth 2 (two) times daily.   glucose blood test strip 1 each by Other route 2 (two) times daily. One touch verio iq.  And lancets 2/day   Insulin Pen Needle 31G X 5 MM Misc Commonly known as:  B-D UF III MINI PEN NEEDLES Use to inject Lantus once daily   ONETOUCH DELICA LANCETS Misc Use to check blood sugar up to 3 times daily Dx code: 250.00   potassium chloride SA 20 MEQ tablet Commonly known as:  K-DUR,KLOR-CON Take 1 tablet (20 mEq total) by mouth daily.   TRIUMEQ 600-50-300 MG tablet Generic drug:  abacavir-dolutegravir-lamiVUDine TAKE ONE TABLET BY MOUTH ONCE DAILY. STORE IN ORIGINAL BOTTLE AT ROOMTEMPERATURE.   verapamil 240 MG CR tablet Commonly known as:  CALAN-SR Take 1 tablet (240 mg total) by mouth daily.            Discharge Care Instructions        Start     Ordered   11/12/16 0000  furosemide (LASIX) 20 MG tablet  2 times daily     11/11/16 1630   11/12/16 0000  potassium chloride SA (K-DUR,KLOR-CON) 20 MEQ tablet  Daily     11/11/16  1630   11/12/16 0000  verapamil (CALAN-SR) 240 MG CR tablet  Daily     11/11/16 1630   11/11/16 0000  doxazosin (CARDURA) 2 MG tablet  Daily at bedtime     11/11/16 1630   11/11/16 0000  Increase activity slowly     11/11/16 1630   11/11/16 0000  Diet - low sodium heart healthy     11/11/16 1630   11/11/16 0000  Call MD for:  temperature >100.4     11/11/16 1630   11/11/16 0000  Call MD for:  severe uncontrolled pain     11/11/16 1630   11/11/16 0000  Call MD for:  redness, tenderness, or signs of infection (pain, swelling, redness, odor or green/yellow discharge around incision site)     11/11/16 1630   11/11/16 0000  Call MD for:  persistant nausea and vomiting     11/11/16 1630   11/11/16 0000  Call MD for:  persistant dizziness or light-headedness     11/11/16 1630   11/11/16 0000  Call MD for:  difficulty breathing, headache or visual disturbances     11/11/16 1630   11/11/16 0000  (HEART FAILURE PATIENTS) Call MD:  Anytime you have any of the following symptoms: 1) 3 pound weight gain  in 24 hours or 5 pounds in 1 week 2) shortness of breath, with or without a dry hacking cough 3) swelling in the hands, feet or stomach 4) if you have to sleep on extra pillows at night in order to breathe.     11/11/16 1630      Disposition and follow-up:   [name] was discharged from [facility] in Good condition.  At the hospital follow up visit please address:  1.  HFpEF: New diagnosis on echo 8/27. Started on lasix 20mg  BID (dosed BID to help with HTN as well). We encouraged him to take the second dose of lasix before 2pm so that he is not up all night urinating. Is he tolerating lasix?  - Discharged on K-Dur 81mEq for low potassium in hospital. Please discontinue if not needed.  2.  HTN: Elevated BP in the hospital. Home anti-hypertensive regimen adjusted in the hospital due to new HFpEF diagnosis and nephrotic range proteinuria. Patient discharged with verapamil  SR 240mg  qday, doxazosin 2mg  qhs, and lasix 20mg  BID. Avoid RAS blocking agents during acute kidney injury, but can consider starting this outpatient (with significant proteinuria). Can consider increasing verapamil if blood pressure continues to be elevated, if heart rate can tolerate it.  3.  Nephrotic syndrome: Patient needs outpatient renal biopsy once BP under better control. Previously saw Dr. Marval Regal at [facility] on 09/25/16.  4.  Labs / imaging needed at time of follow-up: BMP for K and Cr, BP  5.  Pending labs/ test needing follow-up: Hepatitis B surface antigen  Follow-up Appointments: Follow-up Information    Tawny Asal, MD. Go on 11/13/2016.   Specialty:  Internal Medicine Why:  at 3:45pm Contact information: [place] [address] [phone]        Campbell Riches, MD Follow up.   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 HFpEF, possible etiology hypertension Patient admitted 8/26 with new onset LE edema, DOE, orthopnea, and shortness of breath. CXR with trace pleural effusions and interstitial edema. He responded well to IV diuresis with lasix. Echo showed LVEF 55-60% and grade 2 diastolic dysfunction. Patient had significant symptomatic improvement with IV lasix and was discharged on PO lasix 20mg  BID (dosed BID to help with HTN as well).  2. HTN BP up to 190s/90s in the hospital. Home anti-hypertensive regimen adjusted in the hospital due to new HFpEF diagnosis and  nephrotic range proteinuria. Patient discharged with verapamil SR 240mg  qday, doxazosin 2mg  qhs, and lasix 20mg  BID. Avoid RAS blocking agents during acute kidney injury, but can consider starting this outpatient (with significant proteinuria).  3. Nephrotic syndrome Patient has had slow progressive worsening of his Cr for the last 8 months. Uprot:UCr suggesting nephrotic range proteinuria. Patient was scheduled to have renal biopsy outpatient on 8/3, but this was not able to be done because his blood pressure was too high. Cr stabilized at the time of discharge and edema improved with lasix. Renal function will likely start to improve as he  becomes more euvolemic. Nephrology recommends optimizing blood pressure control and then referral for renal biopsy once BP under better control. Avoid RAS blocking agents during acute phase (although he has significant proteinuria).  4. Normocytic anemia, likely secondary to chronic kidney disease Iron studies normal. Hemoglobin has slowly been trending down for the last 8 months. Hb stable in the hospital. No dizziness or lightheadedness.  Discharge Vitals:   BP (!) 157/80 (BP Location: Right Arm)   Pulse 78   Temp 98.1 F (36.7 C) (Oral)   Resp 18   Ht 6' (1.829 m)   Wt 230 lb 8 oz (104.6 kg)   SpO2 99%   BMI 31.26 kg/m   Pertinent Labs, Studies, and Procedures:  CBC Latest Ref Rng & Units 11/10/2016 11/09/2016 11/09/2016  WBC 4.0 - 10.5 K/uL 6.3 - 7.0  Hemoglobin 13.0 - 17.0 g/dL 9.6(L) 10.5(L) 10.0(L)  Hematocrit 39.0 - 52.0 % 31.4(L) 31.0(L) 32.7(L)  Platelets 150 - 400 K/uL 279 - 291   CMP Latest Ref Rng & Units 11/11/2016 11/11/2016 11/10/2016  Glucose 65 - 99 mg/dL 135(H) 85 85  BUN 6 - 20 mg/dL 27(H) 26(H) 27(H)  Creatinine 0.61 - 1.24 mg/dL 3.49(H) 3.63(H) 3.44(H)  Sodium 135 - 145 mmol/L 138 140 138  Potassium 3.5 - 5.1 mmol/L 3.4(L) 3.5 3.3(L)  Chloride 101 - 111 mmol/L 108 108 108  CO2 22 - 32 mmol/L 25 25 23   Calcium 8.9 - 10.3 mg/dL  8.2(L) 8.3(L) 8.1(L)  Total Protein 6.5 - 8.1 g/dL - - 7.0  Total Bilirubin 0.3 - 1.2 mg/dL - - 0.5  Alkaline Phos 38 - 126 U/L - - 49  AST 15 - 41 U/L - - 28  ALT 17 - 63 U/L - - 24   Hepatitis B core antibody negative Hepatitis B surface antibody negative HCV antibody negative Ur protein 440 Ur creatinine 78.13 Iron 47, TIBC 340, UIBC 293 Ferritin 49 TSH 1.526 BNP 332  Discharge Instructions: Discharge Instructions    (HEART FAILURE PATIENTS) Call MD:  Anytime you have any of the following symptoms: 1) 3 pound weight gain in 24 hours or 5 pounds in 1 week 2) shortness of breath, with or without a dry hacking cough 3) swelling in the hands, feet or stomach 4) if you have to sleep on extra pillows at night in order to breathe.    Complete by:  As directed    Call MD for:  difficulty breathing, headache or visual disturbances    Complete by:  As directed    Call MD for:  persistant dizziness or light-headedness    Complete by:  As directed    Call MD for:  persistant nausea and vomiting    Complete by:  As directed    Call MD for:  redness, tenderness, or signs of infection (pain, swelling, redness, odor or green/yellow discharge around incision site)    Complete by:  As directed    Call MD for:  severe uncontrolled pain    Complete by:  As directed    Call MD for:  temperature >100.4    Complete by:  As directed    Diet - low sodium heart healthy    Complete by:  As directed    Increase activity slowly    Complete by:  As directed       Signed: Colbert Ewing, MD 11/11/2016, 4:31 PM   Pager: Mamie Nick [phone]

## 2016-11-11 NOTE — Progress Notes (Signed)
Nsg Discharge Note  Admit Date:  11/09/2016 Discharge date: 11/11/2016   [name] [name] to be D/C'd Home per MD order.  AVS completed.  Copy for chart, and copy for patient signed, and dated. Patient/caregiver able to verbalize understanding.  Discharge Medication: Allergies as of 11/11/2016   No Known Allergies     Medication List    STOP taking these medications   amLODipine 10 MG tablet Commonly known as:  NORVASC   labetalol 300 MG tablet Commonly known as:  NORMODYNE   metoprolol succinate 50 MG 24 hr tablet Commonly known as:  TOPROL-XL     TAKE these medications   BASAGLAR KWIKPEN 100 UNIT/ML Sopn Inject 0.35 mLs (35 Units total) into the skin daily. What changed:  how much to take   doxazosin 2 MG tablet Commonly known as:  CARDURA Take 1 tablet (2 mg total) by mouth at bedtime.   furosemide 20 MG tablet Commonly known as:  LASIX Take 1 tablet (20 mg total) by mouth 2 (two) times daily.   glucose blood test strip 1 each by Other route 2 (two) times daily. One touch verio iq.  And lancets 2/day   Insulin Pen Needle 31G X 5 MM Misc Commonly known as:  B-D UF III MINI PEN NEEDLES Use to inject Lantus once daily   ONETOUCH DELICA LANCETS Misc Use to check blood sugar up to 3 times daily Dx code: 250.00   potassium chloride SA 20 MEQ tablet Commonly known as:  K-DUR,KLOR-CON Take 1 tablet (20 mEq total) by mouth daily.   TRIUMEQ 600-50-300 MG tablet Generic drug:  abacavir-dolutegravir-lamiVUDine TAKE ONE TABLET BY MOUTH ONCE DAILY. STORE IN ORIGINAL BOTTLE AT ROOMTEMPERATURE.   verapamil 240 MG CR tablet Commonly known as:  CALAN-SR Take 1 tablet (240 mg total) by mouth daily.            Discharge Care Instructions        Start     Ordered   11/12/16 0000  furosemide (LASIX) 20 MG tablet  2 times daily     11/11/16 1630   11/12/16 0000  potassium chloride SA (K-DUR,KLOR-CON) 20 MEQ tablet  Daily     11/11/16 1630   11/12/16 0000   verapamil (CALAN-SR) 240 MG CR tablet  Daily     11/11/16 1630   11/11/16 0000  doxazosin (CARDURA) 2 MG tablet  Daily at bedtime     11/11/16 1630   11/11/16 0000  Increase activity slowly     11/11/16 1630   11/11/16 0000  Diet - low sodium heart healthy     11/11/16 1630   11/11/16 0000  Call MD for:  temperature >100.4     11/11/16 1630   11/11/16 0000  Call MD for:  severe uncontrolled pain     11/11/16 1630   11/11/16 0000  Call MD for:  redness, tenderness, or signs of infection (pain, swelling, redness, odor or green/yellow discharge around incision site)     11/11/16 1630   11/11/16 0000  Call MD for:  persistant nausea and vomiting     11/11/16 1630   11/11/16 0000  Call MD for:  persistant dizziness or light-headedness     11/11/16 1630   11/11/16 0000  Call MD for:  difficulty breathing, headache or visual disturbances     11/11/16 1630   11/11/16 0000  (HEART FAILURE PATIENTS) Call MD:  Anytime you have any of the following symptoms: 1) 3 pound weight gain in  24 hours or 5 pounds in 1 week 2) shortness of breath, with or without a dry hacking cough 3) swelling in the hands, feet or stomach 4) if you have to sleep on extra pillows at night in order to breathe.     11/11/16 1630      Discharge Assessment: Vitals:   11/11/16 0515 11/11/16 1413  BP: (!) 177/98 (!) 157/80  Pulse: 80 78  Resp: 16 18  Temp: 98.1 F (36.7 C) 98.1 F (36.7 C)  SpO2: 96% 99%   Skin clean, dry and intact without evidence of skin break down, no evidence of skin tears noted. IV catheter discontinued intact. Site without signs and symptoms of complications - no redness or edema noted at insertion site, patient denies c/o pain - only slight tenderness at site.  Dressing with slight pressure applied.  D/c Instructions-Education: Discharge instructions given to patient/family with verbalized understanding. D/c education completed with patient/family including follow up instructions, medication  list, d/c activities limitations if indicated, with other d/c instructions as indicated by MD - patient able to verbalize understanding, all questions fully answered. Patient instructed to return to ED, call 911, or call MD for any changes in condition.  Patient escorted via [place], and D/C home via private auto.  Salley Slaughter, RN 11/11/2016 5:29 PM

## 2016-11-11 NOTE — Progress Notes (Signed)
Patient ID: [name] [name], male   DOB: 1970/11/05, 46 y.o.   MRN: [id number] [facility] Progress Note   Assessment/ Plan:   1. Acute kidney injury and chronic kidney disease stage IV: most likely hemodynamically mediated with recent volume overload/diuretic therapy. He is now closer to euvolemic status and I will convert him to oral furosemide (which should help with both volume/blood pressure management). At this time, recommend deferring renal biopsy to a point where his blood pressure is better controlled rather than this acute phase. Avoid RAS blocking agents at this time (although he has significant proteinuria). Possible discharge later today. 2. Nephrotic syndrome: Multiple possible etiologies given prior history of HIV infection, hepatitis B, syphilis and poorly controlled diabetes. 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 Reschedule renal biopsy as an outpatient as blood pressure improves.  3. Orthopnea/edema: Secondary to nephrotic syndrome/volume overload/salt retention-improving on current low-dose furosemide. 4. Hypertension: suboptimal control on amlodipine 10 mg daily and labetalol 300 mg twice a day- increase labetalol to 400 mg twice a day. Started on doxazosin 2 mg daily at bedtime yesterday and will start furosemide 40 mg daily.  Subjective:   Reports to be feeling much better, states that shortness of breath has resolved and leg swelling is "back to baseline".   Objective:   BP (!) 177/98 (BP Location: Left Arm)   Pulse 80   Temp 98.1 F (36.7 C) (Oral)   Resp 16   Ht 6' (1.829 m)   Wt 104.6 kg (230 lb 8 oz)   SpO2 96%   BMI 31.26 kg/m   Intake/Output Summary (Last 24 hours) at 11/11/16 1001 Last data filed at 11/11/16 0516  Gross per 24 hour  Intake                0 ml  Output             2200 ml  Net             -2200 ml   Weight change: 0.227 kg (8 oz)  Physical Exam: Gen: comfortably resting in bed, watching television CVS: pulse regular rhythm, normal rate, S1 and S2 normal Resp: clear to auscultation bilaterally, no rales or rhonchi Abd: soft, obese, nontender, normal bowel sounds Ext: trace left lower extremity edema, no right lower extremity edema  Imaging: Dg Chest 2 View  Result Date: 11/09/2016 CLINICAL DATA:  Shortness of breath, cough EXAM: CHEST  2 VIEW COMPARISON:  11/09/2016 FINDINGS: Mild patchy bilateral lower lobe opacities. Possible right suprahilar opacity. Suspected trace bilateral pleural effusions. This appearance is worrisome for multifocal pneumonia, although mild interstitial edema is considered less likely. The heart is normal in size. IMPRESSION: Multifocal patchy opacities, lower lobe predominant, suspicious for multifocal pneumonia. Mild interstitial edema is considered less likely. Trace bilateral pleural effusions. Electronically Signed   By: Julian Hy M.D.   On: 11/09/2016 11:44    Labs: BMET  Recent Labs Lab 11/09/16 0802 11/09/16 0814 11/10/16 0555 11/11/16 0539  NA 137 144 138 140  K 3.4* 3.6 3.3* 3.5  CL 110 111 108 108  CO2 22  --  23 25  GLUCOSE 81 82 85 85  BUN 27* 30* 27* 26*  CREATININE 3.07* 3.10* 3.44* 3.63*  CALCIUM 8.2*  --  8.1* 8.3*   CBC  Recent Labs Lab 11/09/16 0802 11/09/16 0814 11/10/16  0555  WBC 7.0  --  6.3  HGB 10.0* 10.5* 9.6*  HCT 32.7* 31.0* 31.4*  MCV 79.2  --  78.9  PLT 291  --  279   Medications:    . abacavir-dolutegravir-lamiVUDine  1 tablet Oral Q24H  . amLODipine  10 mg Oral Daily  . doxazosin  2 mg Oral QHS  . enoxaparin (LOVENOX) injection  40 mg Subcutaneous Q24H  . furosemide  40 mg Oral BID  . insulin aspart  0-9 Units Subcutaneous TID WC  . insulin glargine  30 Units Subcutaneous QHS  . labetalol  300 mg Oral BID  . potassium chloride  20 mEq Oral Daily   Elmarie Shiley, MD 11/11/2016,  10:01 AM

## 2016-11-13 ENCOUNTER — Ambulatory Visit: Payer: BC Managed Care – PPO

## 2016-12-16 ENCOUNTER — Encounter: Payer: BC Managed Care – PPO | Admitting: Internal Medicine

## 2017-01-07 ENCOUNTER — Other Ambulatory Visit: Payer: Self-pay | Admitting: Internal Medicine

## 2017-01-11 ENCOUNTER — Other Ambulatory Visit: Payer: Self-pay | Admitting: Internal Medicine

## 2017-01-31 ENCOUNTER — Other Ambulatory Visit: Payer: Self-pay | Admitting: Internal Medicine

## 2017-02-02 ENCOUNTER — Other Ambulatory Visit: Payer: Self-pay | Admitting: Infectious Diseases

## 2017-02-02 DIAGNOSIS — B20 Human immunodeficiency virus [HIV] disease: Secondary | ICD-10-CM

## 2017-02-02 NOTE — Telephone Encounter (Signed)
Needs office visit.

## 2017-02-03 ENCOUNTER — Encounter: Payer: BC Managed Care – PPO | Admitting: Internal Medicine

## 2017-02-13 ENCOUNTER — Other Ambulatory Visit: Payer: Self-pay | Admitting: *Deleted

## 2017-02-13 MED ORDER — DOXAZOSIN MESYLATE 2 MG PO TABS: 2.0000 mg | ORAL_TABLET | Freq: Every day | ORAL | 3 refills | Status: AC

## 2017-02-13 MED ORDER — FUROSEMIDE 20 MG PO TABS: 20.0000 mg | ORAL_TABLET | Freq: Two times a day (BID) | ORAL | 3 refills | Status: DC

## 2017-02-13 MED ORDER — BASAGLAR KWIKPEN 100 UNIT/ML ~~LOC~~ SOPN: 35.0000 [IU] | PEN_INJECTOR | Freq: Every day | SUBCUTANEOUS | 3 refills | Status: DC

## 2017-02-13 NOTE — Telephone Encounter (Signed)
Requestring 90 days supply.

## 2017-02-16 ENCOUNTER — Telehealth: Payer: Self-pay | Admitting: *Deleted

## 2017-02-16 NOTE — Telephone Encounter (Signed)
Patient left a voice mail stating he wanted to walk in and have a nurse look at a rash on his arm. Called patient back and asked who his PCP was and he said that for a rash it was Dr. [name]. Advised patient that he is established with Internal Medicine at [facility] and it would be appropriate for him to request an appt over there to evaluate his rash. I advised patient that I would let Dr. [name] know and gave him the phone # to IM [phone]. Myrtis Hopping CMA

## 2017-02-17 ENCOUNTER — Other Ambulatory Visit (HOSPITAL_COMMUNITY)
Admission: RE | Admit: 2017-02-17 | Discharge: 2017-02-17 | Disposition: A | Discharge status: Home / Self Care | Payer: BC Managed Care – PPO | Source: Ambulatory Visit | Attending: Internal Medicine | Admitting: Internal Medicine

## 2017-02-17 ENCOUNTER — Encounter (INDEPENDENT_AMBULATORY_CARE_PROVIDER_SITE_OTHER): Payer: Self-pay

## 2017-02-17 ENCOUNTER — Ambulatory Visit: Payer: BC Managed Care – PPO | Admitting: Internal Medicine

## 2017-02-17 ENCOUNTER — Other Ambulatory Visit: Payer: Self-pay | Admitting: Internal Medicine

## 2017-02-17 ENCOUNTER — Encounter: Payer: Self-pay | Admitting: Internal Medicine

## 2017-02-17 ENCOUNTER — Other Ambulatory Visit: Payer: Self-pay

## 2017-02-17 VITALS — BP 158/98 | HR 97 | Temp 97.9°F | Ht 72.0 in | Wt 223.9 lb

## 2017-02-17 DIAGNOSIS — Z8619 Personal history of other infectious and parasitic diseases: Secondary | ICD-10-CM | POA: Diagnosis not present

## 2017-02-17 DIAGNOSIS — Z87438 Personal history of other diseases of male genital organs: Secondary | ICD-10-CM

## 2017-02-17 DIAGNOSIS — E119 Type 2 diabetes mellitus without complications: Secondary | ICD-10-CM | POA: Diagnosis not present

## 2017-02-17 DIAGNOSIS — B181 Chronic viral hepatitis B without delta-agent: Secondary | ICD-10-CM | POA: Diagnosis not present

## 2017-02-17 DIAGNOSIS — L814 Other melanin hyperpigmentation: Secondary | ICD-10-CM | POA: Diagnosis not present

## 2017-02-17 DIAGNOSIS — I10 Essential (primary) hypertension: Secondary | ICD-10-CM

## 2017-02-17 DIAGNOSIS — R21 Rash and other nonspecific skin eruption: Secondary | ICD-10-CM | POA: Diagnosis not present

## 2017-02-17 DIAGNOSIS — D235 Other benign neoplasm of skin of trunk: Secondary | ICD-10-CM | POA: Diagnosis not present

## 2017-02-17 DIAGNOSIS — B20 Human immunodeficiency virus [HIV] disease: Secondary | ICD-10-CM | POA: Diagnosis not present

## 2017-02-17 NOTE — Progress Notes (Signed)
   CC: New rash x 1 month  HPI:  [name] [name] is a 46 y.o. M with history of well-controlled HIV, history of treated secondary syphilis, hx of genital herpes, hepatitis B with unknown treatment history, HTN and Dm2 here with new rash which began on his right arm about 6 weeks ago. Initially noted on lateral aspect of right upper arm however now is present on forearm, shoulder, upper back, back of neck and scalp. There are also lesions on his abdomen and RLE. Not painful, no burning and is not particularly itchy. Has history of rash before on his palms and soles however stated it went away after being treated for syphilis. He otherwise feels well and denied any systemic complaints and states he is compliant with his HIV regimen.   Past Medical History:  Diagnosis Date  . Depression   . Diabetes mellitus    type II 2006  . Hepatitis B infection    hx of hepatitis infection  . HIV infection ([place])   . Hyperlipidemia   . Hypertension   . Syphilis    Review of Systems:   General: Denies fevers, chills, weight loss, fatigue HEENT: Denies sore throat, dysphagia Cardiac: Denies CP, SOB Pulmonary: Denies cough, wheezes Abd: Denies diarrhea, constipation, changes in bowels Extremities: Denies weakness or swelling  Physical Exam: General: Alert, in no acute distress. Pleasant and conversant HEENT: No icterus, injection or ptosis. No hoarseness or dysarthria. No cervical lymphadenopathy appreciated. No thrush.  Cardiac: RRR, no MGR appreciated Pulmonary: CTA BL with normal WOB on RA. Able to speak in complete sentences Abd: Soft, non-tender. +bs Extremities: Warm, perfused. No significant pedal edema.  Skin: Scattered hyperpigmented papules on RUE, right shoulder, right upper back, posterior neck/scalp. Lesions also appreciated on abdomen and RLE. Older lesions appear scaley  Vitals:   02/17/17 0920 02/17/17 1232  BP: (!) 201/123 (!) 158/98  Pulse: 97   Temp: 97.9 F (36.6 C)     TempSrc: Oral   SpO2: 100%   Weight: 223 lb 14.4 oz (101.6 kg)   Height: 6' (1.829 m)    Assessment & Plan:   See Encounters Tab for problem based charting.  Patient seen with Dr. Lynnae January

## 2017-02-17 NOTE — Progress Notes (Signed)
Internal Medicine Clinic Attending  I saw and evaluated the patient.  I personally confirmed the key portions of the history and exam documented by Dr. Danford Bad and I reviewed pertinent patient test results.  The assessment, diagnosis, and plan were formulated together and I agree with the documentation in the resident's note. I was present during the entire procedure by Dr Danford Bad.  Of note, Pt had + Hep B S Ag in 2008 and again in 2018. LF's 2018 nl but elevated in past. Liver bx in 2008 + chronic hepatitis. Not on problem list. PMHx does list h/o Hep B infxn. No signs of cirrhosis. > 6 months of Hep B S Ag means chronic hepB but not clear why core is neg. Pt will need viral load, HBe Ag and Ab, and LFT's. Orders are in.  Reviewed further - hand written ID flow sheets 3/99 - Surface Ag negative and surface Ab 152. original report not available in Epic. Will add S Ab +.

## 2017-02-17 NOTE — Patient Instructions (Signed)
It was great seeing you today! i'm glad you came in for [place] to take a look at your rash. I will call you with the results of the skin biopsy, but it could take up to 1-2 weeks for those results to come in.   Until then, keep your wound clean and dry. Call the clinic if there are any issues!

## 2017-02-18 ENCOUNTER — Encounter: Payer: Self-pay | Admitting: Internal Medicine

## 2017-02-18 DIAGNOSIS — L409 Psoriasis, unspecified: Secondary | ICD-10-CM | POA: Insufficient documentation

## 2017-02-18 LAB — T-HELPER CELLS (CD4) COUNT (NOT AT ARMC)
CD4 % Helper T Cell: 16 % — ABNORMAL LOW (ref 33–55)
CD4 T Cell Abs: 240 /uL — ABNORMAL LOW (ref 400–2700)

## 2017-02-18 NOTE — Assessment & Plan Note (Signed)
Patient does have history of +HepBsAg in 2008 and again in 2018. Most recent LFTs from 2018 normal however does have history of elevations. On chart review there is no mention of prior treatment.  He will need HepB viral load, HBeAg and Ab. LFTs should also be obtained as well.  -Orders in for above tests -Patient will need to follow with ID

## 2017-02-18 NOTE — Assessment & Plan Note (Signed)
Patient notes compliance with his ART. Chart review shows he has missed several ID appointments. CD4 count obtained in clinic today 240. Certainly having poorly-controlled HIV broadens the differential for his cutaneous issue.  -patient to follow with [facility]

## 2017-02-18 NOTE — Assessment & Plan Note (Signed)
Assessment: Male with well-controlled HIV here with new rash x6 weeks. Initially started RUE however now present on back, neck, scalp, abdomen and RLE. Not itchy, not painful and does not drain. Exam shows older lesions as hyperpigmented raised papules with central scabbing/scaling. Newer lesions with ?central ulceration. Not tender. He's been compliant with his ART. No similar rash history.    Plan: Differential is broad in this patient. There is no evidence for a soft tissue infection and I dont feel antibiotics are currently indicated. Viral exanthem vs fungal vs dermatosis also in differential. Certainly given his HIV history, Kaposi sarcoma is a possibility as well. Will obtain punch-biopsy of one of the newer lesions on his abdomen and send for further evaluation.  -93mm punch biopsy performed. Patient tolerated well.  PUNCH BIOPSY PROCEDURE After informed written consent was obtained, using Betadine for cleansing and 1% Lidocaine without epinephrine for anesthetic, with sterile technique a 4 mm punch biopsy was used to obtain a biopsy specimen of the lesion. Hemostasis was obtained by pressure and brief use of aluminum chloride. Clean dry dressing was applied and wound care instructions provided. Be alert for any signs of cutaneous infection. The specimen is labeled and sent to pathology for evaluation. The procedure was well tolerated without complications.

## 2017-02-19 NOTE — Addendum Note (Signed)
Addended by: Larey Dresser A on: 02/19/2017 11:38 AM   Modules accepted: Orders

## 2017-03-27 ENCOUNTER — Other Ambulatory Visit: Payer: Self-pay | Admitting: Infectious Diseases

## 2017-03-27 DIAGNOSIS — B20 Human immunodeficiency virus [HIV] disease: Secondary | ICD-10-CM

## 2017-04-02 ENCOUNTER — Ambulatory Visit: Payer: BC Managed Care – PPO

## 2017-04-03 ENCOUNTER — Other Ambulatory Visit: Payer: Self-pay

## 2017-04-03 ENCOUNTER — Emergency Department (HOSPITAL_COMMUNITY): Payer: BC Managed Care – PPO

## 2017-04-03 ENCOUNTER — Encounter (HOSPITAL_COMMUNITY): Payer: Self-pay | Admitting: Emergency Medicine

## 2017-04-03 DIAGNOSIS — N184 Chronic kidney disease, stage 4 (severe): Secondary | ICD-10-CM | POA: Diagnosis present

## 2017-04-03 DIAGNOSIS — Z841 Family history of disorders of kidney and ureter: Secondary | ICD-10-CM

## 2017-04-03 DIAGNOSIS — N179 Acute kidney failure, unspecified: Secondary | ICD-10-CM | POA: Diagnosis present

## 2017-04-03 DIAGNOSIS — D509 Iron deficiency anemia, unspecified: Secondary | ICD-10-CM | POA: Diagnosis present

## 2017-04-03 DIAGNOSIS — I161 Hypertensive emergency: Secondary | ICD-10-CM | POA: Diagnosis present

## 2017-04-03 DIAGNOSIS — E669 Obesity, unspecified: Secondary | ICD-10-CM | POA: Diagnosis present

## 2017-04-03 DIAGNOSIS — E1122 Type 2 diabetes mellitus with diabetic chronic kidney disease: Secondary | ICD-10-CM | POA: Diagnosis present

## 2017-04-03 DIAGNOSIS — Z833 Family history of diabetes mellitus: Secondary | ICD-10-CM

## 2017-04-03 DIAGNOSIS — N2581 Secondary hyperparathyroidism of renal origin: Secondary | ICD-10-CM | POA: Diagnosis present

## 2017-04-03 DIAGNOSIS — I130 Hypertensive heart and chronic kidney disease with heart failure and stage 1 through stage 4 chronic kidney disease, or unspecified chronic kidney disease: Principal | ICD-10-CM | POA: Diagnosis present

## 2017-04-03 DIAGNOSIS — E785 Hyperlipidemia, unspecified: Secondary | ICD-10-CM | POA: Diagnosis present

## 2017-04-03 DIAGNOSIS — Z8249 Family history of ischemic heart disease and other diseases of the circulatory system: Secondary | ICD-10-CM

## 2017-04-03 DIAGNOSIS — I21A1 Myocardial infarction type 2: Secondary | ICD-10-CM | POA: Diagnosis present

## 2017-04-03 DIAGNOSIS — F329 Major depressive disorder, single episode, unspecified: Secondary | ICD-10-CM | POA: Diagnosis present

## 2017-04-03 DIAGNOSIS — I5033 Acute on chronic diastolic (congestive) heart failure: Secondary | ICD-10-CM | POA: Diagnosis present

## 2017-04-03 DIAGNOSIS — D631 Anemia in chronic kidney disease: Secondary | ICD-10-CM | POA: Diagnosis present

## 2017-04-03 DIAGNOSIS — N289 Disorder of kidney and ureter, unspecified: Secondary | ICD-10-CM | POA: Diagnosis not present

## 2017-04-03 DIAGNOSIS — E876 Hypokalemia: Secondary | ICD-10-CM | POA: Diagnosis present

## 2017-04-03 DIAGNOSIS — Z79899 Other long term (current) drug therapy: Secondary | ICD-10-CM

## 2017-04-03 DIAGNOSIS — Z794 Long term (current) use of insulin: Secondary | ICD-10-CM

## 2017-04-03 DIAGNOSIS — I313 Pericardial effusion (noninflammatory): Secondary | ICD-10-CM | POA: Diagnosis present

## 2017-04-03 DIAGNOSIS — B20 Human immunodeficiency virus [HIV] disease: Secondary | ICD-10-CM | POA: Diagnosis present

## 2017-04-03 DIAGNOSIS — Z6830 Body mass index (BMI) 30.0-30.9, adult: Secondary | ICD-10-CM

## 2017-04-03 DIAGNOSIS — B181 Chronic viral hepatitis B without delta-agent: Secondary | ICD-10-CM | POA: Diagnosis present

## 2017-04-03 LAB — CBC WITH DIFFERENTIAL/PLATELET
BASOS ABS: 0 10*3/uL (ref 0.0–0.1)
Basophils Relative: 1 %
EOS ABS: 0.2 10*3/uL (ref 0.0–0.7)
Eosinophils Relative: 2 %
HCT: 29.1 % — ABNORMAL LOW (ref 39.0–52.0)
HEMOGLOBIN: 8.8 g/dL — AB (ref 13.0–17.0)
LYMPHS ABS: 1.7 10*3/uL (ref 0.7–4.0)
Lymphocytes Relative: 20 %
MCH: 24.2 pg — AB (ref 26.0–34.0)
MCHC: 30.2 g/dL (ref 30.0–36.0)
MCV: 80.2 fL (ref 78.0–100.0)
Monocytes Absolute: 0.5 10*3/uL (ref 0.1–1.0)
Monocytes Relative: 6 %
NEUTROS PCT: 71 %
Neutro Abs: 6.1 10*3/uL (ref 1.7–7.7)
Platelets: 339 10*3/uL (ref 150–400)
RBC: 3.63 MIL/uL — AB (ref 4.22–5.81)
RDW: 16.1 % — ABNORMAL HIGH (ref 11.5–15.5)
WBC: 8.5 10*3/uL (ref 4.0–10.5)

## 2017-04-03 LAB — COMPREHENSIVE METABOLIC PANEL
ALT: 22 U/L (ref 17–63)
AST: 29 U/L (ref 15–41)
Albumin: 2.1 g/dL — ABNORMAL LOW (ref 3.5–5.0)
Alkaline Phosphatase: 53 U/L (ref 38–126)
Anion gap: 8 (ref 5–15)
BUN: 37 mg/dL — ABNORMAL HIGH (ref 6–20)
CHLORIDE: 110 mmol/L (ref 101–111)
CO2: 22 mmol/L (ref 22–32)
CREATININE: 5.38 mg/dL — AB (ref 0.61–1.24)
Calcium: 8.1 mg/dL — ABNORMAL LOW (ref 8.9–10.3)
GFR calc non Af Amer: 11 mL/min — ABNORMAL LOW (ref >60)
GFR, EST AFRICAN AMERICAN: 13 mL/min — AB (ref >60)
Glucose, Bld: 54 mg/dL — ABNORMAL LOW (ref 65–99)
POTASSIUM: 3.6 mmol/L (ref 3.5–5.1)
Sodium: 140 mmol/L (ref 135–145)
TOTAL PROTEIN: 7.4 g/dL (ref 6.5–8.1)
Total Bilirubin: 0.7 mg/dL (ref 0.3–1.2)

## 2017-04-03 LAB — URINALYSIS, ROUTINE W REFLEX MICROSCOPIC
Bilirubin Urine: NEGATIVE
GLUCOSE, UA: 50 mg/dL — AB
Ketones, ur: NEGATIVE mg/dL
LEUKOCYTES UA: NEGATIVE
NITRITE: NEGATIVE
PH: 6 (ref 5.0–8.0)
Protein, ur: 100 mg/dL — AB
SPECIFIC GRAVITY, URINE: 1.013 (ref 1.005–1.030)
SQUAMOUS EPITHELIAL / LPF: NONE SEEN

## 2017-04-03 LAB — BRAIN NATRIURETIC PEPTIDE: B NATRIURETIC PEPTIDE 5: 880 pg/mL — AB (ref 0.0–100.0)

## 2017-04-03 NOTE — ED Triage Notes (Addendum)
Pt reports increase in leg swelling over the last four days, states shortness of breath as well. Pt normally takes 40 MG lasix and has been taking 80MG  lasix attempting to manage fluid volume overload. Pt's lower extremities are tight, no weeping noted.  Spoke with Evette Cristal PA-C regarding patient and received verbal order for BNP.

## 2017-04-04 ENCOUNTER — Inpatient Hospital Stay (HOSPITAL_COMMUNITY): Payer: BC Managed Care – PPO

## 2017-04-04 ENCOUNTER — Inpatient Hospital Stay (HOSPITAL_COMMUNITY)
Admission: EM | Admit: 2017-04-04 | Discharge: 2017-04-07 | DRG: 280 | Disposition: A | Discharge status: Home / Self Care | Payer: BC Managed Care – PPO | Attending: Student in an Organized Health Care Education/Training Program | Admitting: Student in an Organized Health Care Education/Training Program

## 2017-04-04 DIAGNOSIS — Z833 Family history of diabetes mellitus: Secondary | ICD-10-CM | POA: Diagnosis not present

## 2017-04-04 DIAGNOSIS — Z794 Long term (current) use of insulin: Secondary | ICD-10-CM

## 2017-04-04 DIAGNOSIS — N289 Disorder of kidney and ureter, unspecified: Secondary | ICD-10-CM | POA: Diagnosis present

## 2017-04-04 DIAGNOSIS — R0989 Other specified symptoms and signs involving the circulatory and respiratory systems: Secondary | ICD-10-CM | POA: Diagnosis not present

## 2017-04-04 DIAGNOSIS — Z841 Family history of disorders of kidney and ureter: Secondary | ICD-10-CM | POA: Diagnosis not present

## 2017-04-04 DIAGNOSIS — N189 Chronic kidney disease, unspecified: Secondary | ICD-10-CM

## 2017-04-04 DIAGNOSIS — I509 Heart failure, unspecified: Secondary | ICD-10-CM

## 2017-04-04 DIAGNOSIS — N2581 Secondary hyperparathyroidism of renal origin: Secondary | ICD-10-CM | POA: Diagnosis present

## 2017-04-04 DIAGNOSIS — R7989 Other specified abnormal findings of blood chemistry: Secondary | ICD-10-CM | POA: Diagnosis not present

## 2017-04-04 DIAGNOSIS — I21A1 Myocardial infarction type 2: Secondary | ICD-10-CM | POA: Diagnosis present

## 2017-04-04 DIAGNOSIS — E1122 Type 2 diabetes mellitus with diabetic chronic kidney disease: Secondary | ICD-10-CM | POA: Diagnosis present

## 2017-04-04 DIAGNOSIS — I313 Pericardial effusion (noninflammatory): Secondary | ICD-10-CM | POA: Diagnosis present

## 2017-04-04 DIAGNOSIS — I5033 Acute on chronic diastolic (congestive) heart failure: Secondary | ICD-10-CM | POA: Diagnosis present

## 2017-04-04 DIAGNOSIS — R809 Proteinuria, unspecified: Secondary | ICD-10-CM

## 2017-04-04 DIAGNOSIS — E669 Obesity, unspecified: Secondary | ICD-10-CM | POA: Diagnosis present

## 2017-04-04 DIAGNOSIS — I130 Hypertensive heart and chronic kidney disease with heart failure and stage 1 through stage 4 chronic kidney disease, or unspecified chronic kidney disease: Secondary | ICD-10-CM | POA: Diagnosis present

## 2017-04-04 DIAGNOSIS — N179 Acute kidney failure, unspecified: Secondary | ICD-10-CM | POA: Diagnosis present

## 2017-04-04 DIAGNOSIS — D631 Anemia in chronic kidney disease: Secondary | ICD-10-CM | POA: Diagnosis present

## 2017-04-04 DIAGNOSIS — B20 Human immunodeficiency virus [HIV] disease: Secondary | ICD-10-CM | POA: Diagnosis present

## 2017-04-04 DIAGNOSIS — Z6830 Body mass index (BMI) 30.0-30.9, adult: Secondary | ICD-10-CM | POA: Diagnosis not present

## 2017-04-04 DIAGNOSIS — N185 Chronic kidney disease, stage 5: Secondary | ICD-10-CM | POA: Diagnosis present

## 2017-04-04 DIAGNOSIS — I10 Essential (primary) hypertension: Secondary | ICD-10-CM

## 2017-04-04 DIAGNOSIS — I161 Hypertensive emergency: Secondary | ICD-10-CM | POA: Diagnosis present

## 2017-04-04 DIAGNOSIS — B181 Chronic viral hepatitis B without delta-agent: Secondary | ICD-10-CM | POA: Diagnosis present

## 2017-04-04 DIAGNOSIS — E785 Hyperlipidemia, unspecified: Secondary | ICD-10-CM | POA: Diagnosis present

## 2017-04-04 DIAGNOSIS — E876 Hypokalemia: Secondary | ICD-10-CM | POA: Diagnosis present

## 2017-04-04 DIAGNOSIS — N184 Chronic kidney disease, stage 4 (severe): Secondary | ICD-10-CM | POA: Diagnosis present

## 2017-04-04 DIAGNOSIS — Z8249 Family history of ischemic heart disease and other diseases of the circulatory system: Secondary | ICD-10-CM | POA: Diagnosis not present

## 2017-04-04 DIAGNOSIS — D509 Iron deficiency anemia, unspecified: Secondary | ICD-10-CM | POA: Diagnosis present

## 2017-04-04 DIAGNOSIS — Z79899 Other long term (current) drug therapy: Secondary | ICD-10-CM | POA: Diagnosis not present

## 2017-04-04 DIAGNOSIS — E0829 Diabetes mellitus due to underlying condition with other diabetic kidney complication: Secondary | ICD-10-CM

## 2017-04-04 DIAGNOSIS — F329 Major depressive disorder, single episode, unspecified: Secondary | ICD-10-CM | POA: Diagnosis present

## 2017-04-04 LAB — BASIC METABOLIC PANEL
ANION GAP: 11 (ref 5–15)
BUN: 37 mg/dL — ABNORMAL HIGH (ref 6–20)
CALCIUM: 8.1 mg/dL — AB (ref 8.9–10.3)
CO2: 20 mmol/L — ABNORMAL LOW (ref 22–32)
Chloride: 108 mmol/L (ref 101–111)
Creatinine, Ser: 5.39 mg/dL — ABNORMAL HIGH (ref 0.61–1.24)
GFR, EST AFRICAN AMERICAN: 13 mL/min — AB (ref >60)
GFR, EST NON AFRICAN AMERICAN: 11 mL/min — AB (ref >60)
GLUCOSE: 92 mg/dL (ref 65–99)
Potassium: 3.6 mmol/L (ref 3.5–5.1)
Sodium: 139 mmol/L (ref 135–145)

## 2017-04-04 LAB — IRON AND TIBC
Iron: 30 ug/dL — ABNORMAL LOW (ref 45–182)
SATURATION RATIOS: 10 % — AB (ref 17.9–39.5)
TIBC: 291 ug/dL (ref 250–450)
UIBC: 261 ug/dL

## 2017-04-04 LAB — ECHOCARDIOGRAM COMPLETE
AVLVOTPG: 8 mmHg
FS: 33 % (ref 28–44)
IVS/LV PW RATIO, ED: 0.81
LA ID, A-P, ES: 55 mm
LA diam index: 2.46 cm/m2
LA vol A4C: 93.7 ml
LA vol index: 54 mL/m2
LA vol: 121 mL
LEFT ATRIUM END SYS DIAM: 55 mm
LVOT SV: 89 mL
LVOT VTI: 25.6 cm
LVOT area: 3.46 cm2
LVOT diameter: 21 mm
LVOT peak vel: 145 cm/s
MV pk E vel: 7.29 m/s
PW: 19.5 mm — AB (ref 0.6–1.1)
RV LATERAL S' VELOCITY: 18.4 cm/s
TAPSE: 38.4 mm

## 2017-04-04 LAB — TROPONIN I
Troponin I: 0.63 ng/mL (ref <0.03)
Troponin I: 1.19 ng/mL (ref <0.03)

## 2017-04-04 LAB — RAPID URINE DRUG SCREEN, HOSP PERFORMED
AMPHETAMINES: NOT DETECTED
BENZODIAZEPINES: NOT DETECTED
Barbiturates: NOT DETECTED
COCAINE: NOT DETECTED
Opiates: NOT DETECTED
Tetrahydrocannabinol: NOT DETECTED

## 2017-04-04 LAB — GLUCOSE, CAPILLARY
GLUCOSE-CAPILLARY: 127 mg/dL — AB (ref 65–99)
Glucose-Capillary: 157 mg/dL — ABNORMAL HIGH (ref 65–99)

## 2017-04-04 LAB — CBG MONITORING, ED
GLUCOSE-CAPILLARY: 128 mg/dL — AB (ref 65–99)
GLUCOSE-CAPILLARY: 88 mg/dL (ref 65–99)
Glucose-Capillary: 60 mg/dL — ABNORMAL LOW (ref 65–99)

## 2017-04-04 LAB — I-STAT TROPONIN, ED: Troponin i, poc: 0.12 ng/mL (ref 0.00–0.08)

## 2017-04-04 LAB — FERRITIN: Ferritin: 66 ng/mL (ref 24–336)

## 2017-04-04 LAB — CK: Total CK: 1469 U/L — ABNORMAL HIGH (ref 49–397)

## 2017-04-04 MED ORDER — NICARDIPINE HCL IN NACL 20-0.86 MG/200ML-% IV SOLN
3.0000 mg/h | INTRAVENOUS | Status: DC
Start: 2017-04-04 — End: 2017-04-04
  Administered 2017-04-04: 7.5 mg/h via INTRAVENOUS
  Filled 2017-04-04: qty 200

## 2017-04-04 MED ORDER — FUROSEMIDE 10 MG/ML IJ SOLN
80.0000 mg | Freq: Once | INTRAMUSCULAR | Status: AC
  Administered 2017-04-04: 80 mg via INTRAVENOUS
  Filled 2017-04-04: qty 8

## 2017-04-04 MED ORDER — FUROSEMIDE 10 MG/ML IJ SOLN
80.0000 mg | Freq: Two times a day (BID) | INTRAMUSCULAR | Status: DC
  Administered 2017-04-05: 80 mg via INTRAVENOUS
  Filled 2017-04-04: qty 8

## 2017-04-04 MED ORDER — ABACAVIR-DOLUTEGRAVIR-LAMIVUD 600-50-300 MG PO TABS
1.0000 | ORAL_TABLET | Freq: Every day | ORAL | Status: DC
  Administered 2017-04-04: 1 via ORAL
  Filled 2017-04-04 (×2): qty 1

## 2017-04-04 MED ORDER — VERAPAMIL HCL ER 240 MG PO TBCR: 240.0000 mg | EXTENDED_RELEASE_TABLET | Freq: Every day | ORAL | Status: DC

## 2017-04-04 MED ORDER — HYDRALAZINE HCL 20 MG/ML IJ SOLN
10.0000 mg | INTRAMUSCULAR | Status: DC | PRN
  Administered 2017-04-04 – 2017-04-05 (×3): 10 mg via INTRAVENOUS
  Filled 2017-04-04 (×2): qty 1

## 2017-04-04 MED ORDER — HYDRALAZINE HCL 20 MG/ML IJ SOLN
10.0000 mg | Freq: Once | INTRAMUSCULAR | Status: AC
  Administered 2017-04-04: 10 mg via INTRAVENOUS
  Filled 2017-04-04: qty 1

## 2017-04-04 MED ORDER — NICARDIPINE HCL IN NACL 20-0.86 MG/200ML-% IV SOLN
3.0000 mg/h | Freq: Once | INTRAVENOUS | Status: AC
  Administered 2017-04-04: 5 mg/h via INTRAVENOUS
  Filled 2017-04-04: qty 200

## 2017-04-04 MED ORDER — INSULIN GLARGINE 100 UNIT/ML ~~LOC~~ SOLN
20.0000 [IU] | Freq: Every day | SUBCUTANEOUS | Status: DC
  Administered 2017-04-04 – 2017-04-06 (×3): 20 [IU] via SUBCUTANEOUS
  Filled 2017-04-04 (×4): qty 0.2

## 2017-04-04 MED ORDER — INSULIN ASPART 100 UNIT/ML ~~LOC~~ SOLN
0.0000 [IU] | Freq: Three times a day (TID) | SUBCUTANEOUS | Status: DC
  Administered 2017-04-04: 1 [IU] via SUBCUTANEOUS
  Administered 2017-04-05: 2 [IU] via SUBCUTANEOUS
  Administered 2017-04-06 (×2): 1 [IU] via SUBCUTANEOUS

## 2017-04-04 MED ORDER — DOXAZOSIN MESYLATE 2 MG PO TABS
2.0000 mg | ORAL_TABLET | Freq: Every day | ORAL | Status: DC
  Administered 2017-04-04 – 2017-04-06 (×3): 2 mg via ORAL
  Filled 2017-04-04 (×4): qty 1

## 2017-04-04 MED ORDER — HYDRALAZINE HCL 50 MG PO TABS
50.0000 mg | ORAL_TABLET | Freq: Three times a day (TID) | ORAL | Status: DC
  Administered 2017-04-04: 50 mg via ORAL
  Filled 2017-04-04: qty 1

## 2017-04-04 MED ORDER — ENOXAPARIN SODIUM 30 MG/0.3ML ~~LOC~~ SOLN
30.0000 mg | Freq: Every day | SUBCUTANEOUS | Status: DC
  Administered 2017-04-05 – 2017-04-07 (×3): 30 mg via SUBCUTANEOUS
  Filled 2017-04-04 (×4): qty 0.3

## 2017-04-04 MED ORDER — VERAPAMIL HCL ER 240 MG PO TBCR
240.0000 mg | EXTENDED_RELEASE_TABLET | Freq: Every day | ORAL | Status: DC
  Administered 2017-04-04 – 2017-04-05 (×2): 240 mg via ORAL
  Filled 2017-04-04 (×2): qty 1

## 2017-04-04 MED ORDER — HYDRALAZINE HCL 25 MG PO TABS
25.0000 mg | ORAL_TABLET | Freq: Three times a day (TID) | ORAL | Status: DC
  Administered 2017-04-04: 25 mg via ORAL
  Filled 2017-04-04: qty 1

## 2017-04-04 NOTE — Progress Notes (Signed)
  Echocardiogram 2D Echocardiogram has been performed.  Merrie Roof F 04/04/2017, 11:05 AM

## 2017-04-04 NOTE — ED Notes (Signed)
Attempted to call report

## 2017-04-04 NOTE — Progress Notes (Signed)
Subjective: [name] [name] is doing well this morning. He states that his lower extremity swelling has already begun to improve along with his breathing. He continues to have orthopnea. He reiterates that this all started about 4 days ago. He states that he is compliant with all his medications and does not miss any doses. He states that 4 days ago when he began to notice his lower extremity swelling and shortness of breath he doubled his Lasix from 20 mg twice daily to 40 mg twice daily. This did not appear to help significantly. He does not follow a salt or fluid restriction. He denies the use of any illicit substances. He denies any vision changes, headaches, chest pain, abdominal pain. We discussed that the plan is to bring his blood pressure down gradually and try to improve his lower extremity swelling, and breathing. All questions and concerns addressed.  Objective: Vital signs in last 24 hours: Vitals:   04/04/17 0500 04/04/17 0515 04/04/17 0530 04/04/17 0545  BP: (!) 181/100 (!) 165/90 (!) 164/89 (!) 166/88  Pulse: (!) 104 (!) 107 (!) 107 (!) 103  Resp: 12 15 13 17   Temp:      TempSrc:      SpO2: 100% 100% 100% 99%  Weight:      Height:       General: Obese male in no acute distress Pulm: Rales/rhonchi in the lower lung fields bilaterally CV: Tachycardic, but regular rhythm, JVD present Abdomen: Active bowel sounds, soft, nondistended, no tenderness to palpation Extremities: Moderate to severe pitting edema bilaterally  Assessment/Plan:  [name] [name] is a 47 year old male with chronic kidney disease stage IV and heart failure preserved ejection fraction who presented to the ED with signs and symptoms of hypertensive emergency. He was started on a nicardipine drip and his blood pressure has come down nicely.  Hypertensive emergency.  Patient presented with systolic blood pressure above 200 along with endorgan damage including an elevated creatinine 5.38 from a baseline of 3.5  and an elevated troponin of 0.12.  It is difficult to say whether his hypertensive emergency is causing his acute heart failure exacerbation and AKI, or other variety of the combination. - Restarted patient's home verapamil - Diuretic therapy as outlined below - Urine drug screen - Goal blood pressure is 150-160/80-90 in the next 24 hours.  HFpEF, acute exacerbation - Patient endorsing signs and symptoms of acute heart failure exacerbation including lower extremity edema, shortness of breath, and orthopnea.   - Echocardiogram in August 2018 showing EF of 55-60% with grade 2 diastolic dysfunction, mild mitral regurgitation, LAE, mild regurgitation of the tricuspid and pulmonic valves, increased pulmonary pressure, normal right-sided systolic and diastolic function. - BNP elevated at 880. - Strict I & O's  - We will continue to diurese with IV Furosemide 80 mg BID and monitor renal function. - Repeat Echocardiogram  - Repeat BMP this afternoon   Acute on CKD Stage IV - Baseline GFR in the mid 20s. GFR on admission 13.  - UA showing hemoglobinuria and proteinuria.  Hemoglobinuria is not accompanied by increase in RBCs, CK elevated to approximately 1400 which would explain hemoglobinuria on UA. -The patient's AKI is likely secondary to either hypertensive emergency, or acute heart failure exacerbation. - Management with blood pressure control and diuresis. - No acute indications for HD at this point  Troponinemia  - Patient presented with an elevated troponin of 0.12  - EKG unchanged compared to prior - Likely secondary to hypertensive emergency with CKD.  We will continue to trend troponins  Normocytic Anemia  - Hgb 8.8 with elevated RDW - Checking iron studies   HIV - Recent CD4 240, absolute 16 - Continue Trimeq  Type 2 diabetes mellitus -  Lantus 20 units QHS + SSI  Dispo: Anticipated discharge in approximately 2-3 day(s).   Ina Homes, MD 04/04/2017, 5:53 AM My Pager:  [phone]

## 2017-04-04 NOTE — ED Notes (Signed)
ED Provider at bedside. 

## 2017-04-04 NOTE — ED Provider Notes (Signed)
[facility] Provider Note  CSN: [id number] Arrival date & time: 04/03/17 1821  Chief Complaint(s) Shortness of Breath and Leg Swelling  HPI [name] [name] is a 47 y.o. male with a history of hypertension, diabetes, diastolic heart failure on Lasix, CKD stage IV who presents to the emergency department with several days of worsening bilateral lower extremity edema and dyspnea on exertion with orthopnea.  Patient reports that his bilateral lower extremity edema went from ankle swelling to swelling above the knees.  At rest the patient does not feel short of breath however his dyspnea on exertion has been worsening for the past several days even with minimal activity.  He also endorses a dry cough.  Denies any sputum production.  Denies any recent fevers or infections.  No chest pain, nausea, vomiting, abdominal pain.  No dysuria.  Reports that he still makes urine.  States that since this the swelling has been getting worse, he has doubled up on his Lasix, taking it 4 times a day instead of twice a day.  He has had no improvement since doing this.  Additionally patient has a history of HIV on antiretroviral medication; reported to be compliant.   States that he is compliant with his antihypertensive medication.  HPI  Past Medical History Past Medical History:  Diagnosis Date  . Depression   . Diabetes mellitus    type II 2006  . Hepatitis B infection    hx of hepatitis infection  . HIV infection ([place])   . Hyperlipidemia   . Hypertension   . Syphilis    Patient Active Problem List   Diagnosis Date Noted  . Rash and nonspecific skin eruption 02/18/2017  . 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 Heart failure with preserved left ventricular function (HFpEF) ([place]) 11/09/2016  . Other microscopic hematuria 09/01/2016  . S/P left knee surgery 09/03/2015  . Chronic kidney disease (CKD), stage IV  (severe) ([place]) 03/07/2015  . Adult acne 02/17/2015  . 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) 06/22/2014  . Syphilis 06/08/2013  . Health care maintenance 10/01/2011  . Human immunodeficiency virus (HIV) disease ([place]) 02/24/2006  . Hyperlipidemia 02/24/2006  . Essential hypertension 02/24/2006   Home Medication(s) Prior to Admission medications   Medication Sig Start Date End Date Taking? Authorizing Provider  doxazosin (CARDURA) 2 MG tablet Take 1 tablet (2 mg total) by mouth at bedtime. 02/13/17  Yes Tawny Asal, MD  furosemide (LASIX) 20 MG tablet Take 1 tablet (20 mg total) by mouth 2 (two) times daily. Patient taking differently: Take 20-40 mg by mouth 2 (two) times daily.  02/13/17  Yes Tawny Asal, MD  Insulin Glargine (BASAGLAR KWIKPEN) 100 UNIT/ML SOPN Inject 0.35 mLs (35 Units total) into the skin daily. Patient taking differently: Inject 40 Units into the skin daily before breakfast.  02/13/17  Yes Tawny Asal, MD  KLOR-CON M20 20 MEQ tablet TAKE 1 TABLET BY MOUTH EVERY DAY Patient taking differently: TAKE 1 TABLET (20 MEQ)  BY MOUTH EVERY DAY 02/02/17  Yes Tawny Asal, MD  ondansetron (ZOFRAN-ODT) 4 MG disintegrating tablet Take 4 mg by mouth every 8 (eight) hours as needed for nausea or vomiting.   Yes [provider]  TRIUMEQ 600-50-300 MG tablet TAKE ONE TABLET BY MOUTH ONCE DAILY. STORE IN ORIGINAL BOTTLE AT [facility]. Patient taking differently: TAKE ONE TABLET BY MOUTH ONCE DAILY AT BEDTIME. STORE  IN ORIGINAL BOTTLE AT [facility]. 03/27/17  Yes Campbell Riches, MD  verapamil (CALAN-SR) 240 MG CR tablet TAKE 1 TABLET (240 MG TOTAL) BY MOUTH DAILY. 01/12/17  Yes Tawny Asal, MD  glucose blood test strip 1 each by Other route 2 (two) times daily. One touch verio iq.  And lancets 2/day 09/03/15   Rivet, Sindy Guadeloupe, MD  Insulin Pen Needle (B-D UF III MINI PEN NEEDLES) 31G X 5 MM  MISC Use to inject Lantus once daily 08/19/16   Bartholomew Crews, MD  KLOR-CON M20 20 MEQ tablet TAKE 1 TABLET BY MOUTH EVERY DAY Patient not taking: Reported on 04/03/2017 01/07/17   Tawny Asal, MD  [facility] DELICA LANCETS MISC Use to check blood sugar up to 3 times daily Dx code: 250.00 09/06/10   Rosalia Hammers, MD                                                                                                                                    Past Surgical History History reviewed. No pertinent surgical history. Family History Family History  Problem Relation Age of Onset  . Coronary artery disease Maternal Grandfather   . Kidney disease Mother   . Diabetes Mother   . Heart disease Mother   . Kidney disease Father   . Diabetes Maternal Aunt   . Diabetes Maternal Uncle   . Diabetes Maternal Grandmother     Social History Social History   Tobacco Use  . Smoking status: Never Smoker  . Smokeless tobacco: Never Used  Substance Use Topics  . Alcohol use: No    Alcohol/week: 0.0 oz  . Drug use: No   Allergies Patient has no known allergies.  Review of Systems Review of Systems All other systems are reviewed and are negative for acute change except as noted in the HPI  Physical Exam Vital Signs  I have reviewed the triage vital signs BP (!) 216/129   Pulse 100   Temp 98.2 F (36.8 C) (Oral)   Resp 14   Ht 6' (1.829 m)   Wt 102.1 kg (225 lb)   SpO2 100%   BMI 30.52 kg/m   Physical Exam  Constitutional: He is oriented to person, place, and time. He appears well-developed and well-nourished. No distress.  HENT:  Head: Normocephalic and atraumatic.  Nose: Nose normal.  Eyes: Conjunctivae and EOM are normal. Pupils are equal, round, and reactive to light. Right eye exhibits no discharge. Left eye exhibits no discharge. No scleral icterus.  Neck: Normal range of motion. Neck supple. JVD present.  Cardiovascular: Normal rate and regular rhythm. Exam reveals no  gallop and no friction rub.  No murmur heard. Pulmonary/Chest: Effort normal. No stridor. No respiratory distress. He has rales (fine) in the right lower field and the left lower field.  Abdominal: Soft. He exhibits no distension. There is no tenderness.  Musculoskeletal: He exhibits no edema or tenderness.  2-3+ bilateral lower extremity edema extending above the knees.  Neurological: He is alert and oriented to person, place, and time.  Skin: Skin is warm and dry. No rash noted. He is not diaphoretic. No erythema.  Psychiatric: He has a normal mood and affect.  Vitals reviewed.   ED Results and Treatments Labs (all labs ordered are listed, but only abnormal results are displayed) Labs Reviewed  COMPREHENSIVE METABOLIC PANEL - Abnormal; Notable for the following components:      Result Value   Glucose, Bld 54 (*)    BUN 37 (*)    Creatinine, Ser 5.38 (*)    Calcium 8.1 (*)    Albumin 2.1 (*)    GFR calc non Af Amer 11 (*)    GFR calc Af Amer 13 (*)    All other components within normal limits  CBC WITH DIFFERENTIAL/PLATELET - Abnormal; Notable for the following components:   RBC 3.63 (*)    Hemoglobin 8.8 (*)    HCT 29.1 (*)    MCH 24.2 (*)    RDW 16.1 (*)    All other components within normal limits  BRAIN NATRIURETIC PEPTIDE - Abnormal; Notable for the following components:   B Natriuretic Peptide 880.0 (*)    All other components within normal limits  URINALYSIS, ROUTINE W REFLEX MICROSCOPIC - Abnormal; Notable for the following components:   Glucose, UA 50 (*)    Hgb urine dipstick MODERATE (*)    Protein, ur 100 (*)    Bacteria, UA RARE (*)    All other components within normal limits  I-STAT TROPONIN, ED                                                                                                                         EKG  EKG Interpretation  Date/Time:  Friday April 03 2017 19:31:59 EST Ventricular Rate:  100 PR Interval:  194 QRS Duration: 88 QT  Interval:  356 QTC Calculation: 459 R Axis:   69 Text Interpretation:  Normal sinus rhythm Anterior infarct , age undetermined Abnormal ECG No significant change since last tracing Confirmed by Addison Lank [phone]) on 04/04/2017 4:08:16 AM      Radiology Dg Chest 2 View  Result Date: 04/03/2017 CLINICAL DATA:  Ankle swelling, water pills not helping. History of HIV, hypertension, hyperlipidemia and diabetes. EXAM: CHEST  2 VIEW COMPARISON:  Chest radiograph November 09, 2016 FINDINGS: Cardiac silhouette is upper limits of normal in size. Small bilateral pleural effusions. Pulmonary vascular congestion. Coarsened pulmonary interstitium without focal consolidation. No pneumothorax. Soft tissue planes and included osseous structures are non suspicious. IMPRESSION: Borderline cardiomegaly and pulmonary vascular congestion. Small pleural effusions. Coarsened pulmonary interstitium seen with pulmonary edema or, atypical infection without focal consolidation. Electronically Signed   By: Elon Alas M.D.   On: 04/03/2017 20:15   Pertinent labs & imaging results that were available during my care of the patient were reviewed by me and considered in my medical  decision making (see chart for details).  Medications Ordered in ED Medications  nicardipine (CARDENE) 20mg  in 0.86% saline 263ml IV infusion (0.1 mg/ml) (5 mg/hr Intravenous New Bag/Given 04/04/17 0417)  hydrALAZINE (APRESOLINE) injection 10 mg (10 mg Intravenous Given 04/04/17 0417)                                                                                                                                    Procedures Procedures CRITICAL CARE Performed by: Grayce Sessions Jyden Kromer Total critical care time: 30 minutes Critical care time was exclusive of separately billable procedures and treating other patients. Critical care was necessary to treat or prevent imminent or life-threatening deterioration. Critical care was time spent  personally by me on the following activities: development of treatment plan with patient and/or surrogate as well as nursing, discussions with consultants, evaluation of patient's response to treatment, examination of patient, obtaining history from patient or surrogate, ordering and performing treatments and interventions, ordering and review of laboratory studies, ordering and review of radiographic studies, pulse oximetry and re-evaluation of patient's condition.   (including critical care time)  Medical Decision Making / ED Course I have reviewed the nursing notes for this encounter and the patient's prior records (if available in EHR or on provided paperwork).    Patient is hypertensive to 210/130s.  Workup is consistent with CHF exacerbation with worsening renal function.   His presentation is concerning for hypertensive emergency.  EKG without acute ischemic changes.  Patient is currently denying any chest pain.  Pending troponin.  Patient was started immediately on IV Cardene and given a dose of hydralazine.   We will discussed the case with internal medicine for admission and further management of hypertensive emergency.  Final Clinical Impression(s) / ED Diagnoses Final diagnoses:  Hypertensive emergency  Acute on chronic diastolic congestive heart failure ([place])  Acute on chronic renal insufficiency      This chart was dictated using voice recognition software.  Despite best efforts to proofread,  errors can occur which can change the documentation meaning.   Fatima Blank, MD 04/04/17 [phone]

## 2017-04-04 NOTE — Progress Notes (Signed)
   04/04/17 2300  Vitals  BP (!) 197/101   10 hydralazine given will continue to monitor

## 2017-04-04 NOTE — ED Notes (Signed)
Admitting MD Dareen Piano at bedside.

## 2017-04-04 NOTE — ED Notes (Signed)
Lunch tray ordered; renal, heart healthy diet

## 2017-04-04 NOTE — H&P (Signed)
Date: 04/04/2017               Patient Name:  [name] [name] MRN: [id number]  DOB: 06/09/1970 Age / Sex: 47 y.o., male   PCP: Tawny Asal, MD         Medical Service: Internal Medicine Teaching Service         Attending Physician: Dr. Aldine Contes, MD    First Contact: Dr. Tarri Abernethy Pager: [phone]  Second Contact: Dr. Danford Bad Pager: [phone]       After Hours (After 5p/  First Contact Pager: [phone]  weekends / holidays): Second Contact Pager: [phone]   Chief Complaint: shortness of breath on exertion  History of Present Illness:  47 yo male with PMHx significant for HTN, Type 2 diabetes, CKD, chronic diastolic heart failure presenting with progressive shortness of breath on exertion, orthopnea, and lower extremity swelling. The patient states he noted ankle swelling four days ago. The swelling continued to progress to above his knees and he began having SOB on exertion, which he does not have at baseline. He started to take additional doses of lasix, which did not relieve his DOE or his lower extremity swelling. He has no dyspnea at rest. He has been compliant with all medications, and denies missed doses of lasix or BP meds. Denies increased fluid intake from his usual. He also endorses dry cough for the past several days. He denies chest pain, fevers, chills, nausea, vomiting or recent infection. Denies changes in urinary frequency. He also complains of a mild headache, but denies visual changes.   ED Course: Upon arrival to the ED the patient was hypertensive with BP 214/131, tachycardic with HR of 103, saturating 100% on RA. He was started on a nicardipine gtt.  Pertinent labs: Cr 5.38 (baseline ~3.5), BUN 37, Albumin 2.1; BNP 880.0; Hgb 8.8; I-stat troponin 0.12; UA w/ microscopic hematuria, protein 100, rare bacteria  Meds:  Current Meds  Medication Sig  . doxazosin (CARDURA) 2 MG tablet Take 1 tablet (2 mg total) by mouth at bedtime.  . furosemide (LASIX) 20 MG  tablet Take 1 tablet (20 mg total) by mouth 2 (two) times daily. (Patient taking differently: Take 20-40 mg by mouth 2 (two) times daily. )  . Insulin Glargine (BASAGLAR KWIKPEN) 100 UNIT/ML SOPN Inject 0.35 mLs (35 Units total) into the skin daily. (Patient taking differently: Inject 40 Units into the skin daily before breakfast. )  . KLOR-CON M20 20 MEQ tablet TAKE 1 TABLET BY MOUTH EVERY DAY (Patient taking differently: TAKE 1 TABLET (20 MEQ)  BY MOUTH EVERY DAY)  . ondansetron (ZOFRAN-ODT) 4 MG disintegrating tablet Take 4 mg by mouth every 8 (eight) hours as needed for nausea or vomiting.  . TRIUMEQ 600-50-300 MG tablet TAKE ONE TABLET BY MOUTH ONCE DAILY. STORE IN ORIGINAL BOTTLE AT [facility]. (Patient taking differently: TAKE ONE TABLET BY MOUTH ONCE DAILY AT BEDTIME. STORE IN ORIGINAL BOTTLE AT [facility].)  . verapamil (CALAN-SR) 240 MG CR tablet TAKE 1 TABLET (240 MG TOTAL) BY MOUTH DAILY.     Allergies: Allergies as of 04/03/2017  . (No Known Allergies)   Past Medical History:  Diagnosis Date  . Depression   . Diabetes mellitus    type II 2006  . Hepatitis B infection    hx of hepatitis infection  . HIV infection ([place])   . Hyperlipidemia   . Hypertension   . Syphilis     Family History:  Family History  Problem Relation  Age of Onset  . Coronary artery disease Maternal Grandfather   . Kidney disease Mother   . Diabetes Mother   . Heart disease Mother   . Kidney disease Father   . Diabetes Maternal Aunt   . Diabetes Maternal Uncle   . Diabetes Maternal Grandmother     Social History:  Social History   Tobacco Use  . Smoking status: Never Smoker  . Smokeless tobacco: Never Used  Substance Use Topics  . Alcohol use: No    Alcohol/week: 0.0 oz  . Drug use: No   Review of Systems: A complete ROS was negative except as per HPI.   Physical Exam: Blood pressure (!) 164/89, pulse (!) 107, temperature 98.2 F (36.8 C), temperature source Oral, resp.  rate 13, height 6' (1.829 m), weight 225 lb (102.1 kg), SpO2 100 %. Physical Exam  Constitutional: He is oriented to person, place, and time. He appears well-developed and well-nourished. No distress.  HENT:  Head: Normocephalic and atraumatic.  Mouth/Throat: Oropharynx is clear and moist.  Eyes: Conjunctivae are normal. No scleral icterus.  Neck: Normal range of motion. Neck supple. JVD present.  Cardiovascular: Regular rhythm and intact distal pulses. Tachycardia present.  Pulmonary/Chest: No accessory muscle usage. No respiratory distress. He has rales in the right lower field and the left lower field.  Musculoskeletal: He exhibits edema (3+ pitting edema bilateral lower extremities up to knees).  Neurological: He is alert and oriented to person, place, and time. No cranial nerve deficit.  Skin: Skin is warm and dry.  Psychiatric: He has a normal mood and affect.    EKG: personally reviewed my interpretation is sinus tachycardia. No ST elevations or evidence of acute ischemia.  CXR: personally reviewed my interpretation is bilateral small pleural effusions and increased vascular markings   Assessment & Plan by Problem: Active Problems:   Hypertensive emergency  Hypertensive Emergency BP > than 716 systolic, and >967 diastolic on admission, elevated creatinine 5.38 from baseline of 3.5. Home BP regimen Verapamil 240 mg daily and Doxazosin 2 mg hs  -Nicardipine gtt started in ED  -Continue Verapamil and Doxazosin   Acute on Chronic Diastolic Heart Failure Exacerbation  Patient with progressively worsening DOE, orthopnea, and lower extremity swelling not relieved by increase in home lasix dose (20 mg BID). BNP 880, bibasilar crackles and bilateral 3+ pitting edema of lower extremity, CXR w/ evidence of pulmonary edema. Last ECHO 10/2016 with EF 55-60%, G2DD.  -IV lasix 80 mg x 1 - will need to assess response  -Daily weights -Strict I/Os -Fluid restriction 1200 mL -Continuous pulse  ox   AKI on Chronic Kidney Disease  Creatine elevate 5.38 from baseline of 3.5 likely due to acute heart failure exacerbation and decreased filtration due to vascular congestion. Patient had previously been scheduled to get a kidney biopsy due to nephrotic range proteinuria, but was unable to due so because of consistently elevated blood pressure.  -BMET @ 1100 AM  -if no improvement in Creatinine after diuresis may need nephrology consult  Normocytic Anemia Chronic and progressively worsening; Hgb 8.8 on admission. Previously worked up in 10/2016 with Ferritin, iron, and TIBC all within normal limits. Likely due to CKD.   Type 2 Diabetes Most recent A1C was 6.8 (08/2016). Home regimen includes 40 units of [place] qhs. -CBG monitoring -Lantus 20 qhs -SSI sensitive   HIV Last CD4 count 240 in 02/2017.  -Continue Triumeq   Dispo: Admit patient to Inpatient with expected length of stay greater than  2 midnights.  Signed: Melanee Spry, MD 04/04/2017, 5:39 AM  Pager: [phone]

## 2017-04-04 NOTE — ED Notes (Signed)
Admitting provider at bedside.

## 2017-04-04 NOTE — ED Notes (Signed)
Checked patient cbg it was 128 notified RN of blood sugar

## 2017-04-05 ENCOUNTER — Inpatient Hospital Stay (HOSPITAL_COMMUNITY): Payer: BC Managed Care – PPO

## 2017-04-05 DIAGNOSIS — Z79899 Other long term (current) drug therapy: Secondary | ICD-10-CM

## 2017-04-05 DIAGNOSIS — E1122 Type 2 diabetes mellitus with diabetic chronic kidney disease: Secondary | ICD-10-CM

## 2017-04-05 DIAGNOSIS — D509 Iron deficiency anemia, unspecified: Secondary | ICD-10-CM

## 2017-04-05 DIAGNOSIS — I130 Hypertensive heart and chronic kidney disease with heart failure and stage 1 through stage 4 chronic kidney disease, or unspecified chronic kidney disease: Principal | ICD-10-CM

## 2017-04-05 DIAGNOSIS — I161 Hypertensive emergency: Secondary | ICD-10-CM

## 2017-04-05 DIAGNOSIS — N179 Acute kidney failure, unspecified: Secondary | ICD-10-CM

## 2017-04-05 DIAGNOSIS — I5033 Acute on chronic diastolic (congestive) heart failure: Secondary | ICD-10-CM

## 2017-04-05 DIAGNOSIS — R0989 Other specified symptoms and signs involving the circulatory and respiratory systems: Secondary | ICD-10-CM

## 2017-04-05 DIAGNOSIS — I21A1 Myocardial infarction type 2: Secondary | ICD-10-CM

## 2017-04-05 DIAGNOSIS — N184 Chronic kidney disease, stage 4 (severe): Secondary | ICD-10-CM

## 2017-04-05 DIAGNOSIS — E669 Obesity, unspecified: Secondary | ICD-10-CM

## 2017-04-05 DIAGNOSIS — Z794 Long term (current) use of insulin: Secondary | ICD-10-CM

## 2017-04-05 DIAGNOSIS — B20 Human immunodeficiency virus [HIV] disease: Secondary | ICD-10-CM

## 2017-04-05 LAB — TROPONIN I
TROPONIN I: 0.96 ng/mL — AB (ref <0.03)
Troponin I: 1 ng/mL (ref <0.03)

## 2017-04-05 LAB — GLUCOSE, CAPILLARY
GLUCOSE-CAPILLARY: 107 mg/dL — AB (ref 65–99)
Glucose-Capillary: 115 mg/dL — ABNORMAL HIGH (ref 65–99)
Glucose-Capillary: 163 mg/dL — ABNORMAL HIGH (ref 65–99)
Glucose-Capillary: 80 mg/dL (ref 65–99)

## 2017-04-05 LAB — BASIC METABOLIC PANEL
Anion gap: 9 (ref 5–15)
BUN: 39 mg/dL — AB (ref 6–20)
CHLORIDE: 109 mmol/L (ref 101–111)
CO2: 22 mmol/L (ref 22–32)
Calcium: 8 mg/dL — ABNORMAL LOW (ref 8.9–10.3)
Creatinine, Ser: 5.61 mg/dL — ABNORMAL HIGH (ref 0.61–1.24)
GFR calc Af Amer: 13 mL/min — ABNORMAL LOW (ref >60)
GFR, EST NON AFRICAN AMERICAN: 11 mL/min — AB (ref >60)
GLUCOSE: 120 mg/dL — AB (ref 65–99)
Potassium: 3.7 mmol/L (ref 3.5–5.1)
Sodium: 140 mmol/L (ref 135–145)

## 2017-04-05 LAB — PROTEIN / CREATININE RATIO, URINE
Creatinine, Urine: 52.01 mg/dL
Protein Creatinine Ratio: 3.85 mg/mg{Cre} — ABNORMAL HIGH (ref 0.00–0.15)
Total Protein, Urine: 200 mg/dL

## 2017-04-05 MED ORDER — DOLUTEGRAVIR SODIUM 50 MG PO TABS
50.0000 mg | ORAL_TABLET | Freq: Every day | ORAL | Status: DC
  Administered 2017-04-05 – 2017-04-06 (×2): 50 mg via ORAL
  Filled 2017-04-05 (×2): qty 1

## 2017-04-05 MED ORDER — LAMIVUDINE 10 MG/ML PO SOLN
100.0000 mg | Freq: Every day | ORAL | Status: DC
  Administered 2017-04-06: 100 mg via ORAL
  Filled 2017-04-05: qty 10

## 2017-04-05 MED ORDER — LAMIVUDINE 150 MG PO TABS
150.0000 mg | ORAL_TABLET | Freq: Once | ORAL | Status: AC
  Administered 2017-04-05: 150 mg via ORAL
  Filled 2017-04-05: qty 1

## 2017-04-05 MED ORDER — FUROSEMIDE 10 MG/ML IJ SOLN
80.0000 mg | Freq: Three times a day (TID) | INTRAMUSCULAR | Status: DC
  Administered 2017-04-05 – 2017-04-06 (×2): 80 mg via INTRAVENOUS
  Filled 2017-04-05 (×2): qty 8

## 2017-04-05 MED ORDER — HYDRALAZINE HCL 50 MG PO TABS
100.0000 mg | ORAL_TABLET | Freq: Three times a day (TID) | ORAL | Status: DC
  Administered 2017-04-05 – 2017-04-07 (×7): 100 mg via ORAL
  Filled 2017-04-05 (×7): qty 2

## 2017-04-05 MED ORDER — CARVEDILOL 12.5 MG PO TABS
12.5000 mg | ORAL_TABLET | Freq: Once | ORAL | Status: AC
  Administered 2017-04-05: 12.5 mg via ORAL
  Filled 2017-04-05: qty 1

## 2017-04-05 MED ORDER — CARVEDILOL 12.5 MG PO TABS
12.5000 mg | ORAL_TABLET | Freq: Two times a day (BID) | ORAL | Status: DC
  Administered 2017-04-05: 12.5 mg via ORAL
  Filled 2017-04-05: qty 1

## 2017-04-05 MED ORDER — ABACAVIR SULFATE 300 MG PO TABS
600.0000 mg | ORAL_TABLET | Freq: Every day | ORAL | Status: DC
  Administered 2017-04-05 – 2017-04-06 (×2): 600 mg via ORAL
  Filled 2017-04-05 (×2): qty 2

## 2017-04-05 MED ORDER — CARVEDILOL 25 MG PO TABS
25.0000 mg | ORAL_TABLET | Freq: Two times a day (BID) | ORAL | Status: DC
  Administered 2017-04-06 – 2017-04-07 (×3): 25 mg via ORAL
  Filled 2017-04-05 (×3): qty 1

## 2017-04-05 MED ORDER — HYDRALAZINE HCL 50 MG PO TABS
75.0000 mg | ORAL_TABLET | Freq: Three times a day (TID) | ORAL | Status: DC
  Administered 2017-04-05: 75 mg via ORAL
  Filled 2017-04-05: qty 1

## 2017-04-05 NOTE — Progress Notes (Signed)
Covering provider notified of BP-186/103. MD to follow up on nephrology consult rec's. Will continue to monitor.

## 2017-04-05 NOTE — Consult Note (Signed)
[facility] Consult Note     Date: 04/05/2017                  Patient Name:  [name] [name]  MRN: [id number]  DOB: 1970/05/01  Age / Sex: 47 y.o., male         PCP: Tawny Asal, MD            IMTS, Dr. Dareen Piano     Service Requesting Consult:                  Reason for Consult: AKI on CKD            Chief Complaint: LE edema  HPI: Pt is a 15 M with a PMH significant for HTN, DM II, h/o Hep B infection, HIV infection, and remote h/o syphilis who we are now seeing in consultation at the request of Dr. Dareen Piano for eval and recs regarding AKI on CKD in the setting of hypertensive urgency.    Pt was admitted to the hospital 1/19 for increasing LE edema, weight gain, and SOB.  It appears that bsaeline Cr has been around 3.0 since August 2018.  Cr on admission is 5.30.  He is diuresing well with IV Lasix.  We are asked to see.    In regards to his kidney disease, Cr was 1.22 in the early part of 2016.  Had an AKI in late 2016 in the setting of dehydration while taking lisinopril.  Cr was 2.46 in Feib 2018 and has been increasing slowly up to 3 really since that time.    He was referred to our practice in 09/2016 and was seen by Dr. Marval Regal.  He was taking Triumeq at that time for HIV which does not include tenofovir (still takes).  In regards to Hep B, looks like it's chronic active with + Hep B s Ag.  At his office visit with Dr. Marval Regal, workup was remarkable for + MPO ANCA at 11.6 and + ASO at 73.  Complements, anti dsDNA, and SPEP were all unremarkable.  UP/C was 3.9 g.    A kidney biopsy was attempted but BP was too high.  He has no-showed mult times for f/u and biopsy has not been able to be rescheduled.    Pt reports LE edema now.  No CP or SOB.  No joint pains, sinus symptoms, rashes.  Past Medical History:  Diagnosis Date  . Depression   . Diabetes mellitus    type II 2006  . Hepatitis B infection    hx of hepatitis infection  . HIV infection  ([place])   . Hyperlipidemia   . Hypertension   . Syphilis     History reviewed. No pertinent surgical history.  Family History  Problem Relation Age of Onset  . Coronary artery disease Maternal Grandfather   . Kidney disease Mother   . Diabetes Mother   . Heart disease Mother   . Kidney disease Father   . Diabetes Maternal Aunt   . Diabetes Maternal Uncle   . Diabetes Maternal Grandmother    Social History:  reports that  has never smoked. he has never used smokeless tobacco. He reports that he does not drink alcohol or use drugs.  Allergies: No Known Allergies  Medications Prior to Admission  Medication Sig Dispense Refill  . doxazosin (CARDURA) 2 MG tablet Take 1 tablet (2 mg total) by mouth at bedtime. 90 tablet 3  . furosemide (LASIX) 20 MG tablet Take  1 tablet (20 mg total) by mouth 2 (two) times daily. (Patient taking differently: Take 20-40 mg by mouth 2 (two) times daily. ) 180 tablet 3  . Insulin Glargine (BASAGLAR KWIKPEN) 100 UNIT/ML SOPN Inject 0.35 mLs (35 Units total) into the skin daily. (Patient taking differently: Inject 40 Units into the skin daily before breakfast. ) 30 mL 3  . KLOR-CON M20 20 MEQ tablet TAKE 1 TABLET BY MOUTH EVERY DAY (Patient taking differently: TAKE 1 TABLET (20 MEQ)  BY MOUTH EVERY DAY) 60 tablet 3  . ondansetron (ZOFRAN-ODT) 4 MG disintegrating tablet Take 4 mg by mouth every 8 (eight) hours as needed for nausea or vomiting.    . TRIUMEQ 600-50-300 MG tablet TAKE ONE TABLET BY MOUTH ONCE DAILY. STORE IN ORIGINAL BOTTLE AT [facility]. (Patient taking differently: TAKE ONE TABLET BY MOUTH ONCE DAILY AT BEDTIME. STORE IN ORIGINAL BOTTLE AT [facility].) 30 tablet 0  . verapamil (CALAN-SR) 240 MG CR tablet TAKE 1 TABLET (240 MG TOTAL) BY MOUTH DAILY. 90 tablet 3  . glucose blood test strip 1 each by Other route 2 (two) times daily. One touch verio iq.  And lancets 2/day 100 each 12  . Insulin Pen Needle (B-D UF III MINI PEN NEEDLES) 31G X 5  MM MISC Use to inject Lantus once daily 100 each 3  . KLOR-CON M20 20 MEQ tablet TAKE 1 TABLET BY MOUTH EVERY DAY (Patient not taking: Reported on 04/03/2017) 60 tablet 0  . ONETOUCH DELICA LANCETS MISC Use to check blood sugar up to 3 times daily Dx code: 250.00 100 each 12    Results for orders placed or performed during the hospital encounter of 04/04/17 (from the past 48 hour(s))  Urinalysis, Routine w reflex microscopic     Status: Abnormal   Collection Time: 04/03/17  7:40 PM  Result Value Ref Range   Color, Urine YELLOW YELLOW   APPearance CLEAR CLEAR   Specific Gravity, Urine 1.013 1.005 - 1.030   pH 6.0 5.0 - 8.0   Glucose, UA 50 (A) NEGATIVE mg/dL   Hgb urine dipstick MODERATE (A) NEGATIVE   Bilirubin Urine NEGATIVE NEGATIVE   Ketones, ur NEGATIVE NEGATIVE mg/dL   Protein, ur 100 (A) NEGATIVE mg/dL   Nitrite NEGATIVE NEGATIVE   Leukocytes, UA NEGATIVE NEGATIVE   RBC / HPF 6-30 0 - 5 RBC/hpf   WBC, UA 0-5 0 - 5 WBC/hpf   Bacteria, UA RARE (A) NONE SEEN   Squamous Epithelial / LPF NONE SEEN NONE SEEN   Mucus PRESENT    Hyaline Casts, UA PRESENT   Urine rapid drug screen (hosp performed)     Status: None   Collection Time: 04/03/17  7:40 PM  Result Value Ref Range   Opiates NONE DETECTED NONE DETECTED   Cocaine NONE DETECTED NONE DETECTED   Benzodiazepines NONE DETECTED NONE DETECTED   Amphetamines NONE DETECTED NONE DETECTED   Tetrahydrocannabinol NONE DETECTED NONE DETECTED   Barbiturates NONE DETECTED NONE DETECTED    Comment: (NOTE) DRUG SCREEN FOR MEDICAL PURPOSES ONLY.  IF CONFIRMATION IS NEEDED FOR ANY PURPOSE, NOTIFY LAB WITHIN 5 DAYS. LOWEST DETECTABLE LIMITS FOR URINE DRUG SCREEN Drug Class                     Cutoff (ng/mL) Amphetamine and metabolites    1000 Barbiturate and metabolites    200 Benzodiazepine                 026 Tricyclics and  metabolites     300 Opiates and metabolites        300 Cocaine and metabolites        300 THC                             50   Comprehensive metabolic panel     Status: Abnormal   Collection Time: 04/03/17  7:43 PM  Result Value Ref Range   Sodium 140 135 - 145 mmol/L   Potassium 3.6 3.5 - 5.1 mmol/L   Chloride 110 101 - 111 mmol/L   CO2 22 22 - 32 mmol/L   Glucose, Bld 54 (L) 65 - 99 mg/dL   BUN 37 (H) 6 - 20 mg/dL   Creatinine, Ser 5.38 (H) 0.61 - 1.24 mg/dL   Calcium 8.1 (L) 8.9 - 10.3 mg/dL   Total Protein 7.4 6.5 - 8.1 g/dL   Albumin 2.1 (L) 3.5 - 5.0 g/dL   AST 29 15 - 41 U/L   ALT 22 17 - 63 U/L   Alkaline Phosphatase 53 38 - 126 U/L   Total Bilirubin 0.7 0.3 - 1.2 mg/dL   GFR calc non Af Amer 11 (L) >60 mL/min   GFR calc Af Amer 13 (L) >60 mL/min    Comment: (NOTE) The eGFR has been calculated using the CKD EPI equation. This calculation has not been validated in all clinical situations. eGFR's persistently <60 mL/min signify possible Chronic Kidney Disease.    Anion gap 8 5 - 15  CBC with Differential     Status: Abnormal   Collection Time: 04/03/17  7:43 PM  Result Value Ref Range   WBC 8.5 4.0 - 10.5 K/uL   RBC 3.63 (L) 4.22 - 5.81 MIL/uL   Hemoglobin 8.8 (L) 13.0 - 17.0 g/dL   HCT 29.1 (L) 39.0 - 52.0 %   MCV 80.2 78.0 - 100.0 fL   MCH 24.2 (L) 26.0 - 34.0 pg   MCHC 30.2 30.0 - 36.0 g/dL   RDW 16.1 (H) 11.5 - 15.5 %   Platelets 339 150 - 400 K/uL   Neutrophils Relative % 71 %   Neutro Abs 6.1 1.7 - 7.7 K/uL   Lymphocytes Relative 20 %   Lymphs Abs 1.7 0.7 - 4.0 K/uL   Monocytes Relative 6 %   Monocytes Absolute 0.5 0.1 - 1.0 K/uL   Eosinophils Relative 2 %   Eosinophils Absolute 0.2 0.0 - 0.7 K/uL   Basophils Relative 1 %   Basophils Absolute 0.0 0.0 - 0.1 K/uL  Brain natriuretic peptide     Status: Abnormal   Collection Time: 04/03/17  7:43 PM  Result Value Ref Range   B Natriuretic Peptide 880.0 (H) 0.0 - 100.0 pg/mL  CK     Status: Abnormal   Collection Time: 04/04/17  4:00 AM  Result Value Ref Range   Total CK 1,469 (H) 49 - 397 U/L  I-Stat Troponin,  ED (not at [facility])     Status: Abnormal   Collection Time: 04/04/17  4:36 AM  Result Value Ref Range   Troponin i, poc 0.12 (HH) 0.00 - 0.08 ng/mL   Comment NOTIFIED PHYSICIAN    Comment 3            Comment: Due to the release kinetics of cTnI, a negative result within the first hours of the onset of symptoms does not rule out myocardial infarction with certainty. If myocardial infarction is  still suspected, repeat the test at appropriate intervals.   CBG monitoring, ED     Status: Abnormal   Collection Time: 04/04/17  7:41 AM  Result Value Ref Range   Glucose-Capillary 60 (L) 65 - 99 mg/dL  CBG monitoring, ED     Status: Abnormal   Collection Time: 04/04/17  9:34 AM  Result Value Ref Range   Glucose-Capillary 128 (H) 65 - 99 mg/dL  Basic metabolic panel     Status: Abnormal   Collection Time: 04/04/17 12:25 PM  Result Value Ref Range   Sodium 139 135 - 145 mmol/L   Potassium 3.6 3.5 - 5.1 mmol/L   Chloride 108 101 - 111 mmol/L   CO2 20 (L) 22 - 32 mmol/L   Glucose, Bld 92 65 - 99 mg/dL   BUN 37 (H) 6 - 20 mg/dL   Creatinine, Ser 5.39 (H) 0.61 - 1.24 mg/dL   Calcium 8.1 (L) 8.9 - 10.3 mg/dL   GFR calc non Af Amer 11 (L) >60 mL/min   GFR calc Af Amer 13 (L) >60 mL/min    Comment: (NOTE) The eGFR has been calculated using the CKD EPI equation. This calculation has not been validated in all clinical situations. eGFR's persistently <60 mL/min signify possible Chronic Kidney Disease.    Anion gap 11 5 - 15  Troponin I     Status: Abnormal   Collection Time: 04/04/17 12:25 PM  Result Value Ref Range   Troponin I 0.63 (HH) <0.03 ng/mL    Comment: CRITICAL RESULT CALLED TO, READ BACK BY AND VERIFIED WITH: RN C MCKAY AT [id number] [id number] MARTINB   Iron and TIBC     Status: Abnormal   Collection Time: 04/04/17 12:25 PM  Result Value Ref Range   Iron 30 (L) 45 - 182 ug/dL   TIBC 291 250 - 450 ug/dL   Saturation Ratios 10 (L) 17.9 - 39.5 %   UIBC 261 ug/dL  Ferritin     Status:  None   Collection Time: 04/04/17 12:25 PM  Result Value Ref Range   Ferritin 66 24 - 336 ng/mL  CBG monitoring, ED     Status: None   Collection Time: 04/04/17 12:57 PM  Result Value Ref Range   Glucose-Capillary 88 65 - 99 mg/dL  Glucose, capillary     Status: Abnormal   Collection Time: 04/04/17  4:48 PM  Result Value Ref Range   Glucose-Capillary 127 (H) 65 - 99 mg/dL   Comment 1 Notify RN    Comment 2 Document in Chart   Troponin I (q 6hr x 3)     Status: Abnormal   Collection Time: 04/04/17  6:14 PM  Result Value Ref Range   Troponin I 1.19 (HH) <0.03 ng/mL    Comment: CRITICAL VALUE NOTED.  VALUE IS CONSISTENT WITH PREVIOUSLY REPORTED AND CALLED VALUE.  Glucose, capillary     Status: Abnormal   Collection Time: 04/04/17  9:00 PM  Result Value Ref Range   Glucose-Capillary 157 (H) 65 - 99 mg/dL  Troponin I (q 6hr x 3)     Status: Abnormal   Collection Time: 04/05/17 12:31 AM  Result Value Ref Range   Troponin I 1.00 (HH) <0.03 ng/mL    Comment: CRITICAL VALUE NOTED.  VALUE IS CONSISTENT WITH PREVIOUSLY REPORTED AND CALLED VALUE.  Troponin I (q 6hr x 3)     Status: Abnormal   Collection Time: 04/05/17  4:42 AM  Result Value Ref Range   Troponin  I 0.96 (HH) <0.03 ng/mL    Comment: CRITICAL VALUE NOTED.  VALUE IS CONSISTENT WITH PREVIOUSLY REPORTED AND CALLED VALUE.  Basic metabolic panel     Status: Abnormal   Collection Time: 04/05/17  4:42 AM  Result Value Ref Range   Sodium 140 135 - 145 mmol/L   Potassium 3.7 3.5 - 5.1 mmol/L   Chloride 109 101 - 111 mmol/L   CO2 22 22 - 32 mmol/L   Glucose, Bld 120 (H) 65 - 99 mg/dL   BUN 39 (H) 6 - 20 mg/dL   Creatinine, Ser 5.61 (H) 0.61 - 1.24 mg/dL   Calcium 8.0 (L) 8.9 - 10.3 mg/dL   GFR calc non Af Amer 11 (L) >60 mL/min   GFR calc Af Amer 13 (L) >60 mL/min    Comment: (NOTE) The eGFR has been calculated using the CKD EPI equation. This calculation has not been validated in all clinical situations. eGFR's persistently  <60 mL/min signify possible Chronic Kidney Disease.    Anion gap 9 5 - 15  Glucose, capillary     Status: Abnormal   Collection Time: 04/05/17  6:22 AM  Result Value Ref Range   Glucose-Capillary 115 (H) 65 - 99 mg/dL   Comment 1 Notify RN   Glucose, capillary     Status: Abnormal   Collection Time: 04/05/17 11:57 AM  Result Value Ref Range   Glucose-Capillary 163 (H) 65 - 99 mg/dL   Dg Chest 2 View  Result Date: 04/03/2017 CLINICAL DATA:  Ankle swelling, water pills not helping. History of HIV, hypertension, hyperlipidemia and diabetes. EXAM: CHEST  2 VIEW COMPARISON:  Chest radiograph November 09, 2016 FINDINGS: Cardiac silhouette is upper limits of normal in size. Small bilateral pleural effusions. Pulmonary vascular congestion. Coarsened pulmonary interstitium without focal consolidation. No pneumothorax. Soft tissue planes and included osseous structures are non suspicious. IMPRESSION: Borderline cardiomegaly and pulmonary vascular congestion. Small pleural effusions. Coarsened pulmonary interstitium seen with pulmonary edema or, atypical infection without focal consolidation. Electronically Signed   By: Elon Alas M.D.   On: 04/03/2017 20:15   US Renal  Result Date: 04/05/2017 CLINICAL DATA:  Hypertension and diabetes. EXAM: RENAL / URINARY TRACT ULTRASOUND COMPLETE COMPARISON:  09/26/2016 FINDINGS: Right Kidney: Length: 13.5 cm. Increased echogenicity in the right kidney. Negative for hydronephrosis. Question a 0.7 cm hypoechoic cyst in the interpolar region but this has minimally changed. Left Kidney: Length: 13.4 cm. Left kidney is poorly visualized. Negative for hydronephrosis. Bladder: Appears normal for degree of bladder distention. Other: Bilateral pleural effusions. IMPRESSION: Increased echogenicity in the right kidney. Findings are suggestive for chronic medical renal disease. Limited evaluation of the left kidney. Negative for hydronephrosis. Pleural effusions. Question a  small cyst in the right kidney. Electronically Signed   By: Markus Daft M.D.   On: 04/05/2017 09:58    ROS: all other systems reviewed and are negative except as per HPI  Blood pressure (!) 175/101, pulse (!) 122, temperature 99.6 F (37.6 C), temperature source Oral, resp. rate 15, height 6' (1.829 m), weight 105.2 kg (232 lb), SpO2 98 %. Physical Exam  GEN NAD, sitting in chair HEENT EOMI, PERRL, MMM NECK + JVD PULM normal WOB, no O2.  Clear bilaterally no c/w/r CV RRR + S3 ABD mildly distended nontender EXT 3+ LE edema NEURO AAO x 3 nonfocal SKIN no rashes or lesions, healed skin abrasion on L shin  Assessment/Plan  1.  AKI on CKD IV: in the setting of hypertensive emergency.  We were trying to get a biopsy over the summer but now that Cr is in the 5s, not sure we are going to get something that will change management.  I'll do a UP/C, UA looks bland from admission.  Will do complements as well.  If looks like possible active GN on UA/ complements will consider biopsy.  I suspect will need dialysis relatively soon (next few months); he has a strong FH of ESRD.  Renal US with R kidney echogenicity, kidneys are both ~13 cm.  Hopefully if BP gets controlled better there will be some improvement in renal function for the time being.  2.  Hypertensive emergency: on hydralazine 100 mg q 8, cardura 2 mg QHS, and verapamil 240 mg daily.  No h/o Afib.  Will stop verapamil and start coreg 12.5 BID with likely need for uptitration to 25 mg BID.  Increase Lasix to 80 IV q 8 (wouldn't uptitrate given robust UOP).    3.  HIV: on Triumeq- no renal interactions  4.  Chronic active Hep B: doesn't appear to be MPGN type picture  Madelon Lips, MD [place] pgr [phone] 04/05/2017, 12:39 PM

## 2017-04-05 NOTE — Progress Notes (Signed)
   Subjective: [name] [name] feels that he is doing well today. States that his breathing and lower extremity edema are improving. He continues to have good urine output with the Lasix. He denies any headaches, visual changes, shortness of breath, abdominal pain. We discussed that his blood pressure continues to be elevated and that we will likely need to increase his blood pressure medicine even further. We also discussed consulting nephrology as he may be heading for hemodialysis. He agrees with the treatment plan. All questions and concerns addressed.  Objective: Vital signs in last 24 hours: Vitals:   04/05/17 0000 04/05/17 0100 04/05/17 0200 04/05/17 0300  BP: (!) 169/96 (!) 167/96 (!) 183/100 (!) 174/101  Pulse: (!) 110 (!) 108 (!) 103 98  Resp: 17 (!) 23 20 17   Temp:    99.6 F (37.6 C)  TempSrc:    Oral  SpO2: 99% 100% 97% 97%  Weight:      Height:       General: Obese male in no acute distress Pulm: Rales/rhonchi in the lower lung fields bilaterally CV: Tachycardic, regular rhythm, no murmurs or rubs Abdomen: Active bowel sounds, soft, nondistended, no tenderness to palpation Extremities: Moderate pitting edema bilateral  Assessment/Plan:  [name] [name] is a 47 year old male with chronic kidney disease stage IV and heart failure preserved ejection fraction who presented to the ED with signs and symptoms of hypertensive emergency. He continues to have difficult to control hypertension. Management as outlined below.  Hypertensive emergency.  - Continues to have high blood pressure above goal of 150-160/80-90 - Increase the patient's hydralazine to 100 mg 3 times daily - Continue Verapamil 240 mg  - Diuretic therapy as outlined below - UDS negative   HFpEF, acute exacerbation - Patient presented endorsing signs and symptoms of acute heart failure exacerbation including lower extremity edema, shortness of breath, and orthopnea.   - Echocardiogram yesterday illustrating an  ejection fraction of 55-60%, left atrial enlargement, and a small posterior pericardial effusion. Unfortunately the study was unable to assess diastolic dysfunction. - Patient is net 4.4 L since admission. - Continue IV furosemide 80 mg twice daily, - Strict I & O's   Acute on CKD Stage IV - Baseline GFR in the mid 20s. GFR on admission 13.  - Continuing to monitor. No acute indications for HD at this point. - Renal ultrasound and renal Doppler pending - Management with blood pressure control and diuresis. - Have consulted nephrology, appreciate the recommendations  Type 2 NSTEMI - EKG unchanged compared to prior - Troponin trend: 0.63 -> 1.19 - > 1.0  - Night team discussed the case with cardiology who felt the most likely etiology is demand ischemia secondary to hypertensive emergency. No need for heparin drip or nitro at this time.  Normocytic Anemia  - Hgb 8.8 with elevated RDW - Iron studies indicating iron deficiency anemia  HIV - Recent CD4 240, absolute 16 - Continue Trimeq  Type 2 diabetes mellitus -  Lantus 20 units QHS + SSI  Dispo: Anticipated discharge pending clinical improvement.   [name] Homes, MD 04/05/2017, 5:27 AM My Pager: [phone]

## 2017-04-05 NOTE — Progress Notes (Signed)
Rounding MD's notified of elevated BP's. Will continue to monitor.

## 2017-04-06 ENCOUNTER — Inpatient Hospital Stay (HOSPITAL_COMMUNITY): Payer: BC Managed Care – PPO

## 2017-04-06 ENCOUNTER — Other Ambulatory Visit: Payer: Self-pay

## 2017-04-06 DIAGNOSIS — I10 Essential (primary) hypertension: Secondary | ICD-10-CM

## 2017-04-06 LAB — BASIC METABOLIC PANEL
ANION GAP: 9 (ref 5–15)
BUN: 40 mg/dL — ABNORMAL HIGH (ref 6–20)
CALCIUM: 7.8 mg/dL — AB (ref 8.9–10.3)
CHLORIDE: 108 mmol/L (ref 101–111)
CO2: 22 mmol/L (ref 22–32)
Creatinine, Ser: 5.95 mg/dL — ABNORMAL HIGH (ref 0.61–1.24)
GFR calc non Af Amer: 10 mL/min — ABNORMAL LOW (ref >60)
GFR, EST AFRICAN AMERICAN: 12 mL/min — AB (ref >60)
Glucose, Bld: 129 mg/dL — ABNORMAL HIGH (ref 65–99)
Potassium: 3.4 mmol/L — ABNORMAL LOW (ref 3.5–5.1)
Sodium: 139 mmol/L (ref 135–145)

## 2017-04-06 LAB — CBC
HCT: 27.7 % — ABNORMAL LOW (ref 39.0–52.0)
HEMOGLOBIN: 8.5 g/dL — AB (ref 13.0–17.0)
MCH: 24.4 pg — AB (ref 26.0–34.0)
MCHC: 30.7 g/dL (ref 30.0–36.0)
MCV: 79.4 fL (ref 78.0–100.0)
Platelets: 310 10*3/uL (ref 150–400)
RBC: 3.49 MIL/uL — AB (ref 4.22–5.81)
RDW: 15.9 % — ABNORMAL HIGH (ref 11.5–15.5)
WBC: 7.6 10*3/uL (ref 4.0–10.5)

## 2017-04-06 LAB — GLUCOSE, CAPILLARY
GLUCOSE-CAPILLARY: 127 mg/dL — AB (ref 65–99)
Glucose-Capillary: 86 mg/dL (ref 65–99)
Glucose-Capillary: 137 mg/dL — ABNORMAL HIGH (ref 65–99)
Glucose-Capillary: 110 mg/dL — ABNORMAL HIGH (ref 65–99)

## 2017-04-06 LAB — C4 COMPLEMENT: Complement C4, Body Fluid: 19 mg/dL (ref 14–44)

## 2017-04-06 LAB — C3 COMPLEMENT: C3 COMPLEMENT: 115 mg/dL (ref 82–167)

## 2017-04-06 MED ORDER — FUROSEMIDE 80 MG PO TABS
80.0000 mg | ORAL_TABLET | Freq: Two times a day (BID) | ORAL | Status: DC
  Administered 2017-04-06 – 2017-04-07 (×3): 80 mg via ORAL
  Filled 2017-04-06 (×3): qty 1

## 2017-04-06 MED ORDER — DARBEPOETIN ALFA 150 MCG/0.3ML IJ SOSY
150.0000 ug | PREFILLED_SYRINGE | Freq: Once | INTRAMUSCULAR | Status: AC
  Administered 2017-04-06: 150 ug via SUBCUTANEOUS
  Filled 2017-04-06: qty 0.3

## 2017-04-06 MED ORDER — SODIUM CHLORIDE 0.9 % IV SOLN
510.0000 mg | Freq: Once | INTRAVENOUS | Status: AC
  Administered 2017-04-06: 510 mg via INTRAVENOUS
  Filled 2017-04-06: qty 17

## 2017-04-06 MED ORDER — POTASSIUM CHLORIDE CRYS ER 20 MEQ PO TBCR
40.0000 meq | EXTENDED_RELEASE_TABLET | Freq: Once | ORAL | Status: AC
  Administered 2017-04-06: 40 meq via ORAL
  Filled 2017-04-06: qty 2

## 2017-04-06 NOTE — Progress Notes (Signed)
Pt back from renal duplex.  BPs high 180s/110s.  MD team notified.  No new orders received at this time.

## 2017-04-06 NOTE — Discharge Summary (Signed)
Name: [name] [name] MRN: [id number] DOB: 1970-06-18 47 y.o. PCP: Tawny Asal, MD  Date of Admission: 04/04/2017  3:16 AM Date of Discharge:  Attending Physician: Axel Filler, *  Discharge Diagnosis: 1. Hypertensive emergency 2. HFpEF, Acute Exacerbation  3. AKI on CKD 4. 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   amLODipine 5 MG tablet Commonly known as:  NORVASC Take 1 tablet (5 mg total) by mouth daily.   BASAGLAR KWIKPEN 100 UNIT/ML Sopn Inject 0.35 mLs (35 Units total) into the skin daily. What changed:    how much to take  when to take this   carvedilol 25 MG tablet Commonly known as:  COREG Take 1 tablet (25 mg total) by mouth 2 (two) times daily with a meal.   dolutegravir 50 MG tablet Commonly known as:  TIVICAY Take 1 tablet (50 mg total) by mouth daily.   doxazosin 2 MG tablet Commonly known as:  CARDURA Take 1 tablet (2 mg total) by mouth at bedtime.   furosemide 80 MG tablet Commonly known as:  LASIX Take 1 tablet (80 mg total) by mouth 2 (two) times daily. What changed:    medication strength  how much to take  when to take this   glucose blood test strip 1 each by Other route 2 (two) times daily. One touch verio iq.  And lancets 2/day   hydrALAZINE 100 MG tablet Commonly known as:   APRESOLINE Take 1 tablet (100 mg total) by mouth every 8 (eight) hours.   Insulin Pen Needle 31G X 5 MM Misc Commonly known as:  B-D UF III MINI PEN NEEDLES Use to inject Lantus once daily   KLOR-CON M20 20 MEQ tablet Generic drug:  potassium chloride SA TAKE 1 TABLET BY MOUTH EVERY DAY What changed:  Another medication with the same name was removed. Continue taking this medication, and follow the directions you see here.   lamiVUDine 10 MG/ML solution Commonly known as:  EPIVIR Take 10 mLs (100 mg total) by mouth daily.   ondansetron 4 MG disintegrating tablet Commonly known as:  ZOFRAN-ODT Take 4 mg by mouth every 8 (eight) hours as needed for nausea or vomiting.   ONETOUCH DELICA LANCETS Misc Use to check blood sugar up to 3 times daily Dx code: 250.00     Disposition and follow-up:   [name] was discharged from [facility] in Stable condition.  At the hospital follow up visit please address:  1.  Hypertension. Continue to work towards getting up patient's blood pressure to goal of 130/80. Consider increasing his amlodipine at his follow-up visit. CKD. Please discuss with the patient how important it is that he follows up with nephrology. Heart failure preserved ejection fraction. Please assess the patient's volume status and determine whether his Lasix should be continued at the  current dose (80 mg twice daily).  2.  Labs / imaging needed at time of follow-up: BMP to check the patient's potassium and mag given the patient is on high-dose Lasix.  3.  Pending labs/ test needing follow-up: None  Follow-up Appointments: Follow-up Information    Tawny Asal, MD Follow up on 04/15/2017.   Specialty:  Internal Medicine Why:  @ 9:45 Contact information: [place] [address] [phone]        Campbell Riches, MD Follow up.   Specialty:  Infectious Diseases Contact information: [facility]  [id number] [phone]        [place], [place]. Call.   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 [name] [name] is a 47 year old male with chronic kidney disease stage IV and heart failure preserved ejection fraction who presented to the ED with signs and symptoms of hypertensive emergency.  Initially his blood pressure was found to be 216/121. He was found to have endorgan damage including a creatinine of 5.38 with elevated from a baseline of 3.5 and an elevated troponin of 0.12. He initially was started on nicardipine drip before being transitioned to oral therapy. He was started on Lasix 80 mg twice daily, hydralazine 100 mg 3 times daily, doxazosin 2 mg nightly, amlodipine 5mg  QD, and carvedilol 25 mg twice daily. His blood pressure subsequently stabilized at approximately 170/100. He will need further titration of his BP to get him at a goal of <130/80 (based on current guidelines for someone with CKD). At his follow-up visit you can consider increasing his Amlodipine to 10mg  QD.   HFpEF, Acute Exacerbation. Presentation the patient also voiced concerns shortness of breath, orthopnea and severe lower extremity edema. Echocardiogram was performed at that illustrated an EF of 55-60%, and left atrial enlargement. No valvular abnormalities were noted on echocardiogram and unfortunately study was unable to assess diastolic dysfunction. He was subsequently started on IV diuresis and responded well. He diuresed approximately 7 L while in the hospital. On discharge he was continued on 80 mg of furosemide twice daily. Please  reassess his volume status at his follow-up and determine whether his Lasix needs to be decreased or maintained at its current dosage.  AKI on CKD. Patient presented with an elevated creatinine of 5.38 compared to baseline 3.5. Nephrology was consulted as the patient will likely need hemodialysis within the next couple of months. 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 Patient will need to follow-up with nephrology as an outpatient.  Troponinemia. Initial labs illustrated an elevated troponin of 0.12 and subsequently trended up to a max of 1.19. Case was discussed with cardiology this is likely a type II MI secondary to demand ischemia. They recommended against starting a heparin drip at this point. Troponin subsequently trended down. The patient did not have any EKG changes compared to prior.  HIV. The patient's HIV medications were continued by his Lamivudine was renally dosed during his hospitalization. He was discharged on a new dose and instructed to  follow-up with Dr. [name] with infectious disease as soon as possible.   Discharge Vitals:   BP (!) 167/101 (BP Location: Right Arm)   Pulse (!) 42   Temp 99.5 F (37.5 C) (Oral)   Resp 11   Ht 6' (1.829 m)   Wt 227 lb 9.6 oz (103.2 kg)   SpO2 98%   BMI 30.87 kg/m   Pertinent Labs, Studies, and Procedures:   Echocardiogram  - Left ventricle: The cavity size was mildly dilated. Wall thickness was increased in a pattern of moderate LVH. Systolic function was normal. The estimated ejection fraction was in the range of 55% to 60%. The study is not technically sufficient to allow evaluation of LV diastolic function. - Left atrium: The atrium was moderately dilated. - Atrial septum: No defect or patent foramen ovale was identified. - Pericardium, extracardiac: Small mostly posterior pericardial  effusion.  Renal Ultrasound  - Increased echogenicity in the right kidney. Findings are suggestive for chronic medical renal disease. Limited evaluation of the left kidney. - Negative for hydronephrosis. - Pleural effusions. - Question a small cyst in the right kidney.  Renal Doppler - Right: No obvious evidence of hemodynamically significant renal artery stenosis. - Left: No obvious evidence of hemodynamically significant renal artery stenosis involving visualized segments. - Mesenteric: Normal Celiac artery and Superior Mesenteric artery findings.  Discharge Instructions: Discharge Instructions    (HEART FAILURE PATIENTS) Call MD:  Anytime you have any of the following symptoms: 1) 3 pound weight gain in 24 hours or 5 pounds in 1 week 2) shortness of breath, with or without a dry hacking cough 3) swelling in the hands, feet or stomach 4) if you have to sleep on extra pillows at night in order to breathe.   Complete by:  As directed    Diet - low sodium heart healthy   Complete by:  As directed    Increase activity slowly   Complete by:  As directed      Signed: Ina Homes, MD 04/07/2017, 11:47 AM   My Pager: [phone]

## 2017-04-06 NOTE — Progress Notes (Signed)
   Subjective: Patient is doing well this morning. Feels that his breathing and lower extremity edema resolved. He is requesting to go home today. We discussed that his blood pressure continues to be uncontrolled however it has improved. We discussed that we may need to add further medical management, and will get a renal Dopplers today. We discussed that we will collaborate with nephrology before making any changes. Patient understands that he will likely need to stay longer. All questions and concerns addressed.  Objective: Vital signs in last 24 hours: Vitals:   04/05/17 2110 04/06/17 0002 04/06/17 0349 04/06/17 0351  BP: (!) 172/105 (!) 164/89 (!) 183/112 (!) 177/103  Pulse: 88 88  85  Resp: 14 16 19 16   Temp: 99.7 F (37.6 C) 97.8 F (36.6 C)  99.1 F (37.3 C)  TempSrc: Oral Oral  Oral  SpO2: 96% 95%  99%  Weight:    229 lb 15 oz (104.3 kg)  Height:       General: Obese male in no acute distress Pulm: Clear breath sounds bilaterally CV: Regular rate and rhythm, no murmurs, no rubs Abdomen: Active bowel sounds, soft, nondistended, no tenderness to palpation Extremities: Trace pitting edema bilaterally  Assessment/Plan:  [name] [name] is a 47 year old male with chronic kidney disease stageIVand heart failure preserved ejection fraction who presented to the ED with signs and symptoms of hypertensive emergency. He continues to have difficult to control hypertension. Management as outlined below.  Hypertensive emergency. - Continues to have high blood pressure above goal of 150-160/80-90 - He is now on hydralazine 100 mg 3 times daily and carvedilol 25 mg twice daily - He is diuresed approximately 5.9 L, his diuretics were increased to 80 mg 3 times daily. However the patient now appears euvolemic.  We will discuss with nephrology transitioning to p.o. Lasix. - We may need to add clonidine to the patient's blood pressure regimen  HFpEF, acute exacerbation -Patient  presented endorsing signs and symptoms of acute heart failure exacerbation including lower extremity edema, shortness of breath, and orthopnea. -Echocardiogram yesterday illustrating an ejection fraction of 55-60%, left atrial enlargement and will get renal and a small posterior pericardial effusion. Unfortunately the study was unable to assess diastolic dysfunction. - Patient is net 5.9 L since admission. - Continue IV furosemide 80 mg 3 times daily -Strict I&O's   Acute onCKD Stage IV - Creatinine up to 5.9 this morning. -Continuing to monitor. No acute indications for HD at this point. - Renal ultrasound showed hypoechoic kidneys measuring 13 cm. Indicative of chronic renal disease - Protein to creatinine ratio 3.85 - Complement levels pending - Renal artery Doppler pending - Appreciate nephrology's recommendations.  Type 2 NSTEMI -EKG unchanged compared to prior - Troponin trend: 0.63 -> 1.19 - > 1.0  - No active chest pain  Normocytic Anemia  - Hgb 8.5 with elevated RDW - Iron studies indicating iron deficiency anemia  HIV -Recent CD4 240, absolute16 - Continue Trimeq  Type2 diabetes mellitus - Lantus 20 units QHS + SSI  Hypokalemia. Repleting  Dispo: Anticipated discharge in approximately >1 day(s).   Ina Homes, MD 04/06/2017, 5:44 AM My Pager: [phone]

## 2017-04-06 NOTE — Progress Notes (Signed)
*  PRELIMINARY RESULTS* Vascular Ultrasound Renal Artery Duplex has been completed.  There is no obvious evidence of hemodynamically significant renal artery stenosis.  04/06/2017 9:30 AM Maudry Mayhew, BS, RVT, RDCS, RDMS

## 2017-04-06 NOTE — Progress Notes (Signed)
Subjective:  BP still high- almost 3 liters of UOP- crt essentially stable although technically a little worse- pt feels better and wants to go home Objective Vital signs in last 24 hours: Vitals:   04/05/17 2110 04/06/17 0002 04/06/17 0349 04/06/17 0351  BP: (!) 172/105 (!) 164/89 (!) 183/112 (!) 177/103  Pulse: 88 88  85  Resp: 14 16 19 16   Temp: 99.7 F (37.6 C) 97.8 F (36.6 C)  99.1 F (37.3 C)  TempSrc: Oral Oral  Oral  SpO2: 96% 95%  99%  Weight:    104.3 kg (229 lb 15 oz)  Height:       Weight change: -0.934 kg (-1 oz)  Intake/Output Summary (Last 24 hours) at 04/06/2017 0859 Last data filed at 04/06/2017 0600 Gross per 24 hour  Intake 870 ml  Output 2800 ml  Net -1930 ml    Assessment/ Plan: Pt is a 47 y.o. yo male with history of CKD who was admitted on 04/04/2017 with worsening renal failure in the setting of malignant HTN/volume overload  Assessment/Plan: 1. 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  I suspect has had just progression of possible GN vs malignant HTN.  I also suspect the horse is out of the barn at this point so would not pursue biopsy as it would not really change the outcome unfortunately.  In addition he has not proved himself trustworthy to follow up.  I have told him that he will likely progress to being HD requiring in the next several months- he needs to follow up with nephrology to delay this and to ease the transition 2. HTN/vol- volume status improved- BP still high- recent changes in BP meds.  Coreg 25 BID, hydralazine 100 TID , cardura 2 q HS - lasix IV- will change to PO 3. Anemia- likley related to CKD- iron stores low, will replete and give ESA  4. Secondary hyperparathyroidism- check PTH and phos  and treat as needed  5.  If kidney function relatively stable tomorrow I would be OK with discharge- will set up follow up with Dr.  Margaree Mackintosh A    Labs: Basic Metabolic Panel: Recent Labs  Lab 04/04/17 1225 04/05/17 0442 04/06/17 0255  NA 139 140 139  K 3.6 3.7 3.4*  CL 108 109 108  CO2 20* 22 22  GLUCOSE 92 120* 129*  BUN 37* 39* 40*  CREATININE 5.39* 5.61* 5.95*  CALCIUM 8.1* 8.0* 7.8*   Liver Function Tests: Recent Labs  Lab 04/03/17 1943  AST 29  ALT 22  ALKPHOS 53  BILITOT 0.7  PROT 7.4  ALBUMIN 2.1*   No results for input(s): LIPASE, AMYLASE in the last 168 hours. No results for input(s): AMMONIA in the last 168 hours. CBC: Recent Labs  Lab 04/03/17 1943 04/06/17 0255  WBC 8.5 7.6  NEUTROABS 6.1  --   HGB 8.8* 8.5*  HCT 29.1* 27.7*  MCV 80.2 79.4  PLT 339 310   Cardiac Enzymes: Recent Labs  Lab 04/04/17 0400 04/04/17 1225 04/04/17 1814 04/05/17 0031 04/05/17 0442  CKTOTAL 1,469*  --   --   --   --   TROPONINI  --  0.63* 1.19* 1.00* 0.96*   CBG: Recent Labs  Lab 04/05/17 0622 04/05/17 1157 04/05/17 1624 04/05/17 2110 04/06/17 0608  GLUCAP 115* 163* 80 107* 86    Iron Studies:  Recent  Labs    04/04/17 1225  IRON 30*  TIBC 291  FERRITIN 66   Studies/Results: US Renal  Result Date: 04/05/2017 CLINICAL DATA:  Hypertension and diabetes. EXAM: RENAL / URINARY TRACT ULTRASOUND COMPLETE COMPARISON:  09/26/2016 FINDINGS: Right Kidney: Length: 13.5 cm. Increased echogenicity in the right kidney. Negative for hydronephrosis. Question a 0.7 cm hypoechoic cyst in the interpolar region but this has minimally changed. Left Kidney: Length: 13.4 cm. Left kidney is poorly visualized. Negative for hydronephrosis. Bladder: Appears normal for degree of bladder distention. Other: Bilateral pleural effusions. IMPRESSION: Increased echogenicity in the right kidney. Findings are suggestive for chronic medical renal disease. Limited evaluation of the left kidney. Negative for hydronephrosis. Pleural effusions. Question a small cyst in the right kidney. Electronically  Signed   By: Markus Daft M.D.   On: 04/05/2017 09:58   Medications: Infusions:   Scheduled Medications: . abacavir  600 mg Oral Daily   And  . dolutegravir  50 mg Oral Daily  . carvedilol  25 mg Oral BID WC  . doxazosin  2 mg Oral QHS  . enoxaparin (LOVENOX) injection  30 mg Subcutaneous Daily  . furosemide  80 mg Intravenous Q8H  . hydrALAZINE  100 mg Oral Q8H  . insulin aspart  0-9 Units Subcutaneous TID WC  . insulin glargine  20 Units Subcutaneous QHS  . lamiVUDine  100 mg Oral Daily    have reviewed scheduled and prn medications.  Physical Exam: General: alert, nad Heart: RRR Lungs: mostly clear Abdomen: soft, non tender Extremities: 1+ pitting edema Dialysis Access: none yet     04/06/2017,8:59 AM  LOS: 2 days

## 2017-04-07 DIAGNOSIS — R7989 Other specified abnormal findings of blood chemistry: Secondary | ICD-10-CM

## 2017-04-07 LAB — CBC
HEMATOCRIT: 28.5 % — AB (ref 39.0–52.0)
HEMOGLOBIN: 9 g/dL — AB (ref 13.0–17.0)
MCH: 25.1 pg — ABNORMAL LOW (ref 26.0–34.0)
MCHC: 31.6 g/dL (ref 30.0–36.0)
MCV: 79.4 fL (ref 78.0–100.0)
Platelets: 313 10*3/uL (ref 150–400)
RBC: 3.59 MIL/uL — ABNORMAL LOW (ref 4.22–5.81)
RDW: 15.8 % — ABNORMAL HIGH (ref 11.5–15.5)
WBC: 9 10*3/uL (ref 4.0–10.5)

## 2017-04-07 LAB — RENAL FUNCTION PANEL
ALBUMIN: 1.9 g/dL — AB (ref 3.5–5.0)
ANION GAP: 8 (ref 5–15)
BUN: 41 mg/dL — ABNORMAL HIGH (ref 6–20)
CO2: 23 mmol/L (ref 22–32)
Calcium: 7.9 mg/dL — ABNORMAL LOW (ref 8.9–10.3)
Chloride: 107 mmol/L (ref 101–111)
Creatinine, Ser: 6.25 mg/dL — ABNORMAL HIGH (ref 0.61–1.24)
GFR calc Af Amer: 11 mL/min — ABNORMAL LOW (ref >60)
GFR, EST NON AFRICAN AMERICAN: 10 mL/min — AB (ref >60)
Glucose, Bld: 123 mg/dL — ABNORMAL HIGH (ref 65–99)
PHOSPHORUS: 5 mg/dL — AB (ref 2.5–4.6)
POTASSIUM: 3.5 mmol/L (ref 3.5–5.1)
Sodium: 138 mmol/L (ref 135–145)

## 2017-04-07 LAB — GLUCOSE, CAPILLARY
GLUCOSE-CAPILLARY: 93 mg/dL (ref 65–99)
Glucose-Capillary: 79 mg/dL (ref 65–99)

## 2017-04-07 MED ORDER — ABACAVIR SULFATE 300 MG PO TABS: 600.0000 mg | ORAL_TABLET | Freq: Every day | ORAL | 0 refills | Status: DC

## 2017-04-07 MED ORDER — DOLUTEGRAVIR SODIUM 50 MG PO TABS: 50.0000 mg | ORAL_TABLET | Freq: Every day | ORAL | 0 refills | Status: DC

## 2017-04-07 MED ORDER — FUROSEMIDE 80 MG PO TABS: 80.0000 mg | ORAL_TABLET | Freq: Two times a day (BID) | ORAL | 2 refills | Status: DC

## 2017-04-07 MED ORDER — HYDRALAZINE HCL 100 MG PO TABS: 100.0000 mg | ORAL_TABLET | Freq: Three times a day (TID) | ORAL | 2 refills | Status: DC

## 2017-04-07 MED ORDER — LAMIVUDINE 10 MG/ML PO SOLN: 100.0000 mg | Freq: Every day | ORAL | 2 refills | Status: DC

## 2017-04-07 MED ORDER — CARVEDILOL 25 MG PO TABS: 25.0000 mg | ORAL_TABLET | Freq: Two times a day (BID) | ORAL | 2 refills | Status: DC

## 2017-04-07 MED ORDER — AMLODIPINE BESYLATE 5 MG PO TABS: 5.0000 mg | ORAL_TABLET | Freq: Every day | ORAL | 2 refills | Status: DC

## 2017-04-07 NOTE — Progress Notes (Signed)
Subjective:  BP still high- no UOP recorded- crt essentially stable although technically a little worse- pt feels better and wants to go home.  Renal artery duplex was negative- latest BP 163/89 Objective Vital signs in last 24 hours: Vitals:   04/06/17 2348 04/07/17 0508 04/07/17 0847 04/07/17 0904  BP: (!) 171/93 (!) 169/100 (!) 163/89   Pulse: 86 84 100 85  Resp: 17 18 15    Temp: 99.2 F (37.3 C) 99.5 F (37.5 C)    TempSrc: Oral Oral    SpO2:  100%    Weight:  103.2 kg (227 lb 9.6 oz)    Height:       Weight change: -1.061 kg (-5.4 oz)  Intake/Output Summary (Last 24 hours) at 04/07/2017 1601 Last data filed at 04/07/2017 0827 Gross per 24 hour  Intake 277 ml  Output -  Net 277 ml    Assessment/ Plan: Pt is a 47 y.o. yo male with history of CKD who was admitted on 04/04/2017 with worsening renal failure in the setting of malignant HTN/volume overload  Assessment/Plan: 1. 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  I suspect has had just progression of possible GN vs malignant HTN.  I also suspect the horse is out of the barn at this point so would not pursue biopsy as it would not really change the outcome unfortunately.  In addition he has not proved himself trustworthy to follow up.  I have told him that he will likely progress to being HD requiring in the next several months- he needs to follow up with nephrology to delay this and to ease the transition 2. HTN/vol- volume status improved- BP still high- recent changes in BP meds.  Coreg 25 BID, hydralazine 100 TID , cardura 2 q HS - lasix changed to PO 80 BID 3. Anemia- likley related to CKD- iron stores low, will replete- gave one dose  and gave one dose of ESA  4. Secondary hyperparathyroidism- check PTH - was 35 and phos- 5.0  and treat as needed  5. I am OK with discharge- will set up follow up with Dr. Geanie Kenning- will likely get labs  this Friday and a follow up next week    [place]: Basic Metabolic Panel: Recent Labs  Lab 04/05/17 0442 04/06/17 0255 04/07/17 0314  NA 140 139 138  K 3.7 3.4* 3.5  CL 109 108 107  CO2 22 22 23   GLUCOSE 120* 129* 123*  BUN 39* 40* 41*  CREATININE 5.61* 5.95* 6.25*  CALCIUM 8.0* 7.8* 7.9*  PHOS  --   --  5.0*   Liver Function Tests: Recent Labs  Lab 04/03/17 1943 04/07/17 0314  AST 29  --   ALT 22  --   ALKPHOS 53  --   BILITOT 0.7  --   PROT 7.4  --   ALBUMIN 2.1* 1.9*   No results for input(s): LIPASE, AMYLASE in the last 168 hours. No results for input(s): AMMONIA in the last 168 hours. CBC: Recent Labs  Lab 04/03/17 1943 04/06/17 0255 04/07/17 0314  WBC 8.5 7.6 9.0  NEUTROABS 6.1  --   --   HGB 8.8* 8.5* 9.0*  HCT 29.1* 27.7* 28.5*  MCV 80.2 79.4 79.4  PLT 339 310 313   Cardiac Enzymes: Recent Labs  Lab 04/04/17 0400 04/04/17 1225 04/04/17 1814 04/05/17 0031 04/05/17 0932  CKTOTAL 1,469*  --   --   --   --   TROPONINI  --  0.63* 1.19* 1.00* 0.96*   CBG: Recent Labs  Lab 04/06/17 0608 04/06/17 1205 04/06/17 1719 04/06/17 2224 04/07/17 0613  GLUCAP 86 137* 127* 110* 79    Iron Studies:  Recent Labs    04/04/17 1225  IRON 30*  TIBC 291  FERRITIN 66   Studies/Results: No results found. Medications: Infusions:   Scheduled Medications: . abacavir  600 mg Oral Daily   And  . dolutegravir  50 mg Oral Daily  . carvedilol  25 mg Oral BID WC  . doxazosin  2 mg Oral QHS  . enoxaparin (LOVENOX) injection  30 mg Subcutaneous Daily  . furosemide  80 mg Oral BID  . hydrALAZINE  100 mg Oral Q8H  . insulin aspart  0-9 Units Subcutaneous TID WC  . insulin glargine  20 Units Subcutaneous QHS  . lamiVUDine  100 mg Oral Daily    have reviewed scheduled and prn medications.  Physical Exam: General: alert, nad Heart: RRR Lungs: mostly clear Abdomen: soft, non tender Extremities: trace pitting edema Dialysis  Access: none yet     04/07/2017,9:11 AM  LOS: 3 days

## 2017-04-07 NOTE — Progress Notes (Signed)
   Subjective: The patient is doing well this AM. He states that his SOB, orthopnea, and LE have significantly improved. He is asking to go home. We discussed his various medication changes and the importance of good BP control. Discussed that he will need to follow-up with nephrology and infectious disease as an outpatient. The patient was given a work note. He voices understanding with the plan. All questions and concerns addressed.   Objective: Vital signs in last 24 hours: Vitals:   04/06/17 1530 04/06/17 2039 04/06/17 2348 04/07/17 0508  BP:  (!) 182/102 (!) 171/93 (!) 169/100  Pulse:  86 86 84  Resp: 14 20 17 18   Temp: 98.9 F (37.2 C) 99.1 F (37.3 C) 99.2 F (37.3 C) 99.5 F (37.5 C)  TempSrc: Oral Oral Oral Oral  SpO2:  98%  100%  Weight:    227 lb 9.6 oz (103.2 kg)  Height:       General: Obese male in no acute distress Pulm: Good air movement with no wheezing or crackles CV: RRR, no murmurs, no rubs  Abdomen: Active bowel sounds, soft, non-distended, no tenderness to palpation  Extremities: Trace pitting edema bilaterally   Assessment/Plan:  [name] [name] is a 47 year old male with chronic kidney disease stageIVand heart failure preserved ejection fraction who presented to the ED with signs and symptoms of hypertensive emergency.He continues to have difficult to control hypertension. Management as outlined below.  Hypertensive emergency. -Continues to have high blood pressure above goal of 130/80 - He is now on hydralazine 100 mg 3 times daily, doxazosin 2 mg QHS, and carvedilol 25 mg twice daily - He is diuresed approximately 7.7 L, his diuretics were changed to Furosemide 80 mg BID.   - Will add amlodipine 5 mg to BP regimen on discharge  HFpEF, acute exacerbation -Patientpresentedendorsing signs and symptoms of acute heart failure exacerbation including lower extremity edema, shortness of breath, and orthopnea. -Echocardiogramyesterday illustrating  an ejection fraction of 55-60%, left atrial enlargement and will get renal and a small posterior pericardial effusion. Unfortunately the study was unable to assess diastolic dysfunction. -Patient is net 6.2 L since admission. -Continue Furosemide 80 mg BID -Strict I&O's   Acute onCKD Stage IV - Creatinine up to 5.9 this morning. -Continuing to monitor. No acute indications for HD at this point. -Renal ultrasound showed hypoechoic kidneys measuring 13 cm. Indicative of chronic renal disease - Renal doppler showing no signs of RAS - Protein to creatinine ratio 3.85 - Complement WNL but C4 is on lower end of normal  - Appreciate nephrology's recommendations. Likely will need HD in the coming months   Type 2 NSTEMI -EKG unchanged compared to prior -Troponin trend:0.63 ->1.19 - >1.0  - No active chest pain  Normocytic Anemia  - Hgb 9.0 with elevated RDW -Iron studies indicating iron deficiency anemia  HIV -Recent CD4 240, absolute16 - HIV medications split as lamivudine is renally dosed.  - Needs to see ID as outpatient   Type2 diabetes mellitus - Lantus 20 units QHS + SSI  Hypokalemia. Repleting  Dispo: Anticipated discharge today.   Ina Homes, MD 04/07/2017, 6:12 AM My Pager: [phone]

## 2017-04-07 NOTE — Discharge Instructions (Signed)
Thank you for allowing [place] to provide your care. It is important that you take all your medications as prescribed and follow-up with you primary care doctor on 01/30 @ 9:45 am. We have made the following changes:  - START Amlodipine 5 mg daily   - START Hydralazine 100 mg three times a day   - START Carvedilol 25 mg twice daily   - START taking Furosemide 80 mg twice daily   Please call the kidney doctors and your infectious disease doctors to get your follow-up appointment with them as soon as possible.

## 2017-04-07 NOTE — Progress Notes (Signed)
D/c instructions given to patient, including new medications and heart failure education about diet and fluid restrictions. Iv removed, clean and intact. Telemetry removed.  Clyde Canterbury, RN

## 2017-04-08 LAB — PARATHYROID HORMONE, INTACT (NO CA): PTH: 173 pg/mL — ABNORMAL HIGH (ref 15–65)

## 2017-04-15 ENCOUNTER — Encounter: Payer: Self-pay | Admitting: Internal Medicine

## 2017-04-15 ENCOUNTER — Other Ambulatory Visit: Payer: Self-pay

## 2017-04-15 ENCOUNTER — Ambulatory Visit: Payer: BC Managed Care – PPO | Admitting: Internal Medicine

## 2017-04-15 VITALS — BP 142/80 | HR 87 | Temp 97.7°F | Ht 72.0 in | Wt 226.4 lb

## 2017-04-15 DIAGNOSIS — E8770 Fluid overload, unspecified: Secondary | ICD-10-CM

## 2017-04-15 DIAGNOSIS — R05 Cough: Secondary | ICD-10-CM

## 2017-04-15 DIAGNOSIS — I161 Hypertensive emergency: Secondary | ICD-10-CM | POA: Diagnosis not present

## 2017-04-15 DIAGNOSIS — Z794 Long term (current) use of insulin: Secondary | ICD-10-CM | POA: Diagnosis not present

## 2017-04-15 DIAGNOSIS — N185 Chronic kidney disease, stage 5: Secondary | ICD-10-CM

## 2017-04-15 DIAGNOSIS — N184 Chronic kidney disease, stage 4 (severe): Secondary | ICD-10-CM

## 2017-04-15 DIAGNOSIS — I5030 Unspecified diastolic (congestive) heart failure: Secondary | ICD-10-CM | POA: Diagnosis not present

## 2017-04-15 DIAGNOSIS — I10 Essential (primary) hypertension: Secondary | ICD-10-CM

## 2017-04-15 DIAGNOSIS — I130 Hypertensive heart and chronic kidney disease with heart failure and stage 1 through stage 4 chronic kidney disease, or unspecified chronic kidney disease: Secondary | ICD-10-CM | POA: Diagnosis not present

## 2017-04-15 DIAGNOSIS — N179 Acute kidney failure, unspecified: Secondary | ICD-10-CM

## 2017-04-15 DIAGNOSIS — Z79899 Other long term (current) drug therapy: Secondary | ICD-10-CM

## 2017-04-15 DIAGNOSIS — E0829 Diabetes mellitus due to underlying condition with other diabetic kidney complication: Secondary | ICD-10-CM | POA: Diagnosis not present

## 2017-04-15 DIAGNOSIS — R809 Proteinuria, unspecified: Secondary | ICD-10-CM

## 2017-04-15 LAB — GLUCOSE, CAPILLARY: GLUCOSE-CAPILLARY: 169 mg/dL — AB (ref 65–99)

## 2017-04-15 LAB — POCT GLYCOSYLATED HEMOGLOBIN (HGB A1C): Hemoglobin A1C: 5.7

## 2017-04-15 MED ORDER — FUROSEMIDE 80 MG PO TABS: ORAL_TABLET | ORAL | 2 refills | Status: DC

## 2017-04-15 NOTE — Assessment & Plan Note (Addendum)
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  He has established with nephrology and will likely progress to require dialysis, but we will try to delay this as long as possible.  He was volume overloaded during admission and despite 80 mg twice daily is continuing to accumulate fluid with lower extremity edema as well as basilar crackles on exam, though his weight is stable compared to discharge.  We will increase his Lasix dose and repeat labs today and assess response with close follow-up for further dose titration.  He was also instructed on daily weights to monitor his fluid status  --BMP --Increase Lasix to 120 mg am, 80 mg pm  --RTC 1-2 weeks for follow up --Daily weights

## 2017-04-15 NOTE — Assessment & Plan Note (Signed)
With signs of fluid overload on his recent admission and echo was obtained which showed a ejection fraction 55-60% with left atrial dilation, study was unable to fully assess diastolic function is presumed to have heart failure with preserved ejection fraction.  As described in chronic kidney disease assessment and plan, he continues to have signs of fluid overload and we will adjust diuretic regimen

## 2017-04-15 NOTE — Assessment & Plan Note (Signed)
Today's blood pressure is 142/80, improved from his hospitalization and prior clinic visits.  He is currently on amlodipine 5 mg, Coreg 25 mg twice daily, doxazosin 2 mg, hydralazine 100 mg 3 times daily, and his Lasix regimen.  Hopefully we will be able to prevent further episodes of hypertensive emergency leading to endorgan damage and progression of his kidney disease.  Will defer any changes to his hypertensive regimen given changes to his Lasix

## 2017-04-15 NOTE — Patient Instructions (Signed)
FOLLOW-UP INSTRUCTIONS When: 1-2 weeks  For: Follow up fluid status and Lasix dose  What to bring: Medications   Good to see you today [name] [name]   For your Lasix we are going to increase the morning dose to 120 mg and keep the second dose to 80 mg. Hopefully this will help the fluid in your legs and keep it from building up further to affect your breathing or kidneys. We will check labs to make sure your electrolytes are able to tolerate this increase dose. As another way to monitor your fluid status, if you have a scale you can take your weight each day and if your weight increases by 3 pounds in 24 hours or by 5 pounds in 1 week give the clinic a call so we can adjust your medications to decrease your fluid before needing to go to the hospital.  We will also check your A1c today.   We would like to see you back in 1 or 2 weeks to see how you are doing and if further adjustments are needed.

## 2017-04-15 NOTE — Assessment & Plan Note (Signed)
Most recent A1c was roughly 6 months ago was 6.8.  He reports doing well with his insulin regimen with 35 units glargine.  He denies episodes of hypoglycemia.  Repeat A1c today 5.7.  As it is a kidney disease progresses, he may need less insulin.  Based on pharmacy reconciliation had admission who appears his dosage has decreased 5 units on discharge compared to what the patient reported taking at home.  We will continue current regimen without changes for now.

## 2017-04-15 NOTE — Progress Notes (Signed)
   CC: Hospital follow up   HPI:  [name] is a 47 y.o. male with a past medical history as described below who presents to the clinic for hospital follow-up.  He was admitted from 1/19-1/22 for hypertensive emergency with volume overload and acute on chronic kidney injury.  His blood pressure medicines were continued and he was started on diuretics with removal of around 7 L during his hospitalization.  At discharge, he was prescribed Lasix 80 mg twice daily.  His hypertension regimen currently includes amlodipine, Coreg, doxazosin, hydralazine, and the newly prescribed Lasix.  He reports adherence to regimen changes.  His breathing has improved since hospital discharge but he does still note lower extremity edema which fluctuates and worsens after work.  He denies lightheadedness upon standing or presyncope.  He does note early URI symptoms including nonproductive cough, sore throat, congestion, denies fever.  He is discouraged as earlier today he met with his nephrologist who stated that hemodialysis is likely inevitable but that we will try to delay the progression as much as possible.  Past Medical History:  Diagnosis Date  . Depression   . Diabetes mellitus    type II 2006  . Hepatitis B infection    hx of hepatitis infection  . HIV infection ([place])   . Hyperlipidemia   . Hypertension   . Syphilis    Review of Systems:  Review of Systems  Constitutional: Negative for fever.  Respiratory: Positive for cough. Negative for shortness of breath.   Cardiovascular: Positive for leg swelling.  Neurological: Negative for dizziness.     Physical Exam:  Vitals:   04/15/17 0955  BP: (!) 142/80  Pulse: 87  Temp: 97.7 F (36.5 C)  TempSrc: Oral  SpO2: 99%  Weight: 226 lb 6.4 oz (102.7 kg)  Height: 6' (1.829 m)   General: Sitting in chair comfortably, no acute distress HEENT: Moist mucous membranes CV: Regular rate and rhythm, no murmur appreciated Resp: Bibasilar  crackles, normal work of breathing, no distress  Abd: +BS Extr: 3+ pitting edema to bilateral lower extremities up to the knee Neuro: Alert and oriented x3 Psych: Decreased mood with congruent affect Skin: Warm, dry      Assessment & Plan:   See Encounters Tab for problem based charting.  Patient discussed with Dr. Lynnae January

## 2017-04-16 ENCOUNTER — Other Ambulatory Visit: Payer: Self-pay

## 2017-04-16 DIAGNOSIS — Z01812 Encounter for preprocedural laboratory examination: Secondary | ICD-10-CM

## 2017-04-16 DIAGNOSIS — N185 Chronic kidney disease, stage 5: Secondary | ICD-10-CM

## 2017-04-16 DIAGNOSIS — Z01810 Encounter for preprocedural cardiovascular examination: Secondary | ICD-10-CM

## 2017-04-16 LAB — BMP8+ANION GAP
ANION GAP: 17 mmol/L (ref 10.0–18.0)
BUN/Creatinine Ratio: 8 — ABNORMAL LOW (ref 9–20)
BUN: 65 mg/dL — ABNORMAL HIGH (ref 6–24)
CO2: 20 mmol/L (ref 20–29)
CREATININE: 7.79 mg/dL — AB (ref 0.76–1.27)
Calcium: 8.1 mg/dL — ABNORMAL LOW (ref 8.7–10.2)
Chloride: 102 mmol/L (ref 96–106)
GFR, EST AFRICAN AMERICAN: 9 mL/min/{1.73_m2} — AB (ref >59)
GFR, EST NON AFRICAN AMERICAN: 7 mL/min/{1.73_m2} — AB (ref >59)
Glucose: 190 mg/dL — ABNORMAL HIGH (ref 65–99)
Potassium: 3.8 mmol/L (ref 3.5–5.2)
SODIUM: 139 mmol/L (ref 134–144)

## 2017-04-16 NOTE — Progress Notes (Signed)
Internal Medicine Clinic Attending  Case discussed with Dr. Harden at the time of the visit.  We reviewed the resident's history and exam and pertinent patient test results.  I agree with the assessment, diagnosis, and plan of care documented in the resident's note.  

## 2017-04-17 HISTORY — PX: TOTAL HIP ARTHROPLASTY: SHX124

## 2017-04-29 ENCOUNTER — Ambulatory Visit: Payer: BC Managed Care – PPO

## 2017-04-29 ENCOUNTER — Encounter: Payer: Self-pay | Admitting: Internal Medicine

## 2017-04-29 ENCOUNTER — Other Ambulatory Visit (HOSPITAL_COMMUNITY): Payer: Self-pay | Admitting: *Deleted

## 2017-04-29 NOTE — Discharge Instructions (Signed)
Darbepoetin Alfa injection °What is this medicine? °DARBEPOETIN ALFA (dar be POE e tin AL fa) helps your body make more red blood cells. It is used to treat anemia caused by chronic kidney failure and chemotherapy. °This medicine may be used for other purposes; ask your health care provider or pharmacist if you have questions. °COMMON BRAND NAME(S): Aranesp °What should I tell my health care provider before I take this medicine? °They need to know if you have any of these conditions: °-blood clotting disorders or history of blood clots °-cancer patient not on chemotherapy °-cystic fibrosis °-heart disease, such as angina, heart failure, or a history of a heart attack °-hemoglobin level of 12 g/dL or greater °-high blood pressure °-low levels of folate, iron, or vitamin B12 °-seizures °-an unusual or allergic reaction to darbepoetin, erythropoietin, albumin, hamster proteins, latex, other medicines, foods, dyes, or preservatives °-pregnant or trying to get pregnant °-breast-feeding °How should I use this medicine? °This medicine is for injection into a vein or under the skin. It is usually given by a health care professional in a hospital or clinic setting. °If you get this medicine at home, you will be taught how to prepare and give this medicine. Use exactly as directed. Take your medicine at regular intervals. Do not take your medicine more often than directed. °It is important that you put your used needles and syringes in a special sharps container. Do not put them in a trash can. If you do not have a sharps container, call your pharmacist or healthcare provider to get one. °A special MedGuide will be given to you by the pharmacist with each prescription and refill. Be sure to read this information carefully each time. °Talk to your pediatrician regarding the use of this medicine in children. While this medicine may be used in children as young as 1 year for selected conditions, precautions do  apply. °Overdosage: If you think you have taken too much of this medicine contact a poison control center or emergency room at once. °NOTE: This medicine is only for you. Do not share this medicine with others. °What if I miss a dose? °If you miss a dose, take it as soon as you can. If it is almost time for your next dose, take only that dose. Do not take double or extra doses. °What may interact with this medicine? °Do not take this medicine with any of the following medications: °-epoetin alfa °This list may not describe all possible interactions. Give your health care provider a list of all the medicines, herbs, non-prescription drugs, or dietary supplements you use. Also tell them if you smoke, drink alcohol, or use illegal drugs. Some items may interact with your medicine. °What should I watch for while using this medicine? °Your condition will be monitored carefully while you are receiving this medicine. °You may need blood work done while you are taking this medicine. °What side effects may I notice from receiving this medicine? °Side effects that you should report to your doctor or health care professional as soon as possible: °-allergic reactions like skin rash, itching or hives, swelling of the face, lips, or tongue °-breathing problems °-changes in vision °-chest pain °-confusion, trouble speaking or understanding °-feeling faint or lightheaded, falls °-high blood pressure °-muscle aches or pains °-pain, swelling, warmth in the leg °-rapid weight gain °-severe headaches °-sudden numbness or weakness of the face, arm or leg °-trouble walking, dizziness, loss of balance or coordination °-seizures (convulsions) °-swelling of the ankles, feet, hands °-  unusually weak or tired °Side effects that usually do not require medical attention (report to your doctor or health care professional if they continue or are bothersome): °-diarrhea °-fever, chills (flu-like symptoms) °-headaches °-nausea, vomiting °-redness,  stinging, or swelling at site where injected °This list may not describe all possible side effects. Call your doctor for medical advice about side effects. You may report side effects to FDA at 1-800-FDA-1088. °Where should I keep my medicine? °Keep out of the reach of children. °Store in a refrigerator between 2 and 8 degrees C (36 and 46 degrees F). Do not freeze. Do not shake. Throw away any unused portion if using a single-dose vial. Throw away any unused medicine after the expiration date. °NOTE: This sheet is a summary. It may not cover all possible information. If you have questions about this medicine, talk to your doctor, pharmacist, or health care provider. °© 2018 [employer] (2015-10-22 19:52:26) °Ferumoxytol injection °What is this medicine? °FERUMOXYTOL is an iron complex. Iron is used to make healthy red blood cells, which carry oxygen and nutrients throughout the body. This medicine is used to treat iron deficiency anemia in people with chronic kidney disease. °This medicine may be used for other purposes; ask your health care provider or pharmacist if you have questions. °COMMON BRAND NAME(S): Feraheme °What should I tell my health care provider before I take this medicine? °They need to know if you have any of these conditions: °-anemia not caused by low iron levels °-high levels of iron in the blood °-magnetic resonance imaging (MRI) test scheduled °-an unusual or allergic reaction to iron, other medicines, foods, dyes, or preservatives °-pregnant or trying to get pregnant °-breast-feeding °How should I use this medicine? °This medicine is for injection into a vein. It is given by a health care professional in a hospital or clinic setting. °Talk to your pediatrician regarding the use of this medicine in children. Special care may be needed. °Overdosage: If you think you have taken too much of this medicine contact a poison control center or emergency room at once. °NOTE: This medicine  is only for you. Do not share this medicine with others. °What if I miss a dose? °It is important not to miss your dose. Call your doctor or health care professional if you are unable to keep an appointment. °What may interact with this medicine? °This medicine may interact with the following medications: °-other iron products °This list may not describe all possible interactions. Give your health care provider a list of all the medicines, herbs, non-prescription drugs, or dietary supplements you use. Also tell them if you smoke, drink alcohol, or use illegal drugs. Some items may interact with your medicine. °What should I watch for while using this medicine? °Visit your doctor or healthcare professional regularly. Tell your doctor or healthcare professional if your symptoms do not start to get better or if they get worse. You may need blood work done while you are taking this medicine. °You may need to follow a special diet. Talk to your doctor. Foods that contain iron include: whole grains/cereals, dried fruits, beans, or peas, leafy green vegetables, and organ meats (liver, kidney). °What side effects may I notice from receiving this medicine? °Side effects that you should report to your doctor or health care professional as soon as possible: °-allergic reactions like skin rash, itching or hives, swelling of the face, lips, or tongue °-breathing problems °-changes in blood pressure °-feeling faint or lightheaded, falls °-fever or chills °-flushing,   sweating, or hot feelings °-swelling of the ankles or feet °Side effects that usually do not require medical attention (report to your doctor or health care professional if they continue or are bothersome): °-diarrhea °-headache °-nausea, vomiting °-stomach pain °This list may not describe all possible side effects. Call your doctor for medical advice about side effects. You may report side effects to FDA at 1-800-FDA-1088. °Where should I keep my medicine? °This drug  is given in a hospital or clinic and will not be stored at home. °NOTE: This sheet is a summary. It may not cover all possible information. If you have questions about this medicine, talk to your doctor, pharmacist, or health care provider. °© 2018 [employer] Standard (2015-04-05 12:41:49) ° °

## 2017-04-30 ENCOUNTER — Inpatient Hospital Stay (HOSPITAL_COMMUNITY)
Admission: RE | Admit: 2017-04-30 | Discharge: 2017-04-30 | Disposition: A | Discharge status: Home / Self Care | Payer: BC Managed Care – PPO | Source: Ambulatory Visit | Attending: Nephrology | Admitting: Nephrology

## 2017-04-30 ENCOUNTER — Encounter (HOSPITAL_COMMUNITY): Payer: Self-pay

## 2017-04-30 NOTE — Addendum Note (Signed)
Addended by: Truddie Crumble on: 04/30/2017 05:17 PM   Modules accepted: Orders

## 2017-05-01 ENCOUNTER — Encounter: Payer: Self-pay | Admitting: Internal Medicine

## 2017-05-04 ENCOUNTER — Other Ambulatory Visit: Payer: Self-pay | Admitting: Internal Medicine

## 2017-05-07 ENCOUNTER — Encounter (HOSPITAL_COMMUNITY)
Admission: RE | Admit: 2017-05-07 | Discharge: 2017-05-07 | Disposition: A | Discharge status: Home / Self Care | Payer: BC Managed Care – PPO | Source: Ambulatory Visit | Attending: Nephrology | Admitting: Nephrology

## 2017-05-07 VITALS — BP 142/71 | HR 79 | Temp 98.3°F | Resp 18 | Ht 72.0 in | Wt 219.0 lb

## 2017-05-07 DIAGNOSIS — Z79899 Other long term (current) drug therapy: Secondary | ICD-10-CM | POA: Diagnosis not present

## 2017-05-07 DIAGNOSIS — N189 Chronic kidney disease, unspecified: Secondary | ICD-10-CM | POA: Insufficient documentation

## 2017-05-07 DIAGNOSIS — N185 Chronic kidney disease, stage 5: Secondary | ICD-10-CM

## 2017-05-07 DIAGNOSIS — Z5181 Encounter for therapeutic drug level monitoring: Secondary | ICD-10-CM | POA: Diagnosis not present

## 2017-05-07 DIAGNOSIS — D631 Anemia in chronic kidney disease: Secondary | ICD-10-CM | POA: Insufficient documentation

## 2017-05-07 LAB — POCT HEMOGLOBIN-HEMACUE: HEMOGLOBIN: 8.9 g/dL — AB (ref 13.0–17.0)

## 2017-05-07 MED ORDER — SODIUM CHLORIDE 0.9 % IV SOLN
510.0000 mg | INTRAVENOUS | Status: DC
  Administered 2017-05-07: 510 mg via INTRAVENOUS
  Filled 2017-05-07: qty 17

## 2017-05-07 MED ORDER — DARBEPOETIN ALFA 60 MCG/0.3ML IJ SOSY
PREFILLED_SYRINGE | INTRAMUSCULAR | Status: AC
  Administered 2017-05-07: 60 ug via SUBCUTANEOUS
  Filled 2017-05-07: qty 0.3

## 2017-05-07 MED ORDER — DARBEPOETIN ALFA 60 MCG/0.3ML IJ SOSY
60.0000 ug | PREFILLED_SYRINGE | INTRAMUSCULAR | Status: DC
  Administered 2017-05-07: 60 ug via SUBCUTANEOUS

## 2017-05-07 NOTE — Discharge Instructions (Signed)
Ferumoxytol injection °What is this medicine? °FERUMOXYTOL is an iron complex. Iron is used to make healthy red blood cells, which carry oxygen and nutrients throughout the body. This medicine is used to treat iron deficiency anemia in people with chronic kidney disease. °This medicine may be used for other purposes; ask your health care provider or pharmacist if you have questions. °COMMON BRAND NAME(S): Feraheme °What should I tell my health care provider before I take this medicine? °They need to know if you have any of these conditions: °-anemia not caused by low iron levels °-high levels of iron in the blood °-magnetic resonance imaging (MRI) test scheduled °-an unusual or allergic reaction to iron, other medicines, foods, dyes, or preservatives °-pregnant or trying to get pregnant °-breast-feeding °How should I use this medicine? °This medicine is for injection into a vein. It is given by a health care professional in a hospital or clinic setting. °Talk to your pediatrician regarding the use of this medicine in children. Special care may be needed. °Overdosage: If you think you have taken too much of this medicine contact a poison control center or emergency room at once. °NOTE: This medicine is only for you. Do not share this medicine with others. °What if I miss a dose? °It is important not to miss your dose. Call your doctor or health care professional if you are unable to keep an appointment. °What may interact with this medicine? °This medicine may interact with the following medications: °-other iron products °This list may not describe all possible interactions. Give your health care provider a list of all the medicines, herbs, non-prescription drugs, or dietary supplements you use. Also tell them if you smoke, drink alcohol, or use illegal drugs. Some items may interact with your medicine. °What should I watch for while using this medicine? °Visit your doctor or healthcare professional regularly. Tell  your doctor or healthcare professional if your symptoms do not start to get better or if they get worse. You may need blood work done while you are taking this medicine. °You may need to follow a special diet. Talk to your doctor. Foods that contain iron include: whole grains/cereals, dried fruits, beans, or peas, leafy green vegetables, and organ meats (liver, kidney). °What side effects may I notice from receiving this medicine? °Side effects that you should report to your doctor or health care professional as soon as possible: °-allergic reactions like skin rash, itching or hives, swelling of the face, lips, or tongue °-breathing problems °-changes in blood pressure °-feeling faint or lightheaded, falls °-fever or chills °-flushing, sweating, or hot feelings °-swelling of the ankles or feet °Side effects that usually do not require medical attention (report to your doctor or health care professional if they continue or are bothersome): °-diarrhea °-headache °-nausea, vomiting °-stomach pain °This list may not describe all possible side effects. Call your doctor for medical advice about side effects. You may report side effects to FDA at 1-800-FDA-1088. °Where should I keep my medicine? °This drug is given in a hospital or clinic and will not be stored at home. °NOTE: This sheet is a summary. It may not cover all possible information. If you have questions about this medicine, talk to your doctor, pharmacist, or health care provider. °© 2018 [employer] (2015-04-05 12:41:49) °Darbepoetin Alfa injection °What is this medicine? °DARBEPOETIN ALFA (dar be POE e tin AL fa) helps your body make more red blood cells. It is used to treat anemia caused by chronic kidney failure and   chemotherapy. °This medicine may be used for other purposes; ask your health care provider or pharmacist if you have questions. °COMMON BRAND NAME(S): Aranesp °What should I tell my health care provider before I take this  medicine? °They need to know if you have any of these conditions: °-blood clotting disorders or history of blood clots °-cancer patient not on chemotherapy °-cystic fibrosis °-heart disease, such as angina, heart failure, or a history of a heart attack °-hemoglobin level of 12 g/dL or greater °-high blood pressure °-low levels of folate, iron, or vitamin B12 °-seizures °-an unusual or allergic reaction to darbepoetin, erythropoietin, albumin, hamster proteins, latex, other medicines, foods, dyes, or preservatives °-pregnant or trying to get pregnant °-breast-feeding °How should I use this medicine? °This medicine is for injection into a vein or under the skin. It is usually given by a health care professional in a hospital or clinic setting. °If you get this medicine at home, you will be taught how to prepare and give this medicine. Use exactly as directed. Take your medicine at regular intervals. Do not take your medicine more often than directed. °It is important that you put your used needles and syringes in a special sharps container. Do not put them in a trash can. If you do not have a sharps container, call your pharmacist or healthcare provider to get one. °A special MedGuide will be given to you by the pharmacist with each prescription and refill. Be sure to read this information carefully each time. °Talk to your pediatrician regarding the use of this medicine in children. While this medicine may be used in children as young as 1 year for selected conditions, precautions do apply. °Overdosage: If you think you have taken too much of this medicine contact a poison control center or emergency room at once. °NOTE: This medicine is only for you. Do not share this medicine with others. °What if I miss a dose? °If you miss a dose, take it as soon as you can. If it is almost time for your next dose, take only that dose. Do not take double or extra doses. °What may interact with this medicine? °Do not take this  medicine with any of the following medications: °-epoetin alfa °This list may not describe all possible interactions. Give your health care provider a list of all the medicines, herbs, non-prescription drugs, or dietary supplements you use. Also tell them if you smoke, drink alcohol, or use illegal drugs. Some items may interact with your medicine. °What should I watch for while using this medicine? °Your condition will be monitored carefully while you are receiving this medicine. °You may need blood work done while you are taking this medicine. °What side effects may I notice from receiving this medicine? °Side effects that you should report to your doctor or health care professional as soon as possible: °-allergic reactions like skin rash, itching or hives, swelling of the face, lips, or tongue °-breathing problems °-changes in vision °-chest pain °-confusion, trouble speaking or understanding °-feeling faint or lightheaded, falls °-high blood pressure °-muscle aches or pains °-pain, swelling, warmth in the leg °-rapid weight gain °-severe headaches °-sudden numbness or weakness of the face, arm or leg °-trouble walking, dizziness, loss of balance or coordination °-seizures (convulsions) °-swelling of the ankles, feet, hands °-unusually weak or tired °Side effects that usually do not require medical attention (report to your doctor or health care professional if they continue or are bothersome): °-diarrhea °-fever, chills (flu-like symptoms) °-headaches °-nausea, vomiting °-  redness, stinging, or swelling at site where injected °This list may not describe all possible side effects. Call your doctor for medical advice about side effects. You may report side effects to FDA at 1-800-FDA-1088. °Where should I keep my medicine? °Keep out of the reach of children. °Store in a refrigerator between 2 and 8 degrees C (36 and 46 degrees F). Do not freeze. Do not shake. Throw away any unused portion if using a single-dose  vial. Throw away any unused medicine after the expiration date. °NOTE: This sheet is a summary. It may not cover all possible information. If you have questions about this medicine, talk to your doctor, pharmacist, or health care provider. °© 2018 [employer] (2015-10-22 19:52:26) ° °

## 2017-05-18 ENCOUNTER — Encounter: Payer: BC Managed Care – PPO | Admitting: Vascular Surgery

## 2017-05-18 ENCOUNTER — Other Ambulatory Visit (HOSPITAL_COMMUNITY): Payer: BC Managed Care – PPO

## 2017-05-18 ENCOUNTER — Inpatient Hospital Stay (HOSPITAL_COMMUNITY): Admission: RE | Admit: 2017-05-18 | Payer: BC Managed Care – PPO | Source: Ambulatory Visit

## 2017-05-21 ENCOUNTER — Ambulatory Visit (HOSPITAL_COMMUNITY)
Admission: RE | Admit: 2017-05-21 | Discharge: 2017-05-21 | Disposition: A | Discharge status: Home / Self Care | Payer: BC Managed Care – PPO | Source: Ambulatory Visit | Attending: Nephrology | Admitting: Nephrology

## 2017-05-21 ENCOUNTER — Other Ambulatory Visit: Payer: Self-pay | Admitting: Internal Medicine

## 2017-05-21 DIAGNOSIS — D631 Anemia in chronic kidney disease: Secondary | ICD-10-CM | POA: Diagnosis not present

## 2017-05-21 DIAGNOSIS — N189 Chronic kidney disease, unspecified: Secondary | ICD-10-CM | POA: Diagnosis not present

## 2017-05-21 MED ORDER — SODIUM CHLORIDE 0.9 % IV SOLN
510.0000 mg | INTRAVENOUS | Status: DC
  Administered 2017-05-21: 11:00:00 510 mg via INTRAVENOUS
  Filled 2017-05-21: qty 17

## 2017-06-02 ENCOUNTER — Telehealth: Payer: Self-pay | Admitting: *Deleted

## 2017-06-02 NOTE — Telephone Encounter (Signed)
Received fax from [facility] stating following: "We have received a prescription for Tivicay. Patient is already on Triumeq. Please confirm that patient is to be on dolutegravir twice daily." L. Silvano Rusk, RN, BSN

## 2017-06-02 NOTE — Telephone Encounter (Signed)
He should be on Tivicay (dolutegravir) 50 mg daily. He was previously on Triumeq but this was altered as his lamivudine needed to be renally dosed with progression of his renal disease. He should continue to take the individual components (Tivicay 50 mg daily, Lamivudine 100 mg daily, and abacavir 600 mg daily) until he is able to follow up with Dr. [name] for further discussion and adjustment.

## 2017-06-04 ENCOUNTER — Ambulatory Visit (HOSPITAL_COMMUNITY)
Admission: RE | Admit: 2017-06-04 | Discharge: 2017-06-04 | Disposition: A | Discharge status: Home / Self Care | Payer: BC Managed Care – PPO | Source: Ambulatory Visit | Attending: Nephrology | Admitting: Nephrology

## 2017-06-04 VITALS — BP 175/89 | HR 69 | Temp 97.6°F | Resp 18

## 2017-06-04 DIAGNOSIS — D631 Anemia in chronic kidney disease: Secondary | ICD-10-CM | POA: Diagnosis present

## 2017-06-04 DIAGNOSIS — N185 Chronic kidney disease, stage 5: Secondary | ICD-10-CM | POA: Diagnosis not present

## 2017-06-04 LAB — DIFFERENTIAL
BASOS ABS: 0 10*3/uL (ref 0.0–0.1)
Basophils Relative: 1 %
EOS ABS: 0.2 10*3/uL (ref 0.0–0.7)
EOS PCT: 3 %
LYMPHS PCT: 33 %
Lymphs Abs: 2 10*3/uL (ref 0.7–4.0)
Monocytes Absolute: 0.4 10*3/uL (ref 0.1–1.0)
Monocytes Relative: 7 %
NEUTROS PCT: 56 %
Neutro Abs: 3.4 10*3/uL (ref 1.7–7.7)

## 2017-06-04 LAB — RENAL FUNCTION PANEL
ALBUMIN: 2.8 g/dL — AB (ref 3.5–5.0)
Anion gap: 6 (ref 5–15)
BUN: 48 mg/dL — ABNORMAL HIGH (ref 6–20)
CO2: 23 mmol/L (ref 22–32)
CREATININE: 5.98 mg/dL — AB (ref 0.61–1.24)
Calcium: 8.6 mg/dL — ABNORMAL LOW (ref 8.9–10.3)
Chloride: 107 mmol/L (ref 101–111)
GFR, EST AFRICAN AMERICAN: 12 mL/min — AB (ref >60)
GFR, EST NON AFRICAN AMERICAN: 10 mL/min — AB (ref >60)
Glucose, Bld: 143 mg/dL — ABNORMAL HIGH (ref 65–99)
Phosphorus: 5.3 mg/dL — ABNORMAL HIGH (ref 2.5–4.6)
Potassium: 3.7 mmol/L (ref 3.5–5.1)
Sodium: 136 mmol/L (ref 135–145)

## 2017-06-04 LAB — CBC
HCT: 31.6 % — ABNORMAL LOW (ref 39.0–52.0)
Hemoglobin: 9.8 g/dL — ABNORMAL LOW (ref 13.0–17.0)
MCH: 25.3 pg — AB (ref 26.0–34.0)
MCHC: 31 g/dL (ref 30.0–36.0)
MCV: 81.7 fL (ref 78.0–100.0)
PLATELETS: 244 10*3/uL (ref 150–400)
RBC: 3.87 MIL/uL — AB (ref 4.22–5.81)
RDW: 18.8 % — ABNORMAL HIGH (ref 11.5–15.5)
WBC: 6 10*3/uL (ref 4.0–10.5)

## 2017-06-04 MED ORDER — DARBEPOETIN ALFA 60 MCG/0.3ML IJ SOSY
PREFILLED_SYRINGE | INTRAMUSCULAR | Status: AC
  Administered 2017-06-04: 60 ug via SUBCUTANEOUS
  Filled 2017-06-04: qty 0.3

## 2017-06-04 MED ORDER — DARBEPOETIN ALFA 60 MCG/0.3ML IJ SOSY
60.0000 ug | PREFILLED_SYRINGE | INTRAMUSCULAR | Status: DC
  Administered 2017-06-04: 60 ug via SUBCUTANEOUS

## 2017-06-09 NOTE — Telephone Encounter (Addendum)
Pilar Plate, Pharmacist at [facility] notified of below. Hubbard Hartshorn, RN, BSN

## 2017-06-12 ENCOUNTER — Other Ambulatory Visit: Payer: BC Managed Care – PPO

## 2017-06-12 ENCOUNTER — Other Ambulatory Visit (HOSPITAL_COMMUNITY)
Admission: RE | Admit: 2017-06-12 | Discharge: 2017-06-12 | Disposition: A | Discharge status: Home / Self Care | Payer: BC Managed Care – PPO | Source: Ambulatory Visit | Attending: Infectious Diseases | Admitting: Infectious Diseases

## 2017-06-12 DIAGNOSIS — B20 Human immunodeficiency virus [HIV] disease: Secondary | ICD-10-CM

## 2017-06-12 DIAGNOSIS — Z113 Encounter for screening for infections with a predominantly sexual mode of transmission: Secondary | ICD-10-CM | POA: Insufficient documentation

## 2017-06-12 LAB — T-HELPER CELL (CD4) - (RCID CLINIC ONLY)
CD4 T CELL ABS: 390 /uL — AB (ref 400–2700)
CD4 T CELL HELPER: 21 % — AB (ref 33–55)

## 2017-06-15 LAB — URINE CYTOLOGY ANCILLARY ONLY
CHLAMYDIA, DNA PROBE: NEGATIVE
Neisseria Gonorrhea: NEGATIVE

## 2017-06-15 LAB — CBC WITH DIFFERENTIAL/PLATELET
BASOS PCT: 0.6 %
Basophils Absolute: 29 cells/uL (ref 0–200)
Eosinophils Absolute: 130 cells/uL (ref 15–500)
Eosinophils Relative: 2.7 %
HCT: 30.3 % — ABNORMAL LOW (ref 38.5–50.0)
Hemoglobin: 9.6 g/dL — ABNORMAL LOW (ref 13.2–17.1)
Lymphs Abs: 1502 cells/uL (ref 850–3900)
MCH: 25.6 pg — ABNORMAL LOW (ref 27.0–33.0)
MCHC: 31.7 g/dL — ABNORMAL LOW (ref 32.0–36.0)
MCV: 80.8 fL (ref 80.0–100.0)
MONOS PCT: 9.1 %
MPV: 9.1 fL (ref 7.5–12.5)
Neutro Abs: 2702 cells/uL (ref 1500–7800)
Neutrophils Relative %: 56.3 %
PLATELETS: 275 10*3/uL (ref 140–400)
RBC: 3.75 10*6/uL — ABNORMAL LOW (ref 4.20–5.80)
RDW: 19.5 % — ABNORMAL HIGH (ref 11.0–15.0)
TOTAL LYMPHOCYTE: 31.3 %
WBC mixed population: 437 cells/uL (ref 200–950)
WBC: 4.8 10*3/uL (ref 3.8–10.8)

## 2017-06-15 LAB — COMPLETE METABOLIC PANEL WITH GFR
AG Ratio: 0.7 (calc) — ABNORMAL LOW (ref 1.0–2.5)
ALKALINE PHOSPHATASE (APISO): 68 U/L (ref 40–115)
ALT: 13 U/L (ref 9–46)
AST: 10 U/L (ref 10–40)
Albumin: 3.1 g/dL — ABNORMAL LOW (ref 3.6–5.1)
BUN/Creatinine Ratio: 10 (calc) (ref 6–22)
BUN: 69 mg/dL — ABNORMAL HIGH (ref 7–25)
CO2: 24 mmol/L (ref 20–32)
CREATININE: 6.89 mg/dL — AB (ref 0.60–1.35)
Calcium: 8.8 mg/dL (ref 8.6–10.3)
Chloride: 105 mmol/L (ref 98–110)
GFR, Est African American: 10 mL/min/{1.73_m2} — ABNORMAL LOW (ref >=60)
GFR, Est Non African American: 9 mL/min/{1.73_m2} — ABNORMAL LOW (ref >=60)
GLOBULIN: 4.5 g/dL — AB (ref 1.9–3.7)
Glucose, Bld: 139 mg/dL — ABNORMAL HIGH (ref 65–99)
Potassium: 4.2 mmol/L (ref 3.5–5.3)
SODIUM: 135 mmol/L (ref 135–146)
Total Bilirubin: 0.2 mg/dL (ref 0.2–1.2)
Total Protein: 7.6 g/dL (ref 6.1–8.1)

## 2017-06-15 LAB — HIV-1 RNA QUANT-NO REFLEX-BLD
HIV 1 RNA QUANT: DETECTED {copies}/mL — AB
HIV-1 RNA QUANT, LOG: DETECTED {Log_copies}/mL — AB

## 2017-06-15 LAB — RPR TITER: RPR Titer: 1:4 {titer} — ABNORMAL HIGH

## 2017-06-15 LAB — RPR: RPR: REACTIVE — AB

## 2017-06-15 LAB — FLUORESCENT TREPONEMAL AB(FTA)-IGG-BLD: Fluorescent Treponemal ABS: REACTIVE — AB

## 2017-06-29 ENCOUNTER — Ambulatory Visit: Payer: BC Managed Care – PPO | Admitting: Infectious Diseases

## 2017-06-29 ENCOUNTER — Encounter: Payer: Self-pay | Admitting: Infectious Diseases

## 2017-06-29 DIAGNOSIS — A630 Anogenital (venereal) warts: Secondary | ICD-10-CM | POA: Insufficient documentation

## 2017-06-29 DIAGNOSIS — N185 Chronic kidney disease, stage 5: Secondary | ICD-10-CM

## 2017-06-29 DIAGNOSIS — I10 Essential (primary) hypertension: Secondary | ICD-10-CM | POA: Diagnosis not present

## 2017-06-29 DIAGNOSIS — B20 Human immunodeficiency virus [HIV] disease: Secondary | ICD-10-CM

## 2017-06-29 DIAGNOSIS — L409 Psoriasis, unspecified: Secondary | ICD-10-CM | POA: Diagnosis not present

## 2017-06-29 DIAGNOSIS — R809 Proteinuria, unspecified: Secondary | ICD-10-CM | POA: Diagnosis not present

## 2017-06-29 DIAGNOSIS — Z794 Long term (current) use of insulin: Secondary | ICD-10-CM

## 2017-06-29 DIAGNOSIS — E0829 Diabetes mellitus due to underlying condition with other diabetic kidney complication: Secondary | ICD-10-CM

## 2017-06-29 DIAGNOSIS — B181 Chronic viral hepatitis B without delta-agent: Secondary | ICD-10-CM | POA: Diagnosis not present

## 2017-06-29 NOTE — Assessment & Plan Note (Signed)
Will have him eval by derm per his request.

## 2017-06-29 NOTE — Assessment & Plan Note (Signed)
He is doing well on ART, components.  Given condoms Appreciate his multiple specialists.  Will check his Hep A Other vax are uptodate Will see him back in 6 months

## 2017-06-29 NOTE — Assessment & Plan Note (Signed)
Will have him seen by surgery

## 2017-06-29 NOTE — Assessment & Plan Note (Signed)
He is doing well by his hx Mostly committed to his diet.  apprecaite PCP f/u.

## 2017-06-29 NOTE — Assessment & Plan Note (Signed)
Will check his DNA

## 2017-06-29 NOTE — Assessment & Plan Note (Signed)
Cr stable Appreciate renal f/u.  Has LE edema.

## 2017-06-29 NOTE — Assessment & Plan Note (Signed)
Well controlled today Appreciate renal f/u.  Has LE edema.
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
## 2017-07-02 ENCOUNTER — Ambulatory Visit (HOSPITAL_COMMUNITY)
Admission: RE | Admit: 2017-07-02 | Discharge: 2017-07-02 | Disposition: A | Discharge status: Home / Self Care | Payer: BC Managed Care – PPO | Source: Ambulatory Visit | Attending: Nephrology | Admitting: Nephrology

## 2017-07-02 VITALS — BP 142/75 | HR 65 | Temp 97.5°F | Resp 18

## 2017-07-02 DIAGNOSIS — D631 Anemia in chronic kidney disease: Secondary | ICD-10-CM | POA: Insufficient documentation

## 2017-07-02 DIAGNOSIS — N185 Chronic kidney disease, stage 5: Secondary | ICD-10-CM | POA: Diagnosis present

## 2017-07-02 LAB — RENAL FUNCTION PANEL
ALBUMIN: 2.7 g/dL — AB (ref 3.5–5.0)
ANION GAP: 7 (ref 5–15)
BUN: 52 mg/dL — ABNORMAL HIGH (ref 6–20)
CHLORIDE: 105 mmol/L (ref 101–111)
CO2: 23 mmol/L (ref 22–32)
Calcium: 8.7 mg/dL — ABNORMAL LOW (ref 8.9–10.3)
Creatinine, Ser: 6.23 mg/dL — ABNORMAL HIGH (ref 0.61–1.24)
GFR calc Af Amer: 11 mL/min — ABNORMAL LOW (ref >60)
GFR, EST NON AFRICAN AMERICAN: 10 mL/min — AB (ref >60)
Glucose, Bld: 310 mg/dL — ABNORMAL HIGH (ref 65–99)
PHOSPHORUS: 4 mg/dL (ref 2.5–4.6)
POTASSIUM: 4.1 mmol/L (ref 3.5–5.1)
Sodium: 135 mmol/L (ref 135–145)

## 2017-07-02 LAB — CBC
HEMATOCRIT: 32 % — AB (ref 39.0–52.0)
HEMOGLOBIN: 10 g/dL — AB (ref 13.0–17.0)
MCH: 25.8 pg — ABNORMAL LOW (ref 26.0–34.0)
MCHC: 31.3 g/dL (ref 30.0–36.0)
MCV: 82.7 fL (ref 78.0–100.0)
Platelets: 229 10*3/uL (ref 150–400)
RBC: 3.87 MIL/uL — AB (ref 4.22–5.81)
RDW: 19.2 % — ABNORMAL HIGH (ref 11.5–15.5)
WBC: 5.7 10*3/uL (ref 4.0–10.5)

## 2017-07-02 LAB — DIFFERENTIAL
BASOS ABS: 0 10*3/uL (ref 0.0–0.1)
BASOS PCT: 0 %
Eosinophils Absolute: 0.2 10*3/uL (ref 0.0–0.7)
Eosinophils Relative: 3 %
LYMPHS ABS: 1.5 10*3/uL (ref 0.7–4.0)
LYMPHS PCT: 26 %
MONOS PCT: 7 %
Monocytes Absolute: 0.4 10*3/uL (ref 0.1–1.0)
NEUTROS ABS: 3.6 10*3/uL (ref 1.7–7.7)
Neutrophils Relative %: 64 %

## 2017-07-02 LAB — IRON AND TIBC
Iron: 57 ug/dL (ref 45–182)
Saturation Ratios: 22 % (ref 17.9–39.5)
TIBC: 255 ug/dL (ref 250–450)
UIBC: 198 ug/dL

## 2017-07-02 LAB — FERRITIN: FERRITIN: 121 ng/mL (ref 24–336)

## 2017-07-02 MED ORDER — DARBEPOETIN ALFA 60 MCG/0.3ML IJ SOSY
60.0000 ug | PREFILLED_SYRINGE | INTRAMUSCULAR | Status: DC
  Administered 2017-07-02: 60 ug via SUBCUTANEOUS

## 2017-07-02 MED ORDER — DARBEPOETIN ALFA 60 MCG/0.3ML IJ SOSY
PREFILLED_SYRINGE | INTRAMUSCULAR | Status: AC
Start: 2017-07-02 — End: 2017-07-02
  Filled 2017-07-02: qty 0.3

## 2017-07-03 ENCOUNTER — Telehealth: Payer: Self-pay | Admitting: Internal Medicine

## 2017-07-03 MED ORDER — AMLODIPINE BESYLATE 5 MG PO TABS: 5.0000 mg | ORAL_TABLET | Freq: Every day | ORAL | 3 refills | Status: DC

## 2017-07-03 NOTE — Telephone Encounter (Signed)
   Reason for call:   I received a call from Mr. Maylon Cos at 9:24 AM indicating he needs a refill on his blood pressure medication amlodipine.   Pertinent Data:   Requesting a refill on amlodipine.  States he checked his blood pressure yesterday and it was 133/76.  No other complaints.   Assessment / Plan / Recommendations:   Amlodipine 5 mg daily has been refilled.  Prescription sent to [facility] on [address].  As always, pt is advised that if symptoms worsen or new symptoms arise, they should go to an urgent care facility or to to ER for further evaluation.   Shela Leff, MD   07/03/2017, 9:36 AM

## 2017-07-06 ENCOUNTER — Other Ambulatory Visit: Payer: Self-pay | Admitting: Internal Medicine

## 2017-07-13 ENCOUNTER — Emergency Department (HOSPITAL_COMMUNITY)
Admission: EM | Admit: 2017-07-13 | Discharge: 2017-07-14 | Disposition: A | Discharge status: Home / Self Care | Payer: BC Managed Care – PPO | Attending: Emergency Medicine | Admitting: Emergency Medicine

## 2017-07-13 ENCOUNTER — Encounter (HOSPITAL_COMMUNITY): Payer: Self-pay

## 2017-07-13 ENCOUNTER — Emergency Department (HOSPITAL_COMMUNITY): Payer: BC Managed Care – PPO

## 2017-07-13 ENCOUNTER — Other Ambulatory Visit: Payer: Self-pay

## 2017-07-13 DIAGNOSIS — R61 Generalized hyperhidrosis: Secondary | ICD-10-CM | POA: Diagnosis present

## 2017-07-13 DIAGNOSIS — B20 Human immunodeficiency virus [HIV] disease: Secondary | ICD-10-CM | POA: Insufficient documentation

## 2017-07-13 DIAGNOSIS — Y929 Unspecified place or not applicable: Secondary | ICD-10-CM | POA: Insufficient documentation

## 2017-07-13 DIAGNOSIS — E119 Type 2 diabetes mellitus without complications: Secondary | ICD-10-CM | POA: Diagnosis not present

## 2017-07-13 DIAGNOSIS — Y998 Other external cause status: Secondary | ICD-10-CM | POA: Diagnosis not present

## 2017-07-13 DIAGNOSIS — R410 Disorientation, unspecified: Secondary | ICD-10-CM | POA: Diagnosis not present

## 2017-07-13 DIAGNOSIS — R109 Unspecified abdominal pain: Secondary | ICD-10-CM | POA: Diagnosis not present

## 2017-07-13 DIAGNOSIS — Y9389 Activity, other specified: Secondary | ICD-10-CM | POA: Diagnosis not present

## 2017-07-13 DIAGNOSIS — I10 Essential (primary) hypertension: Secondary | ICD-10-CM | POA: Insufficient documentation

## 2017-07-13 DIAGNOSIS — E162 Hypoglycemia, unspecified: Secondary | ICD-10-CM | POA: Insufficient documentation

## 2017-07-13 HISTORY — DX: Asymptomatic human immunodeficiency virus (hiv) infection status: Z21

## 2017-07-13 HISTORY — DX: Human immunodeficiency virus (HIV) disease: B20

## 2017-07-13 HISTORY — DX: Type 2 diabetes mellitus without complications: E11.9

## 2017-07-13 LAB — I-STAT CHEM 8, ED
BUN: 43 mg/dL — ABNORMAL HIGH (ref 6–20)
Calcium, Ion: 1.19 mmol/L (ref 1.15–1.40)
Chloride: 108 mmol/L (ref 101–111)
Creatinine, Ser: 6.2 mg/dL — ABNORMAL HIGH (ref 0.61–1.24)
GLUCOSE: 79 mg/dL (ref 65–99)
HCT: 41 % (ref 39.0–52.0)
Hemoglobin: 13.9 g/dL (ref 13.0–17.0)
Potassium: 3.6 mmol/L (ref 3.5–5.1)
Sodium: 143 mmol/L (ref 135–145)
TCO2: 25 mmol/L (ref 22–32)

## 2017-07-13 LAB — COMPREHENSIVE METABOLIC PANEL
ALBUMIN: 3.1 g/dL — AB (ref 3.5–5.0)
ALK PHOS: 60 U/L (ref 38–126)
ALT: 20 U/L (ref 17–63)
AST: 18 U/L (ref 15–41)
Anion gap: 8 (ref 5–15)
BUN: 45 mg/dL — AB (ref 6–20)
CALCIUM: 9 mg/dL (ref 8.9–10.3)
CO2: 24 mmol/L (ref 22–32)
CREATININE: 6.22 mg/dL — AB (ref 0.61–1.24)
Chloride: 105 mmol/L (ref 101–111)
GFR calc non Af Amer: 10 mL/min — ABNORMAL LOW (ref >60)
GFR, EST AFRICAN AMERICAN: 11 mL/min — AB (ref >60)
GLUCOSE: 82 mg/dL (ref 65–99)
Potassium: 3.6 mmol/L (ref 3.5–5.1)
SODIUM: 137 mmol/L (ref 135–145)
Total Bilirubin: 0.5 mg/dL (ref 0.3–1.2)
Total Protein: 8.8 g/dL — ABNORMAL HIGH (ref 6.5–8.1)

## 2017-07-13 LAB — CBC
HCT: 38.4 % — ABNORMAL LOW (ref 39.0–52.0)
Hemoglobin: 12.3 g/dL — ABNORMAL LOW (ref 13.0–17.0)
MCH: 26.5 pg (ref 26.0–34.0)
MCHC: 32 g/dL (ref 30.0–36.0)
MCV: 82.8 fL (ref 78.0–100.0)
PLATELETS: 250 10*3/uL (ref 150–400)
RBC: 4.64 MIL/uL (ref 4.22–5.81)
RDW: 19.7 % — ABNORMAL HIGH (ref 11.5–15.5)
WBC: 8.9 10*3/uL (ref 4.0–10.5)

## 2017-07-13 LAB — URINALYSIS, ROUTINE W REFLEX MICROSCOPIC
BACTERIA UA: NONE SEEN
Bilirubin Urine: NEGATIVE
GLUCOSE, UA: NEGATIVE mg/dL
Ketones, ur: NEGATIVE mg/dL
Leukocytes, UA: NEGATIVE
Nitrite: NEGATIVE
PH: 7 (ref 5.0–8.0)
Protein, ur: 100 mg/dL — AB
RBC / HPF: >50 RBC/hpf — ABNORMAL HIGH (ref 0–5)
Specific Gravity, Urine: 1.008 (ref 1.005–1.030)

## 2017-07-13 LAB — PROTIME-INR
INR: 1.16
PROTHROMBIN TIME: 14.7 s (ref 11.4–15.2)

## 2017-07-13 LAB — CBG MONITORING, ED
GLUCOSE-CAPILLARY: 156 mg/dL — AB (ref 65–99)
GLUCOSE-CAPILLARY: 94 mg/dL (ref 65–99)
Glucose-Capillary: 110 mg/dL — ABNORMAL HIGH (ref 65–99)
Glucose-Capillary: 82 mg/dL (ref 65–99)

## 2017-07-13 LAB — SAMPLE TO BLOOD BANK

## 2017-07-13 LAB — CDS SEROLOGY

## 2017-07-13 LAB — ETHANOL

## 2017-07-13 LAB — I-STAT CG4 LACTIC ACID, ED: Lactic Acid, Venous: 0.89 mmol/L (ref 0.5–1.9)

## 2017-07-13 MED ORDER — DEXTROSE 10 % IV BOLUS
250.0000 mL | Freq: Once | INTRAVENOUS | Status: AC
  Administered 2017-07-13: 250 mL via INTRAVENOUS

## 2017-07-13 NOTE — ED Notes (Signed)
CBG 156

## 2017-07-13 NOTE — ED Notes (Signed)
Ct notified that pt is ready for CT
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
## 2017-07-13 NOTE — ED Notes (Signed)
CBG 94 
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
## 2017-07-13 NOTE — ED Notes (Signed)
Pt taken to CT.
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
## 2017-07-15 ENCOUNTER — Encounter: Payer: Self-pay | Admitting: Infectious Diseases

## 2017-07-16 ENCOUNTER — Other Ambulatory Visit: Payer: Self-pay | Admitting: Internal Medicine

## 2017-07-29 ENCOUNTER — Other Ambulatory Visit (HOSPITAL_COMMUNITY): Payer: Self-pay | Admitting: *Deleted

## 2017-07-30 ENCOUNTER — Encounter (HOSPITAL_COMMUNITY): Payer: BC Managed Care – PPO

## 2017-08-04 ENCOUNTER — Encounter (HOSPITAL_COMMUNITY)
Admission: RE | Admit: 2017-08-04 | Discharge: 2017-08-04 | Disposition: A | Discharge status: Home / Self Care | Payer: BC Managed Care – PPO | Source: Ambulatory Visit | Attending: Nephrology | Admitting: Nephrology

## 2017-08-04 VITALS — BP 157/85 | HR 65 | Temp 97.8°F | Resp 18

## 2017-08-04 DIAGNOSIS — N185 Chronic kidney disease, stage 5: Secondary | ICD-10-CM

## 2017-08-04 DIAGNOSIS — D631 Anemia in chronic kidney disease: Secondary | ICD-10-CM | POA: Insufficient documentation

## 2017-08-04 DIAGNOSIS — N189 Chronic kidney disease, unspecified: Secondary | ICD-10-CM | POA: Insufficient documentation

## 2017-08-04 LAB — RENAL FUNCTION PANEL
ANION GAP: 9 (ref 5–15)
Albumin: 2.8 g/dL — ABNORMAL LOW (ref 3.5–5.0)
BUN: 67 mg/dL — AB (ref 6–20)
CHLORIDE: 105 mmol/L (ref 101–111)
CO2: 23 mmol/L (ref 22–32)
Calcium: 8.8 mg/dL — ABNORMAL LOW (ref 8.9–10.3)
Creatinine, Ser: 6.8 mg/dL — ABNORMAL HIGH (ref 0.61–1.24)
GFR calc Af Amer: 10 mL/min — ABNORMAL LOW (ref >60)
GFR, EST NON AFRICAN AMERICAN: 9 mL/min — AB (ref >60)
Glucose, Bld: 266 mg/dL — ABNORMAL HIGH (ref 65–99)
POTASSIUM: 3.9 mmol/L (ref 3.5–5.1)
Phosphorus: 4.4 mg/dL (ref 2.5–4.6)
Sodium: 137 mmol/L (ref 135–145)

## 2017-08-04 LAB — CBC WITH DIFFERENTIAL/PLATELET
Abs Immature Granulocytes: 0 10*3/uL (ref 0.0–0.1)
Basophils Absolute: 0 10*3/uL (ref 0.0–0.1)
Basophils Relative: 1 %
EOS ABS: 0.1 10*3/uL (ref 0.0–0.7)
EOS PCT: 2 %
HEMATOCRIT: 35.8 % — AB (ref 39.0–52.0)
HEMOGLOBIN: 11.2 g/dL — AB (ref 13.0–17.0)
Immature Granulocytes: 0 %
LYMPHS ABS: 1.3 10*3/uL (ref 0.7–4.0)
Lymphocytes Relative: 22 %
MCH: 25.6 pg — ABNORMAL LOW (ref 26.0–34.0)
MCHC: 31.3 g/dL (ref 30.0–36.0)
MCV: 81.9 fL (ref 78.0–100.0)
Monocytes Absolute: 0.4 10*3/uL (ref 0.1–1.0)
Monocytes Relative: 6 %
Neutro Abs: 4.1 10*3/uL (ref 1.7–7.7)
Neutrophils Relative %: 69 %
Platelets: 249 10*3/uL (ref 150–400)
RBC: 4.37 MIL/uL (ref 4.22–5.81)
RDW: 16.5 % — AB (ref 11.5–15.5)
WBC: 6 10*3/uL (ref 4.0–10.5)

## 2017-08-04 MED ORDER — DARBEPOETIN ALFA 60 MCG/0.3ML IJ SOSY
PREFILLED_SYRINGE | INTRAMUSCULAR | Status: AC
  Administered 2017-08-04: 60 ug via SUBCUTANEOUS
  Filled 2017-08-04: qty 0.3

## 2017-08-04 MED ORDER — DARBEPOETIN ALFA 60 MCG/0.3ML IJ SOSY
60.0000 ug | PREFILLED_SYRINGE | INTRAMUSCULAR | Status: DC
  Administered 2017-08-04: 60 ug via SUBCUTANEOUS

## 2017-08-05 LAB — PTH, INTACT AND CALCIUM
CALCIUM TOTAL (PTH): 8.8 mg/dL (ref 8.7–10.2)
PTH: 89 pg/mL — ABNORMAL HIGH (ref 15–65)

## 2017-08-11 ENCOUNTER — Other Ambulatory Visit: Payer: Self-pay | Admitting: Internal Medicine

## 2017-08-12 ENCOUNTER — Other Ambulatory Visit: Payer: Self-pay | Admitting: *Deleted

## 2017-08-12 DIAGNOSIS — B20 Human immunodeficiency virus [HIV] disease: Secondary | ICD-10-CM

## 2017-08-12 MED ORDER — ABACAVIR SULFATE 300 MG PO TABS: 600.0000 mg | ORAL_TABLET | Freq: Every day | ORAL | 1 refills | Status: AC

## 2017-08-12 MED ORDER — DOLUTEGRAVIR SODIUM 50 MG PO TABS: 50.0000 mg | ORAL_TABLET | Freq: Every day | ORAL | 1 refills | Status: AC

## 2017-08-12 MED ORDER — LAMIVUDINE 10 MG/ML PO SOLN: 100.0000 mg | Freq: Every day | ORAL | 1 refills | Status: AC

## 2017-08-13 ENCOUNTER — Ambulatory Visit (INDEPENDENT_AMBULATORY_CARE_PROVIDER_SITE_OTHER): Payer: BC Managed Care – PPO | Admitting: Internal Medicine

## 2017-08-13 ENCOUNTER — Other Ambulatory Visit: Payer: Self-pay

## 2017-08-13 VITALS — BP 132/68 | HR 72 | Temp 98.5°F | Ht 72.0 in | Wt 201.9 lb

## 2017-08-13 DIAGNOSIS — E0829 Diabetes mellitus due to underlying condition with other diabetic kidney complication: Secondary | ICD-10-CM

## 2017-08-13 DIAGNOSIS — I10 Essential (primary) hypertension: Secondary | ICD-10-CM | POA: Diagnosis not present

## 2017-08-13 DIAGNOSIS — Z794 Long term (current) use of insulin: Secondary | ICD-10-CM | POA: Diagnosis not present

## 2017-08-13 DIAGNOSIS — R809 Proteinuria, unspecified: Secondary | ICD-10-CM

## 2017-08-13 LAB — POCT GLYCOSYLATED HEMOGLOBIN (HGB A1C): HEMOGLOBIN A1C: 7.3 % — AB (ref 4.0–5.6)

## 2017-08-13 LAB — GLUCOSE, CAPILLARY: GLUCOSE-CAPILLARY: 254 mg/dL — AB (ref 65–99)

## 2017-08-13 MED ORDER — CARVEDILOL 25 MG PO TABS: 25.0000 mg | ORAL_TABLET | Freq: Two times a day (BID) | ORAL | 11 refills | Status: AC

## 2017-08-13 MED ORDER — GLUCOSE BLOOD VI STRP: 1.0000 | ORAL_STRIP | Freq: Two times a day (BID) | 12 refills | Status: AC

## 2017-08-13 MED ORDER — HYDRALAZINE HCL 100 MG PO TABS: 100.0000 mg | ORAL_TABLET | Freq: Three times a day (TID) | ORAL | 11 refills | Status: AC

## 2017-08-13 MED ORDER — LANCETS 30G MISC: 1.0000 "application " | Freq: Three times a day (TID) | 11 refills | Status: AC

## 2017-08-13 MED ORDER — AMLODIPINE BESYLATE 10 MG PO TABS: 10.0000 mg | ORAL_TABLET | Freq: Every day | ORAL | 11 refills | Status: AC

## 2017-08-13 MED ORDER — INSULIN GLARGINE 100 UNITS/ML SOLOSTAR PEN: 10.0000 [IU] | PEN_INJECTOR | Freq: Every day | SUBCUTANEOUS | 11 refills | Status: AC

## 2017-08-13 MED ORDER — INSULIN PEN NEEDLE 31G X 5 MM MISC: 11 refills | Status: AC

## 2017-08-13 MED ORDER — FUROSEMIDE 80 MG PO TABS: ORAL_TABLET | ORAL | 11 refills | Status: AC

## 2017-08-13 MED ORDER — INSULIN GLARGINE 100 UNITS/ML SOLOSTAR PEN: 10.0000 [IU] | PEN_INJECTOR | Freq: Every day | SUBCUTANEOUS | 11 refills | Status: DC

## 2017-08-13 MED ORDER — INSULIN ASPART 100 UNIT/ML FLEXPEN: 3.0000 [IU] | PEN_INJECTOR | SUBCUTANEOUS | 11 refills | Status: AC | PRN

## 2017-08-13 NOTE — Patient Instructions (Signed)
[name] [name],  It was a pleasure meeting you today. Please follow up in one week and bring your meter.

## 2017-08-13 NOTE — Progress Notes (Signed)
   CC: Follow-up on type 2 diabetes  HPI:  [name] is a 47 y.o. male with history noted below that presents to the internal medicine clinic for follow-up on type 2 diabetes.  Please see problem based charting for the status of patient's chronic medical conditions.  Past Medical History:  Diagnosis Date  . Depression   . Diabetes mellitus    type II 2006  . Diabetes mellitus without complication ([place])   . Hepatitis B infection    hx of hepatitis infection  . HIV (human immunodeficiency virus infection) ([place])   . HIV infection ([place])   . Hyperlipidemia   . Hypertension   . Syphilis     Review of Systems:  Review of Systems  Constitutional: Negative for malaise/fatigue.  Respiratory: Negative for shortness of breath.   Cardiovascular: Negative for chest pain.  Gastrointestinal: Negative for nausea and vomiting.  Neurological: Negative for dizziness.     Physical Exam:  Vitals:   08/13/17 1457  BP: 132/68  Pulse: 72  Temp: 98.5 F (36.9 C)  TempSrc: Oral  SpO2: 100%  Weight: 201 lb 14.4 oz (91.6 kg)  Height: 6' (1.829 m)   Physical Exam  Constitutional: He is well-developed, well-nourished, and in no distress.  Cardiovascular: Normal rate, regular rhythm and normal heart sounds. Exam reveals no gallop and no friction rub.  No murmur heard. Pulmonary/Chest: Effort normal and breath sounds normal. No respiratory distress. He has no wheezes. He has no rales.  Skin: Skin is warm and dry.     Assessment & Plan:   See encounters tab for problem based medical decision making.    Patient discussed with Dr. Beryle Beams

## 2017-08-16 NOTE — Assessment & Plan Note (Signed)
Assessment:  Essential hypertension Patient currently takes amlodipine 10 mg, Coreg 25 mg twice daily, doxazosin 2 mg, hydralazine 100 mg 3 times daily, clonidine 0.2mg  TID, and his Lasix 120mg  am and 80mg  pm regimen.  Blood pressure today 132/68 stable and at goal.  Plan - continue current blood pressure medications
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
## 2017-08-17 ENCOUNTER — Other Ambulatory Visit: Payer: Self-pay

## 2017-08-17 DIAGNOSIS — N185 Chronic kidney disease, stage 5: Secondary | ICD-10-CM

## 2017-08-17 DIAGNOSIS — Z01812 Encounter for preprocedural laboratory examination: Secondary | ICD-10-CM

## 2017-08-17 NOTE — Progress Notes (Signed)
Medicine attending: Medical history, presenting problems, physical findings, and medications, reviewed with resident physician Dr Kalman Shan on the day of the patient visit and I concur with her evaluation and management plan. He is doing well despite his advanced medical problems.

## 2017-08-20 ENCOUNTER — Encounter (HOSPITAL_COMMUNITY): Payer: BC Managed Care – PPO

## 2017-08-20 ENCOUNTER — Other Ambulatory Visit (HOSPITAL_COMMUNITY): Payer: BC Managed Care – PPO

## 2017-08-24 ENCOUNTER — Other Ambulatory Visit: Payer: Self-pay | Admitting: Internal Medicine

## 2017-08-24 ENCOUNTER — Encounter: Payer: BC Managed Care – PPO | Admitting: Vascular Surgery

## 2017-08-24 DIAGNOSIS — B20 Human immunodeficiency virus [HIV] disease: Secondary | ICD-10-CM

## 2017-08-27 ENCOUNTER — Encounter (HOSPITAL_COMMUNITY): Payer: BC Managed Care – PPO

## 2017-08-28 ENCOUNTER — Encounter: Payer: BC Managed Care – PPO | Admitting: Vascular Surgery

## 2017-08-28 ENCOUNTER — Encounter (HOSPITAL_COMMUNITY): Payer: BC Managed Care – PPO

## 2017-08-28 ENCOUNTER — Other Ambulatory Visit (HOSPITAL_COMMUNITY): Payer: BC Managed Care – PPO

## 2017-09-01 ENCOUNTER — Encounter (HOSPITAL_COMMUNITY): Payer: BC Managed Care – PPO

## 2017-09-14 DEATH — deceased

## 2017-09-16 ENCOUNTER — Encounter (HOSPITAL_COMMUNITY): Payer: BC Managed Care – PPO

## 2017-09-16 ENCOUNTER — Other Ambulatory Visit (HOSPITAL_COMMUNITY): Payer: BC Managed Care – PPO

## 2017-09-16 ENCOUNTER — Encounter: Payer: BC Managed Care – PPO | Admitting: Vascular Surgery

## 2017-12-21 ENCOUNTER — Other Ambulatory Visit: Payer: BC Managed Care – PPO

## 2018-01-04 ENCOUNTER — Ambulatory Visit: Payer: BC Managed Care – PPO | Admitting: Infectious Diseases

## 2018-03-07 IMAGING — US US RENAL
1 series · 14 of 25 positions shown · non-contrast
Comparison: None

CLINICAL DATA: Stage III chronic kidney disease, diabetes mellitus,
hypertension, HIV

EXAM:
RENAL / URINARY TRACT ULTRASOUND COMPLETE

[Series 1: us renal · 0.23mm/px · 14 of 32 slices shown]
[im 1/32]
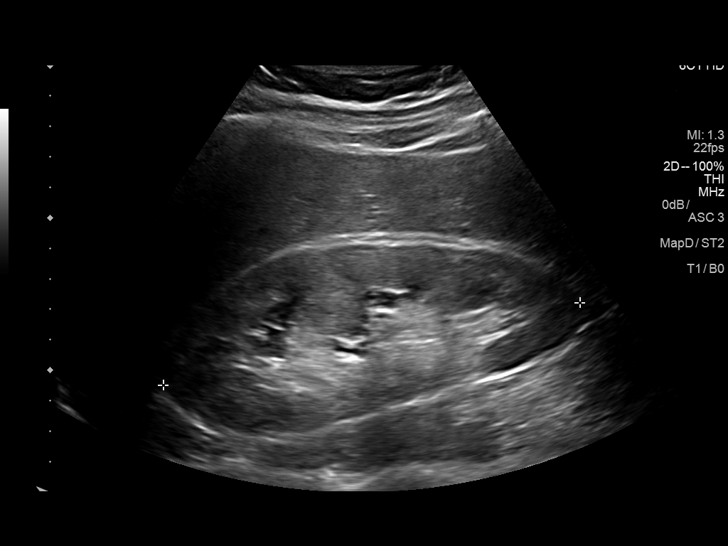
[im 3/32]
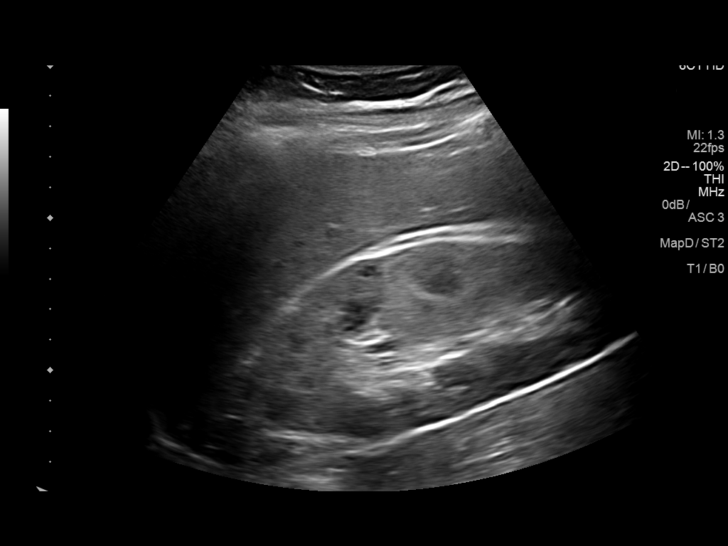
[im 6/32]
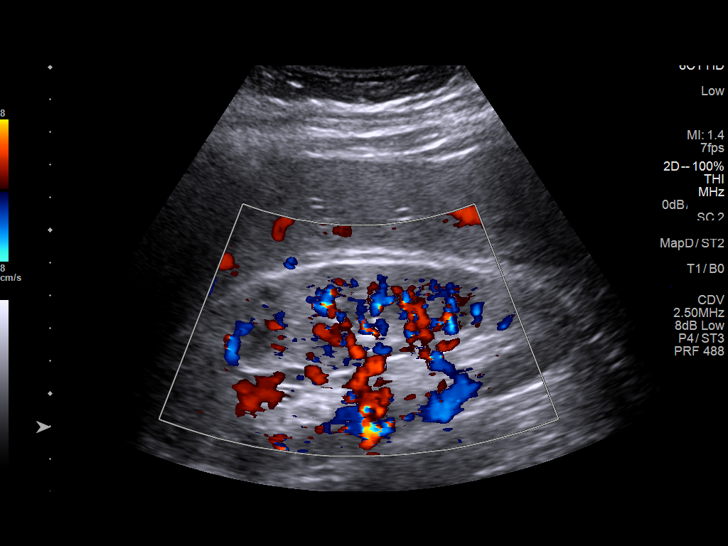
[im 8/32]
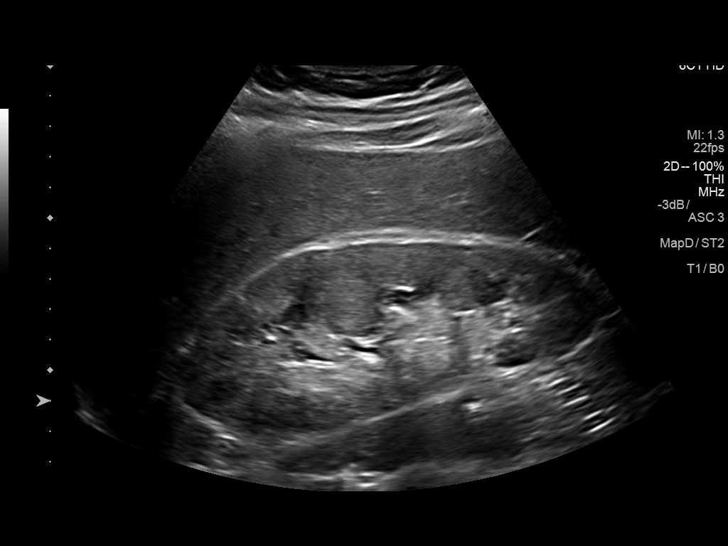
[im 11/32]
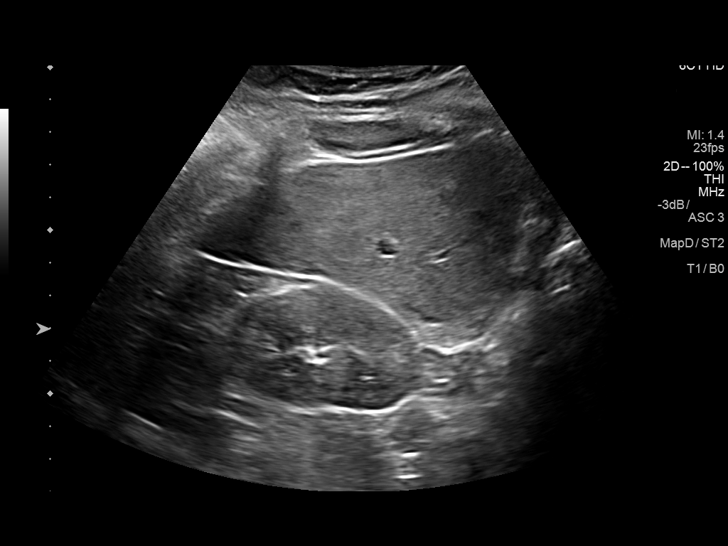
[im 12/32]
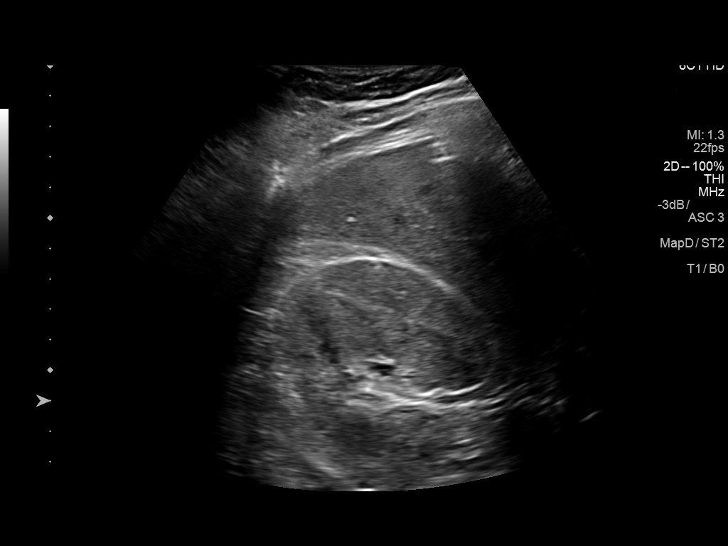
[im 15/32]
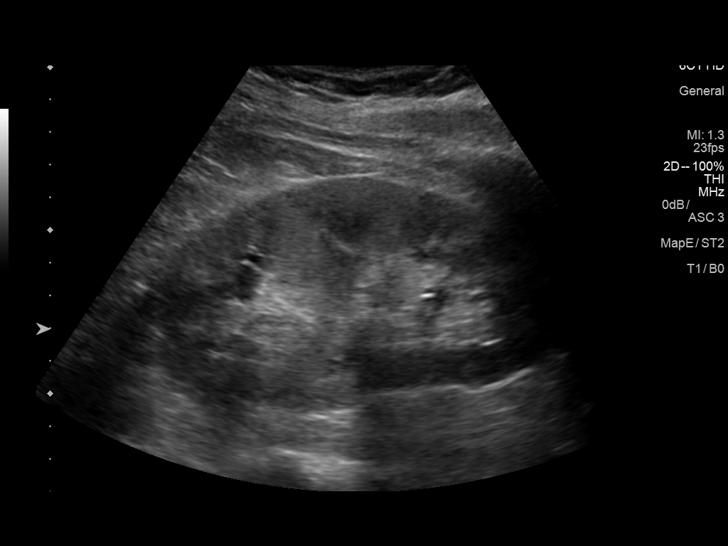
[im 17/32]
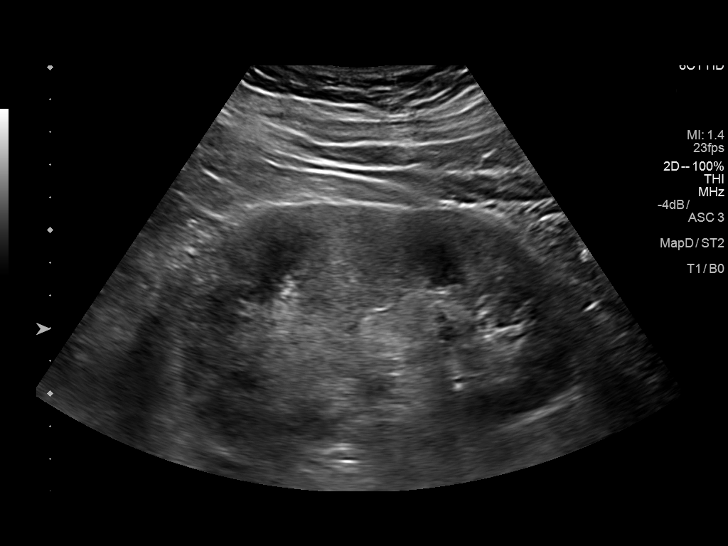
[im 20/32]
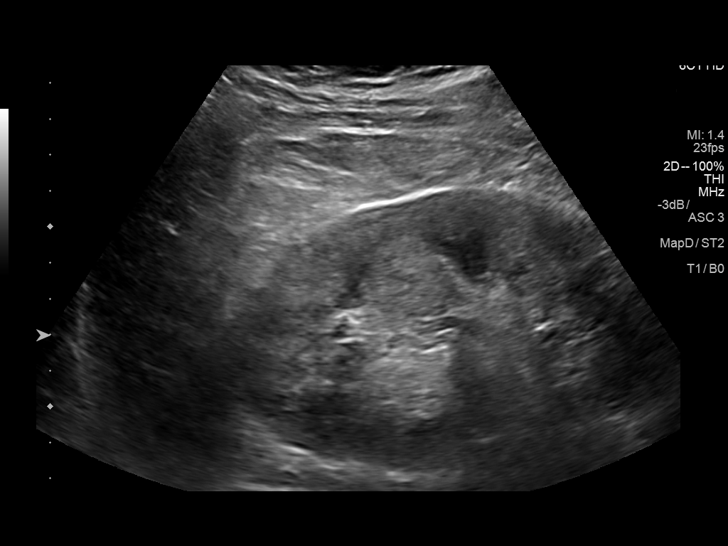
[im 21/32]
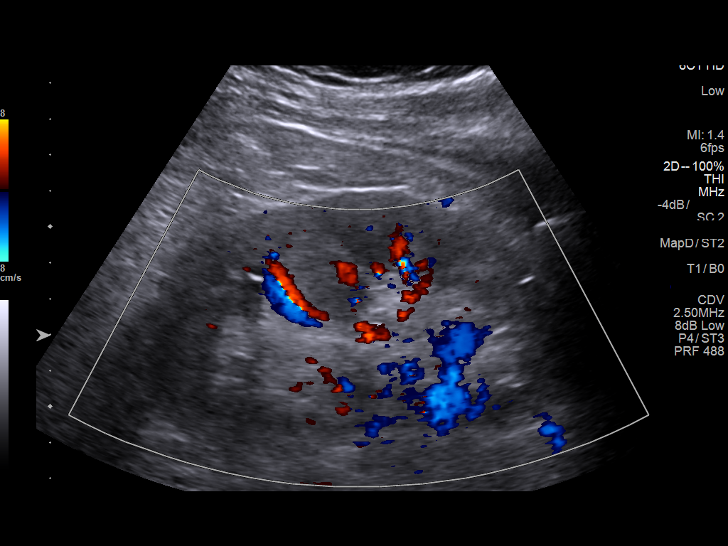
[im 24/32]
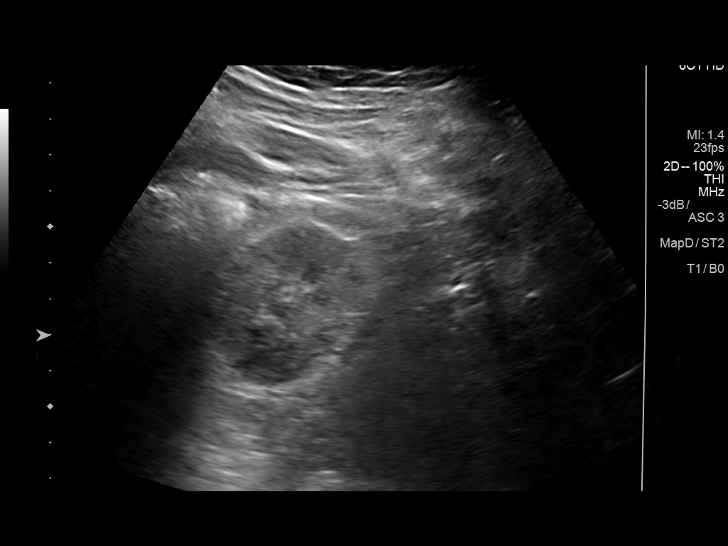
[im 26/32]
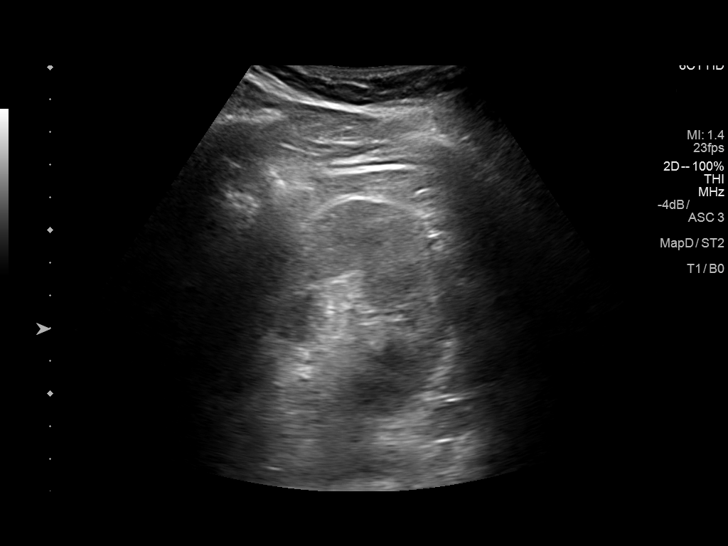
[im 29/32]
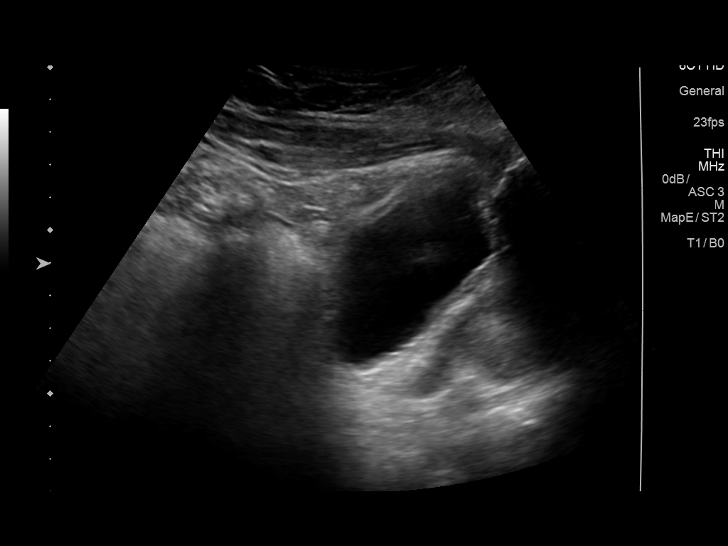
[im 32/32]
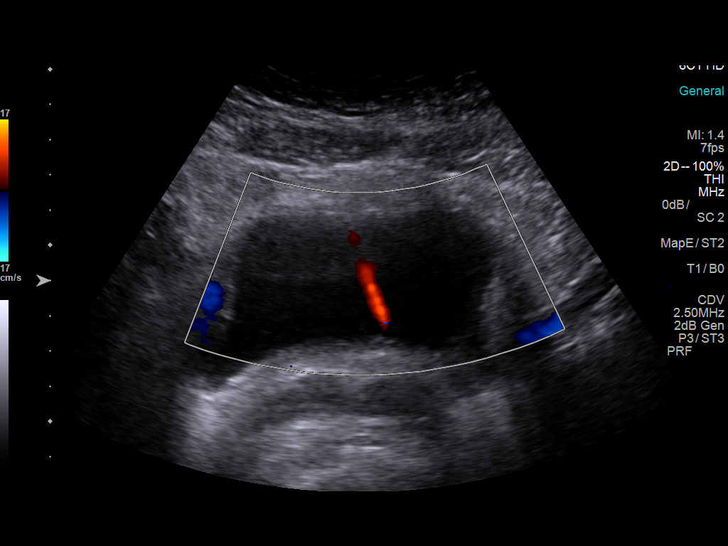

[14 of 25 positions shown; findings below may reference images not displayed]

FINDINGS: Right Kidney:

Length: 13.9 cm. Normal cortical thickness. Mildly increased
cortical echogenicity. No mass, hydronephrosis or shadowing
calcification.

Left Kidney:

Length: 12.3 cm. Normal cortical thickness. Mildly increased
cortical echogenicity. No mass, hydronephrosis or shadowing
calcification.

Bladder:

Normal appearance.  BILATERAL ureteral jets noted.
IMPRESSION: Medical renal disease changes of both kidneys without mass or
hydronephrosis.

## 2018-03-13 IMAGING — DX DG CHEST 2V
2 series · 2 of 2 positions shown · non-contrast
Comparison: 11/09/2016

CLINICAL DATA: Shortness of breath, cough

EXAM:
CHEST  2 VIEW

[chest pa]
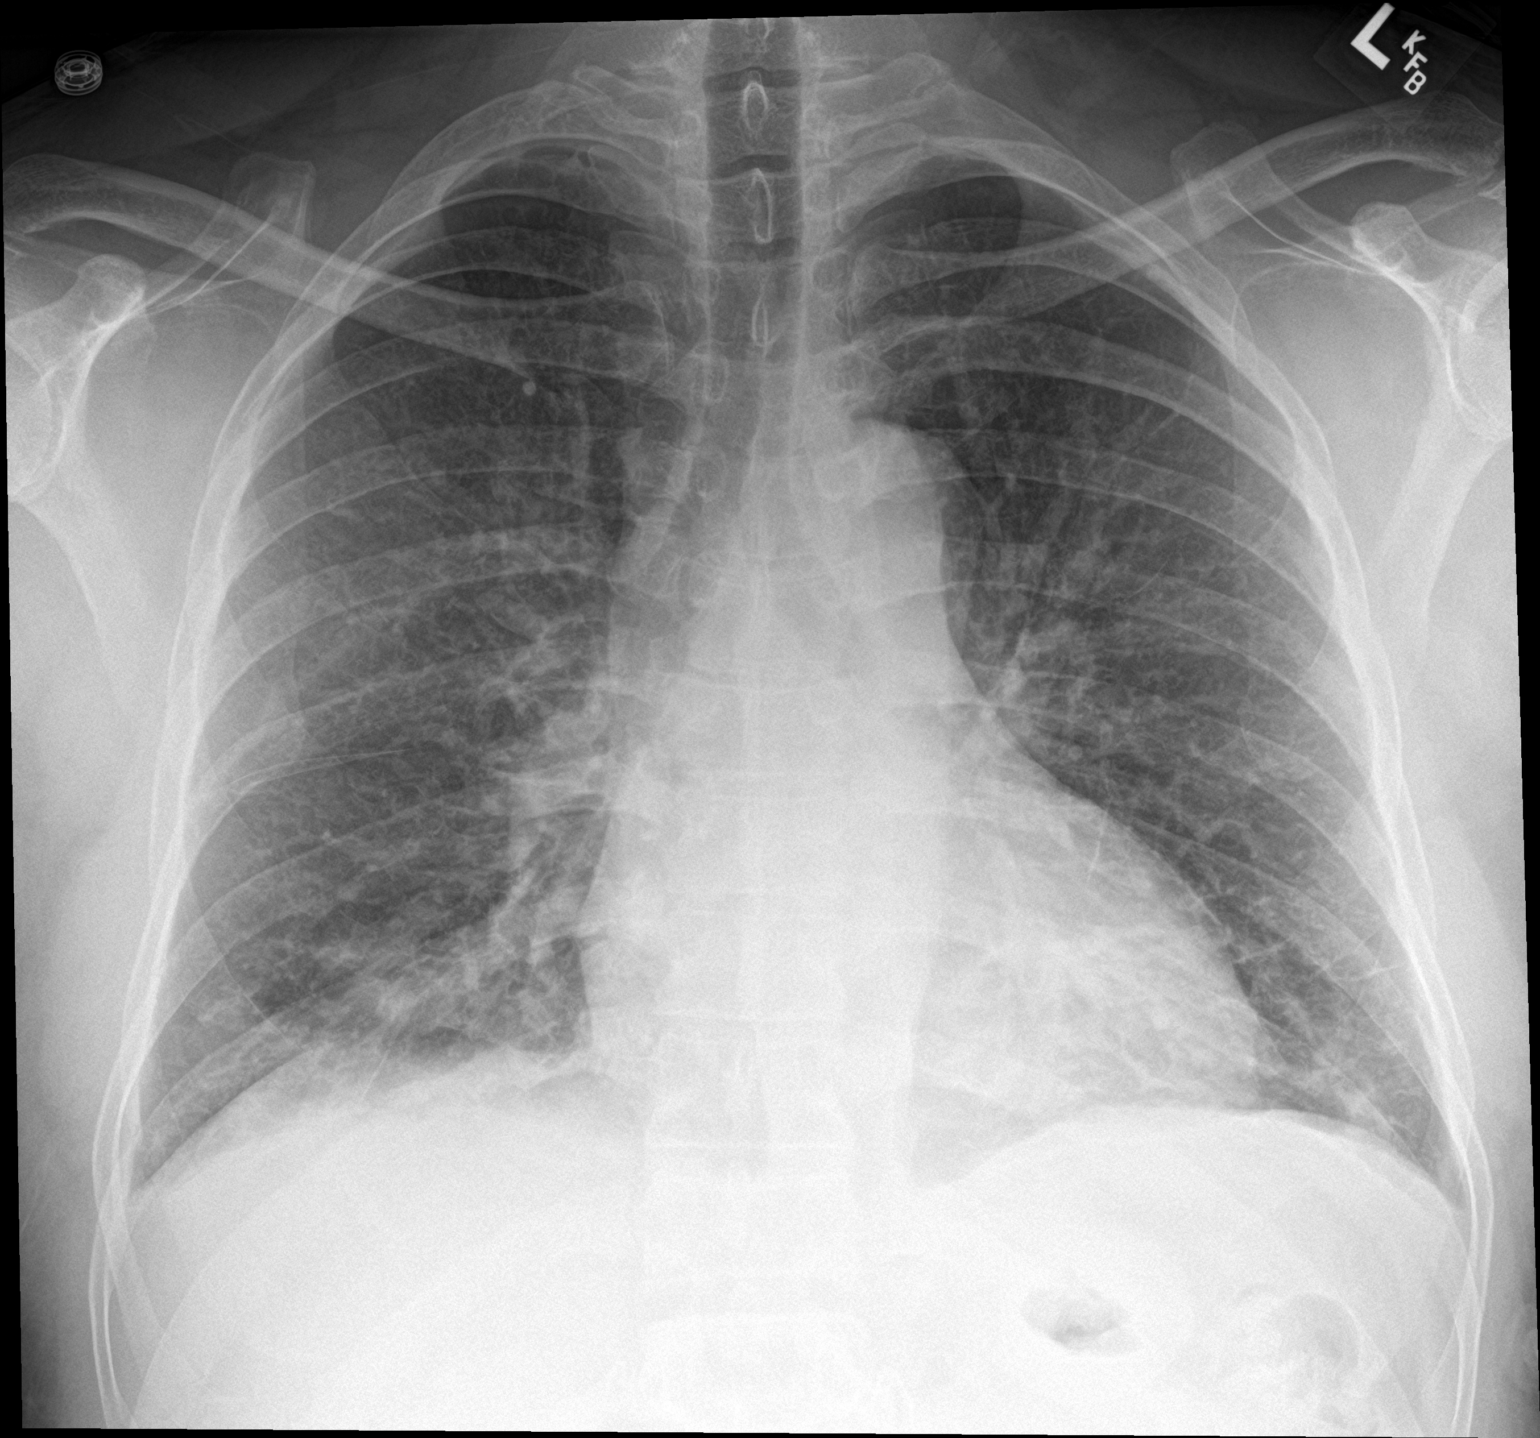

[chest lat]
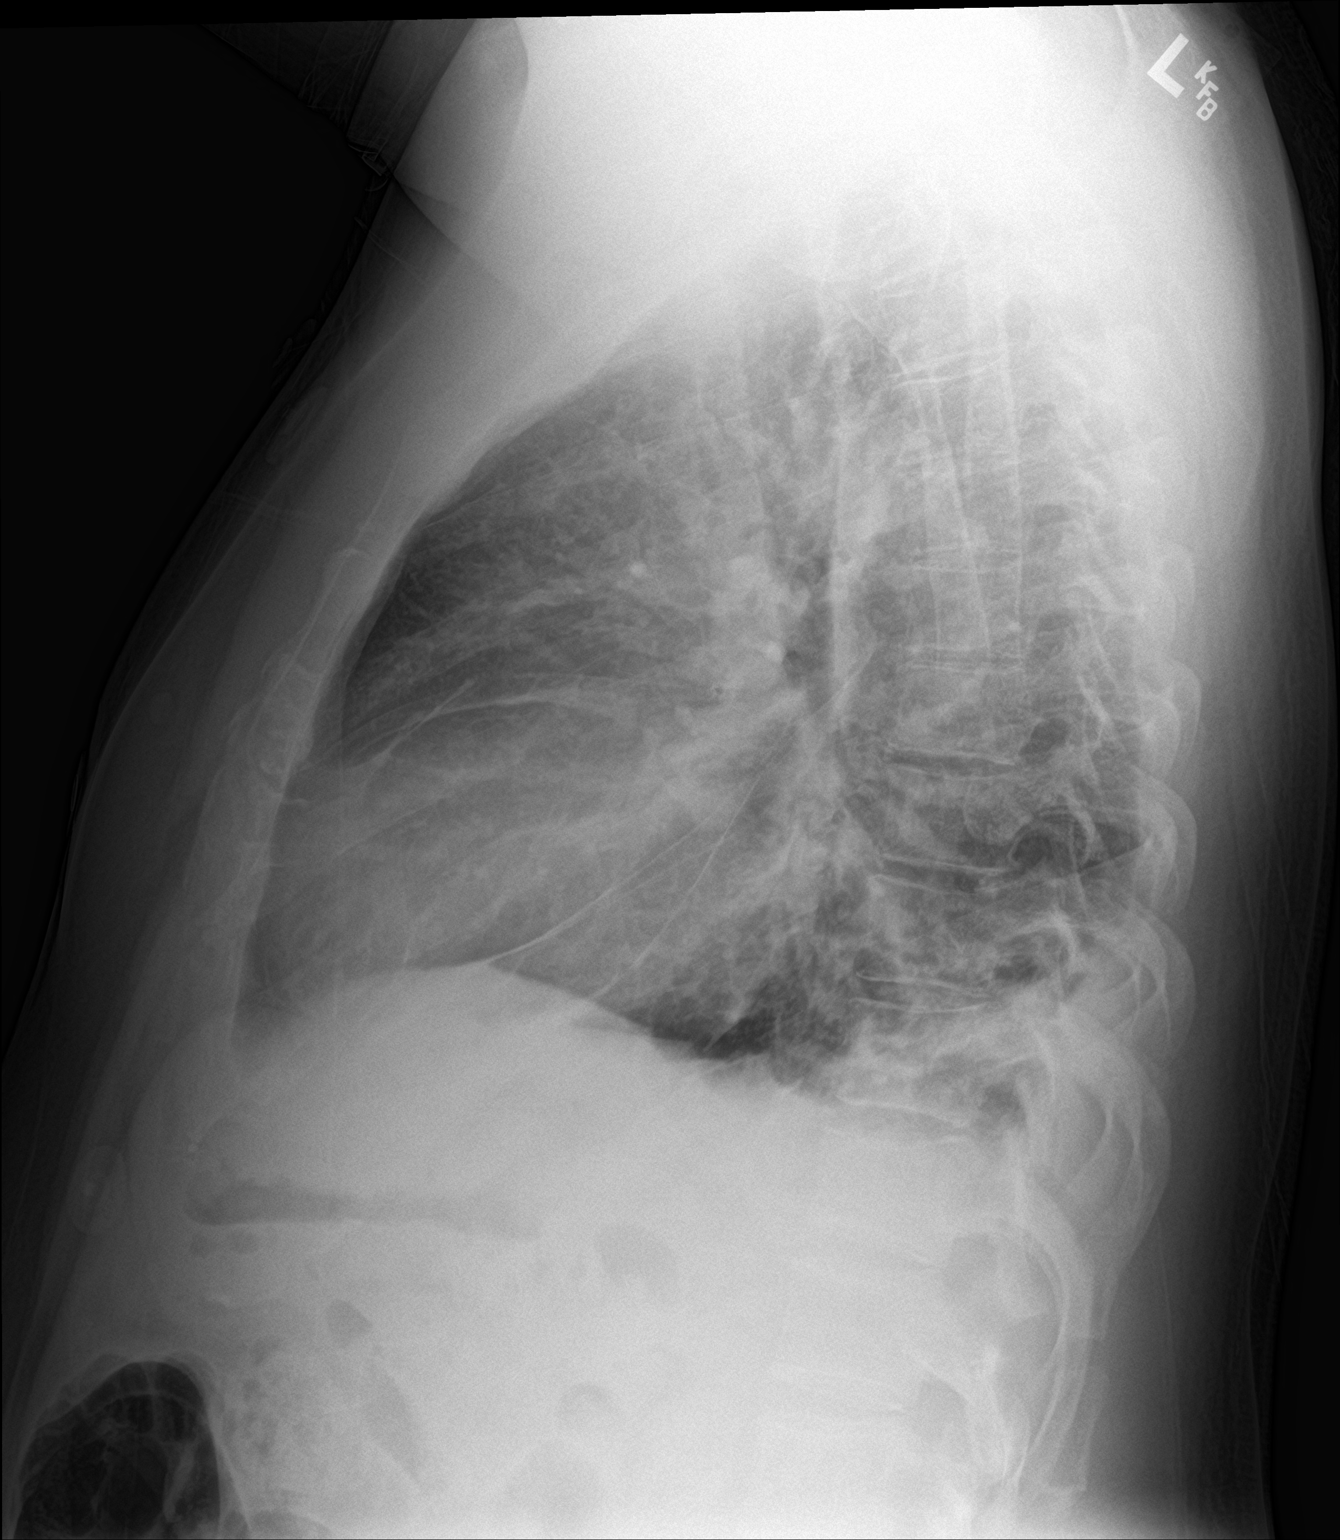

[2 of 2 positions shown; findings below may reference images not displayed]

FINDINGS: Mild patchy bilateral lower lobe opacities. Possible right
suprahilar opacity. Suspected trace bilateral pleural effusions.

This appearance is worrisome for multifocal pneumonia, although mild
interstitial edema is considered less likely.

The heart is normal in size.
IMPRESSION: Multifocal patchy opacities, lower lobe predominant, suspicious for
multifocal pneumonia. Mild interstitial edema is considered less
likely.

Trace bilateral pleural effusions.

## 2018-09-14 IMAGING — US US RENAL
1 series · 14 of 25 positions shown · non-contrast
Comparison: 09/26/2016

CLINICAL DATA: Hypertension and diabetes.

EXAM:
RENAL / URINARY TRACT ULTRASOUND COMPLETE

[Series 1: us renal · 0.26mm/px · 14 of 28 slices shown]
[im 1/28]
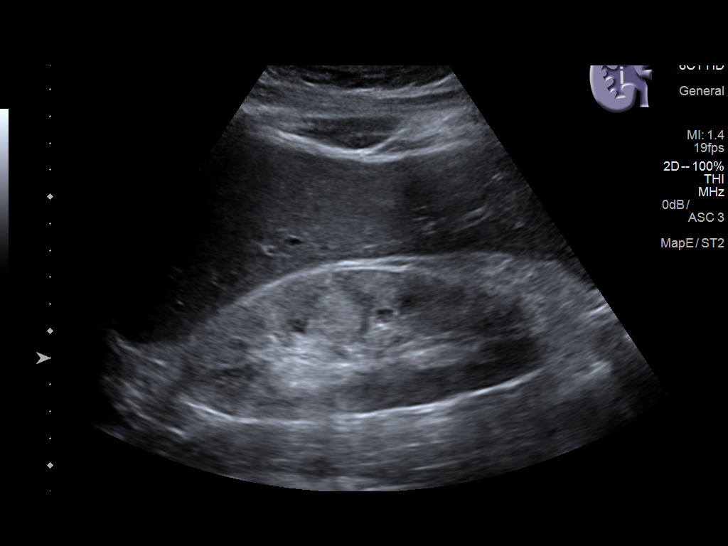
[im 3/28]
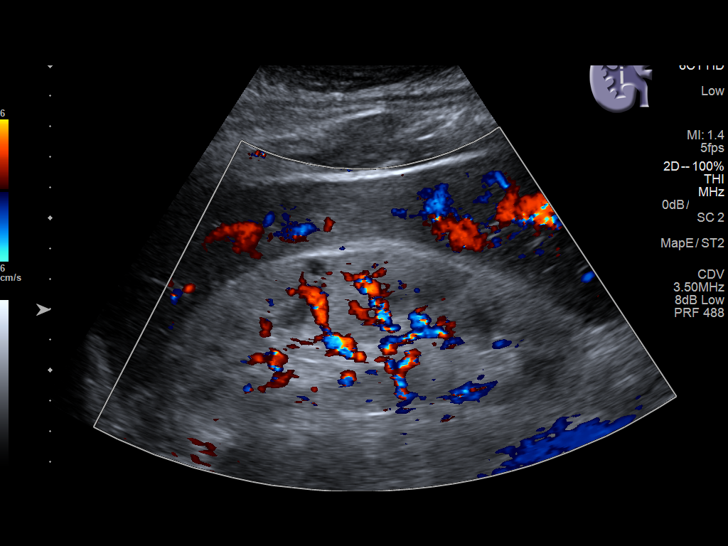
[im 5/28]
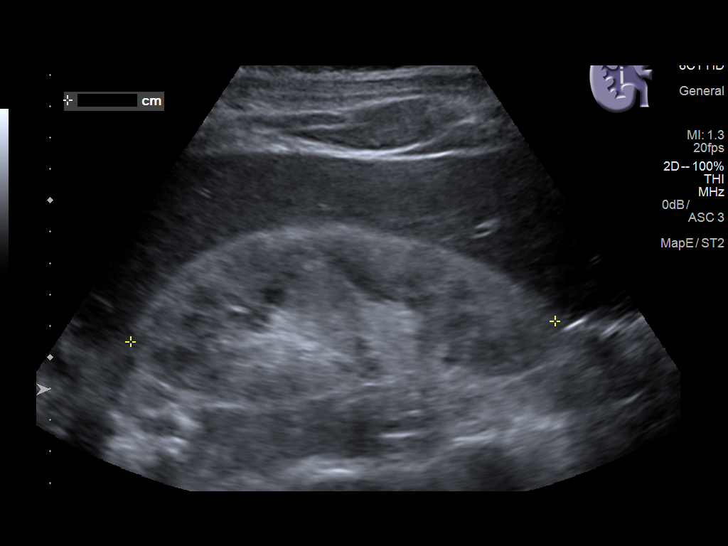
[im 7/28]
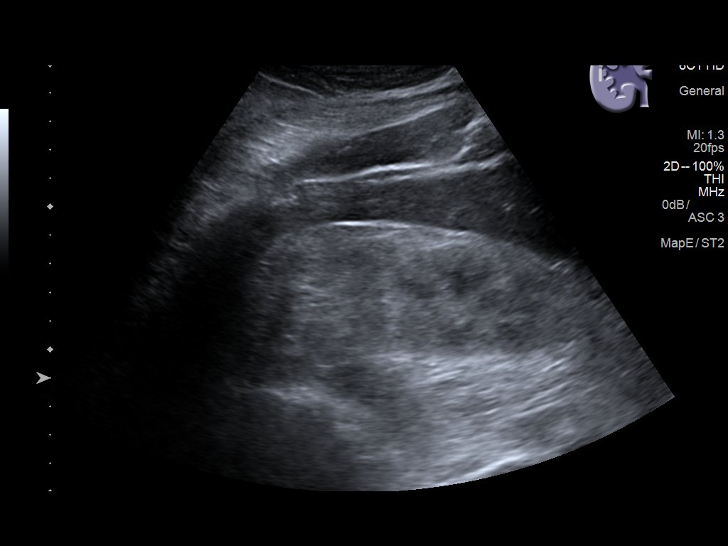
[im 10/28]
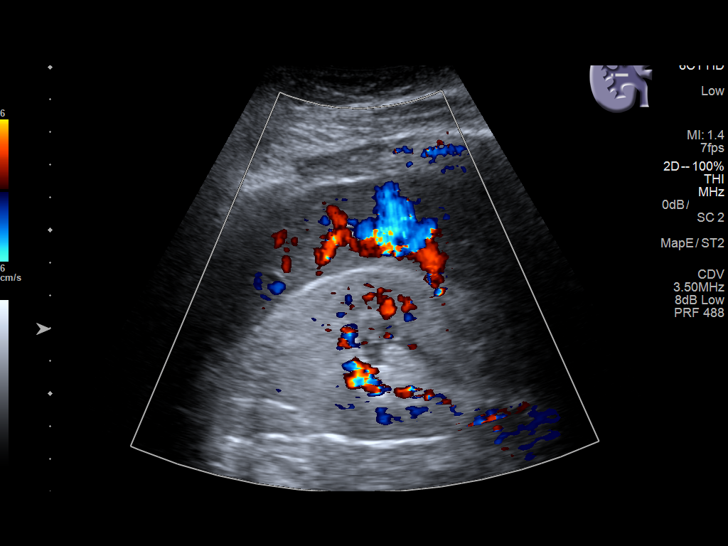
[im 11/28]
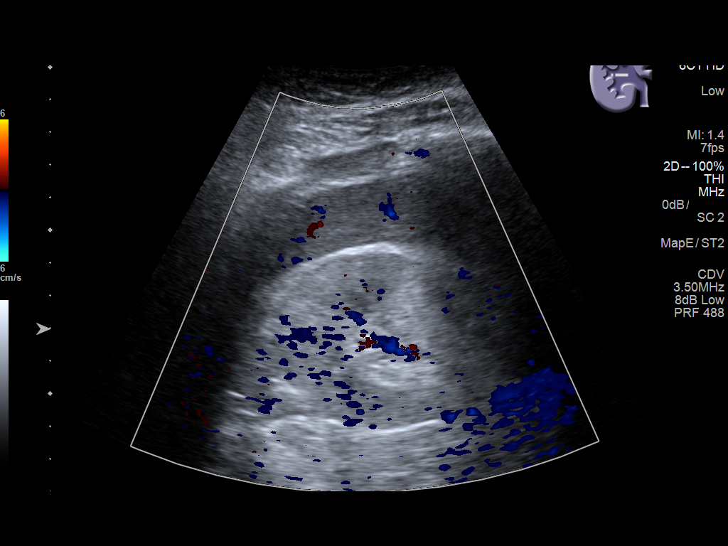
[im 13/28]
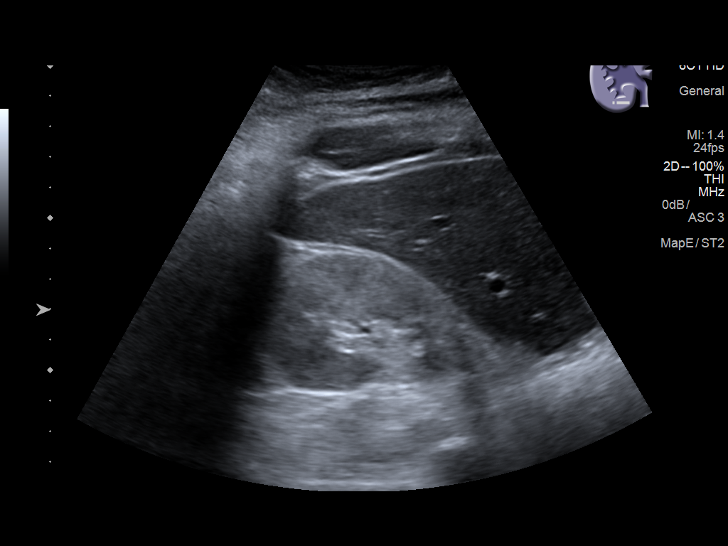
[im 15/28]
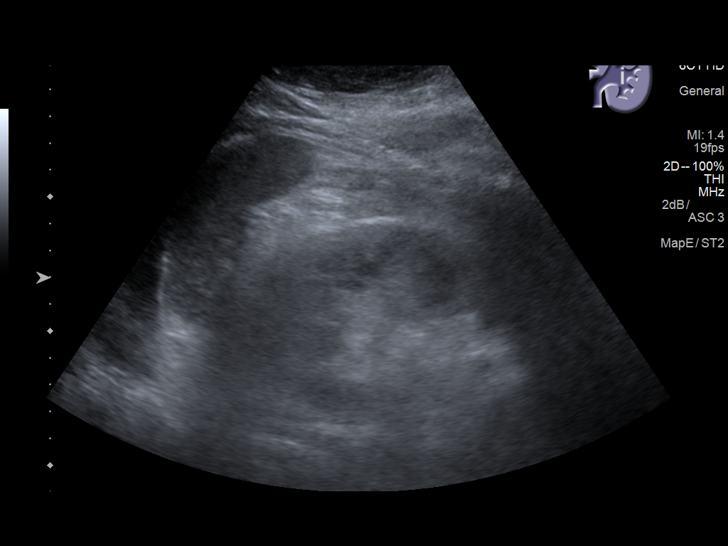
[im 17/28]
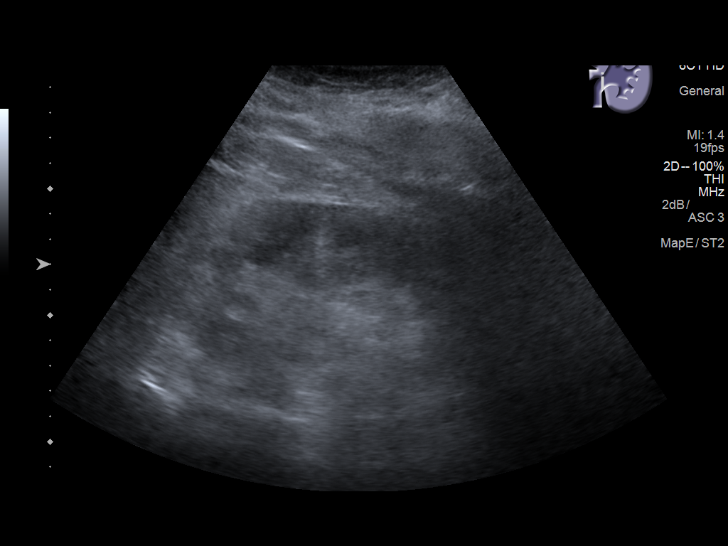
[im 19/28]
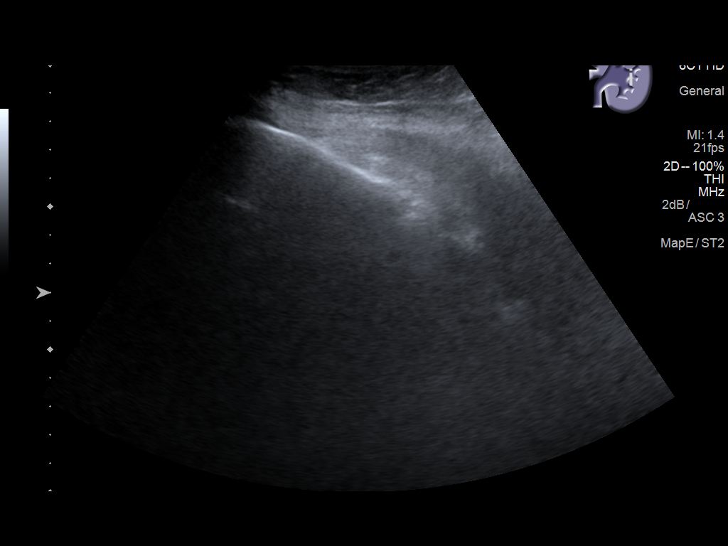
[im 21/28]
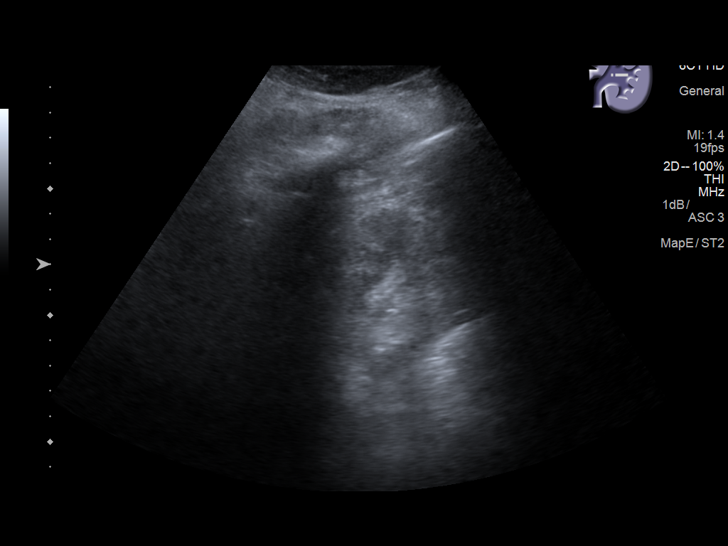
[im 23/28]
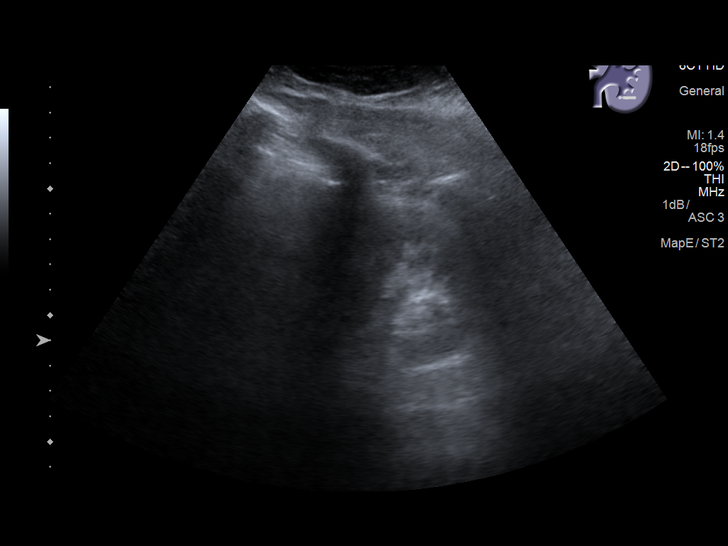
[im 25/28]
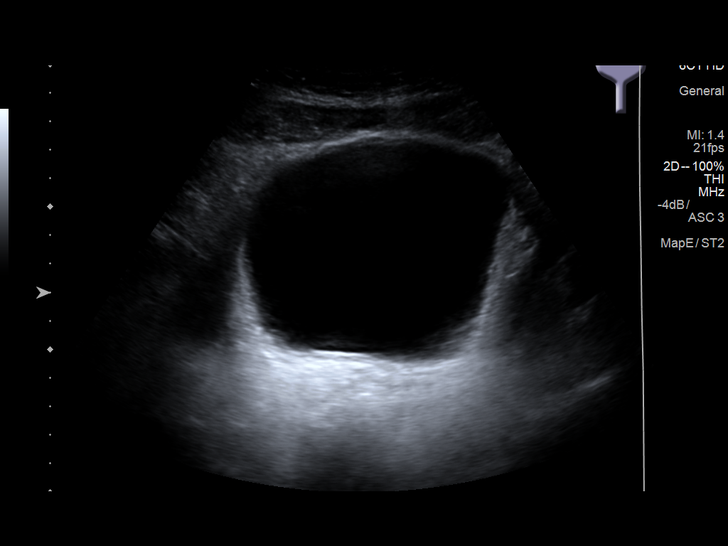
[im 28/28]
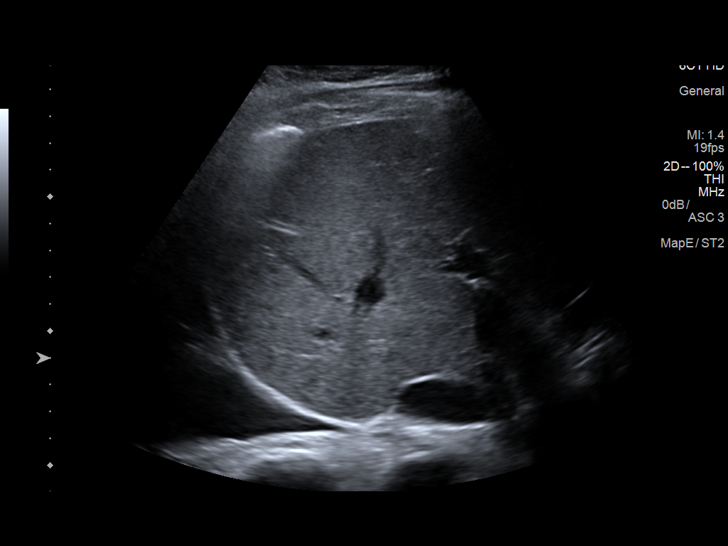

[14 of 25 positions shown; findings below may reference images not displayed]

FINDINGS: Right Kidney:

Length: 13.5 cm. Increased echogenicity in the right kidney.
Negative for hydronephrosis. Question a 0.7 cm hypoechoic cyst in
the interpolar region but this has minimally changed..

Left Kidney:

Length: 13.4 cm. Left kidney is poorly visualized. Negative for
hydronephrosis.

Bladder:

Appears normal for degree of bladder distention.

Other: Bilateral pleural effusions.
IMPRESSION: Increased echogenicity in the right kidney. Findings are suggestive
for chronic medical renal disease. Limited evaluation of the left
kidney.

Negative for hydronephrosis.

Pleural effusions.

Question a small cyst in the right kidney.
